# Patient Record
Sex: Male | Born: 1935 | Race: White | Hispanic: No | Marital: Married | Smoking: Former smoker
Health system: Southern US, Community
[De-identification: ages and names within clinical notes are randomized; demographics above are authoritative.]

## PROBLEM LIST (undated history)

## (undated) DIAGNOSIS — E785 Hyperlipidemia, unspecified: Secondary | ICD-10-CM

## (undated) DIAGNOSIS — I10 Essential (primary) hypertension: Secondary | ICD-10-CM

## (undated) DIAGNOSIS — K5790 Diverticulosis of intestine, part unspecified, without perforation or abscess without bleeding: Secondary | ICD-10-CM

## (undated) DIAGNOSIS — F32A Depression, unspecified: Secondary | ICD-10-CM

## (undated) DIAGNOSIS — K219 Gastro-esophageal reflux disease without esophagitis: Secondary | ICD-10-CM

## (undated) DIAGNOSIS — F329 Major depressive disorder, single episode, unspecified: Secondary | ICD-10-CM

## (undated) DIAGNOSIS — I2510 Atherosclerotic heart disease of native coronary artery without angina pectoris: Secondary | ICD-10-CM

## (undated) DIAGNOSIS — J45909 Unspecified asthma, uncomplicated: Secondary | ICD-10-CM

## (undated) DIAGNOSIS — M069 Rheumatoid arthritis, unspecified: Secondary | ICD-10-CM

## (undated) DIAGNOSIS — G259 Extrapyramidal and movement disorder, unspecified: Secondary | ICD-10-CM

## (undated) DIAGNOSIS — D649 Anemia, unspecified: Secondary | ICD-10-CM

## (undated) HISTORY — PX: CARPAL TUNNEL RELEASE: SHX101

## (undated) HISTORY — PX: KNEE SURGERY: SHX244

## (undated) HISTORY — PX: TONSILLECTOMY: SUR1361

## (undated) HISTORY — DX: Rheumatoid arthritis, unspecified: M06.9

## (undated) HISTORY — DX: Gastro-esophageal reflux disease without esophagitis: K21.9

## (undated) HISTORY — DX: Hyperlipidemia, unspecified: E78.5

## (undated) HISTORY — PX: CARDIAC CATHETERIZATION: SHX172

## (undated) HISTORY — DX: Essential (primary) hypertension: I10

## (undated) HISTORY — DX: Diverticulosis of intestine, part unspecified, without perforation or abscess without bleeding: K57.90

## (undated) HISTORY — DX: Atherosclerotic heart disease of native coronary artery without angina pectoris: I25.10

## (undated) HISTORY — DX: Anemia, unspecified: D64.9

## (undated) HISTORY — PX: TRIGGER FINGER RELEASE: SHX641

## (undated) HISTORY — DX: Extrapyramidal and movement disorder, unspecified: G25.9

## (undated) HISTORY — PX: PROSTATE SURGERY: SHX751

---

## 1991-07-15 ENCOUNTER — Encounter: Payer: Self-pay | Admitting: Internal Medicine

## 1991-07-16 ENCOUNTER — Encounter: Payer: Self-pay | Admitting: Internal Medicine

## 1993-05-29 HISTORY — PX: BACK SURGERY: SHX140

## 1994-04-05 ENCOUNTER — Encounter: Payer: Self-pay | Admitting: Internal Medicine

## 1995-05-30 HISTORY — PX: ANGIOPLASTY: SHX39

## 1998-04-01 ENCOUNTER — Other Ambulatory Visit: Admission: RE | Admit: 1998-04-01 | Discharge: 1998-04-01 | Payer: Self-pay | Admitting: Gastroenterology

## 2002-05-29 DIAGNOSIS — K5790 Diverticulosis of intestine, part unspecified, without perforation or abscess without bleeding: Secondary | ICD-10-CM

## 2002-05-29 HISTORY — DX: Diverticulosis of intestine, part unspecified, without perforation or abscess without bleeding: K57.90

## 2002-05-29 HISTORY — PX: COLONOSCOPY: SHX174

## 2002-11-27 ENCOUNTER — Encounter (INDEPENDENT_AMBULATORY_CARE_PROVIDER_SITE_OTHER): Payer: Self-pay | Admitting: Gastroenterology

## 2003-05-30 HISTORY — PX: SHOULDER SURGERY: SHX246

## 2004-04-13 ENCOUNTER — Ambulatory Visit: Payer: Self-pay | Admitting: Internal Medicine

## 2004-06-03 ENCOUNTER — Ambulatory Visit: Payer: Self-pay | Admitting: Internal Medicine

## 2004-08-31 ENCOUNTER — Ambulatory Visit: Payer: Self-pay | Admitting: Internal Medicine

## 2004-11-16 ENCOUNTER — Ambulatory Visit: Payer: Self-pay | Admitting: Internal Medicine

## 2004-12-20 ENCOUNTER — Ambulatory Visit: Payer: Self-pay | Admitting: Internal Medicine

## 2005-01-11 ENCOUNTER — Ambulatory Visit: Payer: Self-pay | Admitting: Internal Medicine

## 2005-02-13 ENCOUNTER — Ambulatory Visit: Payer: Self-pay | Admitting: Internal Medicine

## 2005-02-14 ENCOUNTER — Ambulatory Visit: Payer: Self-pay

## 2005-02-15 ENCOUNTER — Ambulatory Visit: Payer: Self-pay

## 2005-03-15 ENCOUNTER — Ambulatory Visit: Payer: Self-pay | Admitting: Internal Medicine

## 2005-03-22 ENCOUNTER — Ambulatory Visit: Payer: Self-pay | Admitting: Internal Medicine

## 2005-03-27 ENCOUNTER — Ambulatory Visit: Payer: Self-pay | Admitting: Internal Medicine

## 2005-04-11 ENCOUNTER — Ambulatory Visit: Payer: Self-pay | Admitting: Internal Medicine

## 2005-07-21 ENCOUNTER — Ambulatory Visit: Payer: Self-pay | Admitting: Internal Medicine

## 2006-01-04 ENCOUNTER — Ambulatory Visit: Payer: Self-pay | Admitting: Internal Medicine

## 2006-04-11 ENCOUNTER — Ambulatory Visit: Payer: Self-pay | Admitting: Internal Medicine

## 2006-07-24 ENCOUNTER — Ambulatory Visit: Payer: Self-pay | Admitting: Internal Medicine

## 2006-07-24 LAB — CONVERTED CEMR LAB
BUN: 14 mg/dL (ref 6–23)
Cholesterol: 137 mg/dL (ref 0–200)
Hgb A1c MFr Bld: 6.1 % — ABNORMAL HIGH (ref 4.6–6.0)
Microalb, Ur: 0.5 mg/dL (ref 0.0–1.9)
Potassium: 4.7 meq/L (ref 3.5–5.1)
VLDL: 22 mg/dL (ref 0–40)

## 2006-07-25 ENCOUNTER — Encounter (INDEPENDENT_AMBULATORY_CARE_PROVIDER_SITE_OTHER): Payer: Self-pay | Admitting: *Deleted

## 2006-12-12 ENCOUNTER — Ambulatory Visit: Payer: Self-pay | Admitting: Internal Medicine

## 2006-12-14 ENCOUNTER — Encounter (INDEPENDENT_AMBULATORY_CARE_PROVIDER_SITE_OTHER): Payer: Self-pay | Admitting: *Deleted

## 2006-12-14 LAB — CONVERTED CEMR LAB: Hgb A1c MFr Bld: 6.1 % — ABNORMAL HIGH (ref 4.6–6.0)

## 2006-12-19 ENCOUNTER — Ambulatory Visit: Payer: Self-pay | Admitting: Internal Medicine

## 2006-12-31 ENCOUNTER — Ambulatory Visit: Payer: Self-pay | Admitting: Internal Medicine

## 2007-01-31 ENCOUNTER — Ambulatory Visit: Payer: Self-pay

## 2007-07-05 ENCOUNTER — Telehealth (INDEPENDENT_AMBULATORY_CARE_PROVIDER_SITE_OTHER): Payer: Self-pay | Admitting: *Deleted

## 2007-07-22 ENCOUNTER — Encounter (INDEPENDENT_AMBULATORY_CARE_PROVIDER_SITE_OTHER): Payer: Self-pay | Admitting: *Deleted

## 2007-07-22 DIAGNOSIS — Z860100 Personal history of colon polyps, unspecified: Secondary | ICD-10-CM | POA: Insufficient documentation

## 2007-07-22 DIAGNOSIS — J309 Allergic rhinitis, unspecified: Secondary | ICD-10-CM | POA: Insufficient documentation

## 2007-07-22 DIAGNOSIS — E785 Hyperlipidemia, unspecified: Secondary | ICD-10-CM | POA: Insufficient documentation

## 2007-07-22 DIAGNOSIS — I2510 Atherosclerotic heart disease of native coronary artery without angina pectoris: Secondary | ICD-10-CM | POA: Insufficient documentation

## 2007-07-22 DIAGNOSIS — D539 Nutritional anemia, unspecified: Secondary | ICD-10-CM | POA: Insufficient documentation

## 2007-07-22 DIAGNOSIS — K219 Gastro-esophageal reflux disease without esophagitis: Secondary | ICD-10-CM | POA: Insufficient documentation

## 2007-07-22 DIAGNOSIS — M1990 Unspecified osteoarthritis, unspecified site: Secondary | ICD-10-CM | POA: Insufficient documentation

## 2007-07-22 DIAGNOSIS — J45909 Unspecified asthma, uncomplicated: Secondary | ICD-10-CM | POA: Insufficient documentation

## 2007-07-22 DIAGNOSIS — Z87898 Personal history of other specified conditions: Secondary | ICD-10-CM | POA: Insufficient documentation

## 2007-12-19 ENCOUNTER — Ambulatory Visit: Payer: Self-pay | Admitting: Internal Medicine

## 2007-12-19 ENCOUNTER — Telehealth (INDEPENDENT_AMBULATORY_CARE_PROVIDER_SITE_OTHER): Payer: Self-pay | Admitting: *Deleted

## 2007-12-19 DIAGNOSIS — G250 Essential tremor: Secondary | ICD-10-CM | POA: Insufficient documentation

## 2007-12-19 DIAGNOSIS — R7301 Impaired fasting glucose: Secondary | ICD-10-CM | POA: Insufficient documentation

## 2007-12-24 ENCOUNTER — Encounter (INDEPENDENT_AMBULATORY_CARE_PROVIDER_SITE_OTHER): Payer: Self-pay | Admitting: *Deleted

## 2007-12-24 ENCOUNTER — Ambulatory Visit: Payer: Self-pay | Admitting: Internal Medicine

## 2007-12-24 LAB — CONVERTED CEMR LAB: OCCULT 1: NEGATIVE

## 2008-03-16 ENCOUNTER — Ambulatory Visit: Payer: Self-pay | Admitting: Internal Medicine

## 2008-03-19 ENCOUNTER — Ambulatory Visit (HOSPITAL_COMMUNITY): Admission: RE | Admit: 2008-03-19 | Discharge: 2008-03-19 | Payer: Self-pay | Admitting: Internal Medicine

## 2008-04-01 ENCOUNTER — Ambulatory Visit: Payer: Self-pay | Admitting: Internal Medicine

## 2008-04-15 ENCOUNTER — Ambulatory Visit: Payer: Self-pay | Admitting: Internal Medicine

## 2008-04-15 DIAGNOSIS — R059 Cough, unspecified: Secondary | ICD-10-CM | POA: Insufficient documentation

## 2008-04-15 DIAGNOSIS — R05 Cough: Secondary | ICD-10-CM

## 2008-04-15 DIAGNOSIS — R1313 Dysphagia, pharyngeal phase: Secondary | ICD-10-CM | POA: Insufficient documentation

## 2008-04-17 ENCOUNTER — Encounter (INDEPENDENT_AMBULATORY_CARE_PROVIDER_SITE_OTHER): Payer: Self-pay | Admitting: *Deleted

## 2008-04-17 LAB — CONVERTED CEMR LAB
Basophils Absolute: 0 10*3/uL (ref 0.0–0.1)
Eosinophils Relative: 8.4 % — ABNORMAL HIGH (ref 0.0–5.0)
HCT: 38.6 % — ABNORMAL LOW (ref 39.0–52.0)
Lymphocytes Relative: 33.7 % (ref 12.0–46.0)
MCV: 94.6 fL (ref 78.0–100.0)
Monocytes Relative: 12.3 % — ABNORMAL HIGH (ref 3.0–12.0)
Neutro Abs: 2.9 10*3/uL (ref 1.4–7.7)
Platelets: 209 10*3/uL (ref 150–400)
RBC: 4.08 M/uL — ABNORMAL LOW (ref 4.22–5.81)
RDW: 12.2 % (ref 11.5–14.6)
Transferrin: 219.6 mg/dL (ref >=212.0)
WBC: 6.4 10*3/uL (ref 4.5–10.5)

## 2008-04-29 ENCOUNTER — Ambulatory Visit (HOSPITAL_COMMUNITY): Admission: RE | Admit: 2008-04-29 | Discharge: 2008-04-29 | Payer: Self-pay | Admitting: Internal Medicine

## 2008-04-29 ENCOUNTER — Encounter: Payer: Self-pay | Admitting: Internal Medicine

## 2008-05-29 DIAGNOSIS — D649 Anemia, unspecified: Secondary | ICD-10-CM

## 2008-05-29 HISTORY — DX: Anemia, unspecified: D64.9

## 2008-07-13 ENCOUNTER — Ambulatory Visit: Payer: Self-pay | Admitting: Internal Medicine

## 2008-07-13 DIAGNOSIS — F32A Depression, unspecified: Secondary | ICD-10-CM | POA: Insufficient documentation

## 2008-07-13 DIAGNOSIS — F329 Major depressive disorder, single episode, unspecified: Secondary | ICD-10-CM

## 2008-07-14 ENCOUNTER — Encounter (INDEPENDENT_AMBULATORY_CARE_PROVIDER_SITE_OTHER): Payer: Self-pay | Admitting: *Deleted

## 2008-07-15 LAB — CONVERTED CEMR LAB
Albumin: 4.3 g/dL (ref 3.5–5.2)
Cholesterol: 146 mg/dL (ref 0–200)
Free T4: 0.8 ng/dL (ref 0.6–1.6)
LDL Cholesterol: 77 mg/dL (ref 0–99)
Rhuematoid fact SerPl-aCnc: <20 intl units/mL — ABNORMAL LOW (ref 0.0–20.0)
TSH: 2.35 microintl units/mL (ref 0.35–5.50)
Total Protein: 7.6 g/dL (ref 6.0–8.3)
Uric Acid, Serum: 5.6 mg/dL (ref 4.0–7.8)
VLDL: 23 mg/dL (ref 0–40)

## 2008-07-16 ENCOUNTER — Encounter (INDEPENDENT_AMBULATORY_CARE_PROVIDER_SITE_OTHER): Payer: Self-pay | Admitting: *Deleted

## 2008-07-28 ENCOUNTER — Encounter: Payer: Self-pay | Admitting: Internal Medicine

## 2009-02-18 ENCOUNTER — Ambulatory Visit: Payer: Self-pay | Admitting: Internal Medicine

## 2009-02-19 ENCOUNTER — Encounter (INDEPENDENT_AMBULATORY_CARE_PROVIDER_SITE_OTHER): Payer: Self-pay | Admitting: *Deleted

## 2009-02-19 LAB — CONVERTED CEMR LAB: Hgb A1c MFr Bld: 6 % (ref 4.6–6.5)

## 2009-03-30 ENCOUNTER — Encounter (INDEPENDENT_AMBULATORY_CARE_PROVIDER_SITE_OTHER): Payer: Self-pay | Admitting: *Deleted

## 2009-04-02 ENCOUNTER — Telehealth (INDEPENDENT_AMBULATORY_CARE_PROVIDER_SITE_OTHER): Payer: Self-pay | Admitting: *Deleted

## 2009-04-26 ENCOUNTER — Ambulatory Visit: Payer: Self-pay | Admitting: Internal Medicine

## 2009-04-26 LAB — CONVERTED CEMR LAB
ALT: 18 units/L (ref 0–53)
AST: 21 units/L (ref 0–37)
Alkaline Phosphatase: 33 units/L — ABNORMAL LOW (ref 39–117)
Basophils Absolute: 0.1 10*3/uL (ref 0.0–0.1)
Basophils Relative: 0.9 % (ref 0.0–3.0)
Calcium: 9.4 mg/dL (ref 8.4–10.5)
Chloride: 104 meq/L (ref 96–112)
Eosinophils Relative: 7.1 % — ABNORMAL HIGH (ref 0.0–5.0)
Hemoglobin: 13 g/dL (ref 13.0–17.0)
Lymphocytes Relative: 23 % (ref 12.0–46.0)
MCV: 95.3 fL (ref 78.0–100.0)
Magnesium: 2.2 mg/dL (ref 1.5–2.5)
Monocytes Absolute: 0.8 10*3/uL (ref 0.1–1.0)
Monocytes Relative: 10 % (ref 3.0–12.0)
Potassium: 4.5 meq/L (ref 3.5–5.1)
RDW: 12 % (ref 11.5–14.6)
Sodium: 138 meq/L (ref 135–145)
Total Bilirubin: 0.9 mg/dL (ref 0.3–1.2)
Total Protein: 7.3 g/dL (ref 6.0–8.3)

## 2009-04-29 ENCOUNTER — Ambulatory Visit: Payer: Self-pay | Admitting: Internal Medicine

## 2009-04-29 DIAGNOSIS — M2550 Pain in unspecified joint: Secondary | ICD-10-CM | POA: Insufficient documentation

## 2009-04-29 DIAGNOSIS — K279 Peptic ulcer, site unspecified, unspecified as acute or chronic, without hemorrhage or perforation: Secondary | ICD-10-CM | POA: Insufficient documentation

## 2009-04-30 ENCOUNTER — Ambulatory Visit: Payer: Self-pay | Admitting: Internal Medicine

## 2009-05-03 ENCOUNTER — Encounter (INDEPENDENT_AMBULATORY_CARE_PROVIDER_SITE_OTHER): Payer: Self-pay | Admitting: *Deleted

## 2009-05-04 DIAGNOSIS — I119 Hypertensive heart disease without heart failure: Secondary | ICD-10-CM | POA: Insufficient documentation

## 2009-05-04 DIAGNOSIS — I10 Essential (primary) hypertension: Secondary | ICD-10-CM | POA: Insufficient documentation

## 2009-05-05 ENCOUNTER — Encounter (INDEPENDENT_AMBULATORY_CARE_PROVIDER_SITE_OTHER): Payer: Self-pay | Admitting: *Deleted

## 2009-05-05 ENCOUNTER — Ambulatory Visit: Payer: Self-pay | Admitting: Internal Medicine

## 2009-05-05 LAB — CONVERTED CEMR LAB
OCCULT 1: NEGATIVE
OCCULT 2: NEGATIVE
OCCULT 3: NEGATIVE

## 2009-05-06 ENCOUNTER — Ambulatory Visit: Payer: Self-pay | Admitting: Internal Medicine

## 2009-05-07 ENCOUNTER — Encounter (INDEPENDENT_AMBULATORY_CARE_PROVIDER_SITE_OTHER): Payer: Self-pay | Admitting: *Deleted

## 2009-05-07 LAB — CONVERTED CEMR LAB
Basophils Relative: 0.7 % (ref 0.0–3.0)
Eosinophils Absolute: 0.6 10*3/uL (ref 0.0–0.7)
Eosinophils Relative: 6.5 % — ABNORMAL HIGH (ref 0.0–5.0)
Folate: 19 ng/mL
Hemoglobin: 12.9 g/dL — ABNORMAL LOW (ref 13.0–17.0)
Lymphocytes Relative: 26.8 % (ref 12.0–46.0)
MCV: 95 fL (ref 78.0–100.0)
Monocytes Relative: 10.6 % (ref 3.0–12.0)
Neutrophils Relative %: 55.4 % (ref 43.0–77.0)
Platelets: 257 10*3/uL (ref 150.0–400.0)
Saturation Ratios: 26.1 % (ref 20.0–50.0)
Transferrin: 211.1 mg/dL — ABNORMAL LOW (ref 212.0–360.0)

## 2009-05-11 ENCOUNTER — Telehealth (INDEPENDENT_AMBULATORY_CARE_PROVIDER_SITE_OTHER): Payer: Self-pay | Admitting: *Deleted

## 2009-05-12 ENCOUNTER — Encounter: Payer: Self-pay | Admitting: Cardiology

## 2009-05-12 ENCOUNTER — Ambulatory Visit: Payer: Self-pay | Admitting: Internal Medicine

## 2009-05-12 ENCOUNTER — Encounter (HOSPITAL_COMMUNITY): Admission: RE | Admit: 2009-05-12 | Discharge: 2009-05-26 | Payer: Self-pay | Admitting: Internal Medicine

## 2009-05-12 ENCOUNTER — Ambulatory Visit: Payer: Self-pay

## 2009-05-29 HISTORY — PX: CATARACT EXTRACTION, BILATERAL: SHX1313

## 2009-06-22 ENCOUNTER — Ambulatory Visit: Payer: Self-pay | Admitting: Family

## 2009-06-22 DIAGNOSIS — M2550 Pain in unspecified joint: Secondary | ICD-10-CM | POA: Insufficient documentation

## 2009-06-22 LAB — CONVERTED CEMR LAB
Rhuematoid fact SerPl-aCnc: <20 intl units/mL (ref 0–20)
Sed Rate: 45 mm/hr — ABNORMAL HIGH (ref 0–16)
Uric Acid, Serum: 5 mg/dL (ref 4.0–7.8)

## 2009-06-23 ENCOUNTER — Telehealth (INDEPENDENT_AMBULATORY_CARE_PROVIDER_SITE_OTHER): Payer: Self-pay | Admitting: *Deleted

## 2009-06-23 DIAGNOSIS — M25549 Pain in joints of unspecified hand: Secondary | ICD-10-CM | POA: Insufficient documentation

## 2009-07-06 ENCOUNTER — Ambulatory Visit: Payer: Self-pay | Admitting: Internal Medicine

## 2009-07-07 ENCOUNTER — Telehealth (INDEPENDENT_AMBULATORY_CARE_PROVIDER_SITE_OTHER): Payer: Self-pay | Admitting: *Deleted

## 2009-07-08 ENCOUNTER — Telehealth (INDEPENDENT_AMBULATORY_CARE_PROVIDER_SITE_OTHER): Payer: Self-pay | Admitting: *Deleted

## 2009-07-08 LAB — CONVERTED CEMR LAB
ALT: 26 units/L (ref 0–53)
Albumin: 3.7 g/dL (ref 3.5–5.2)
Alkaline Phosphatase: 40 units/L (ref 39–117)
Total CK: 27 units/L (ref 7–232)
Total Protein: 7.3 g/dL (ref 6.0–8.3)
Vit D, 25-Hydroxy: 56 ng/mL (ref 30–89)

## 2009-07-19 ENCOUNTER — Ambulatory Visit: Payer: Self-pay | Admitting: Internal Medicine

## 2009-08-04 ENCOUNTER — Encounter: Payer: Self-pay | Admitting: Internal Medicine

## 2010-01-11 ENCOUNTER — Ambulatory Visit: Payer: Self-pay | Admitting: Internal Medicine

## 2010-01-11 DIAGNOSIS — M546 Pain in thoracic spine: Secondary | ICD-10-CM | POA: Insufficient documentation

## 2010-01-11 DIAGNOSIS — M069 Rheumatoid arthritis, unspecified: Secondary | ICD-10-CM | POA: Insufficient documentation

## 2010-01-11 DIAGNOSIS — K439 Ventral hernia without obstruction or gangrene: Secondary | ICD-10-CM | POA: Insufficient documentation

## 2010-02-10 ENCOUNTER — Encounter: Payer: Self-pay | Admitting: Internal Medicine

## 2010-06-26 LAB — CONVERTED CEMR LAB
ALT: 21 units/L (ref 0–53)
AST: 26 units/L (ref 0–37)
BUN: 18 mg/dL (ref 6–23)
Basophils Relative: 0.6 % (ref 0.0–3.0)
Bilirubin, Direct: 0.1 mg/dL (ref 0.0–0.3)
Blood in Urine, dipstick: NEGATIVE
Chloride: 104 meq/L (ref 96–112)
Cholesterol: 135 mg/dL (ref 0–200)
GFR calc Af Amer: 77 mL/min
GFR calc non Af Amer: 63 mL/min
HCT: 37.9 % — ABNORMAL LOW (ref 39.0–52.0)
Hemoglobin: 13 g/dL (ref 13.0–17.0)
Hgb A1c MFr Bld: 6.2 % — ABNORMAL HIGH (ref 4.6–6.0)
LDL Cholesterol: 67 mg/dL (ref 0–99)
MCHC: 34.3 g/dL (ref 30.0–36.0)
Microalb Creat Ratio: 2.6 mg/g (ref 0.0–30.0)
Monocytes Relative: 11.8 % (ref 3.0–12.0)
Neutrophils Relative %: 53.7 % (ref 43.0–77.0)
Platelets: 209 10*3/uL (ref 150–400)
Protein, U semiquant: NEGATIVE
RDW: 12.5 % (ref 11.5–14.6)
Specific Gravity, Urine: 1.02
Total Protein: 7 g/dL (ref 6.0–8.3)
VLDL: 21 mg/dL (ref 0–40)
WBC Urine, dipstick: NEGATIVE
WBC: 7.7 10*3/uL (ref 4.5–10.5)
pH: 6

## 2010-06-30 NOTE — Assessment & Plan Note (Signed)
Summary: FOLLOW UP/MHF   Vital Signs:  Patient profile:   76 year old male Height:      70.25 inches Weight:      196 pounds BMI:     28.02 Temp:     98.3 degrees F Pulse rate:   62 / minute Resp:     15 per minute BP sitting:   130 / 68  Vitals Entered By: rachel peeler CC: f/u-pain is worse in hands, throbbing pain from elbow to wrists, pain in left knee   Primary Care Provider:  Marga Melnick, MD  CC:  f/u-pain is worse in hands, throbbing pain from elbow to wrists, and pain in left knee.  History of Present Illness: Arthralgias since pre Christmas, initially in shoulders & L  elbow which affected ability to exercise. Progressive to  diffuse joint pains , especially hands  & wrists. Rx: Indocin from Dr Darrelyn Hillock; he was seen twice  in past 30 days. Oral steroids X 2 helped.Some associated myalgias. Labs reviewed : sed rate 45. PMH OA; his mother had Osteoarthritis also & ? "little rheumatoid".  Allergies: 1)  Sulfa 2)  Pravachol 3)  Zocor 4)  Lipitor  Review of Systems General:  Complains of sweats; denies chills and fever; No significant weight loss. Eyes:  Denies discharge, eye pain, and red eye. GU:  Denies discharge and dysuria. MS:  See HPI; Complains of joint swelling; denies joint redness, low back pain, mid back pain, and thoracic pain.  Physical Exam  General:  in no acute distress; alert,appropriate and cooperative throughout examination; mildly uncomfortable-appearing.   Extremities:  No clubbing, cyanosis, edema  noted with normal full range of motion of all joints.  DJD hand changes. Crepitus of shoulders & knees Skin:  Intact without suspicious lesions or rashes Cervical Nodes:  No lymphadenopathy noted Axillary Nodes:  No palpable lymphadenopathy   Impression & Recommendations:  Problem # 1:  PAIN IN JOINT, MULTIPLE SITES (ICD-719.49)  Orders: Venipuncture (04540) TLB-Sedimentation Rate (ESR) (85652-ESR) TLB-Hepatic/Liver Function Pnl  (80076-HEPATIC)  Problem # 2:  MYALGIA (ICD-729.1)  His updated medication list for this problem includes:    Indomethacin 25 Mg Caps (Indomethacin) .Marland Kitchen... Twice a day    Tylenol With Codeine #3 300-30 Mg Tabs (Acetaminophen-codeine) .Marland Kitchen... 1 q 6 hrs as needed pain  Orders: TLB-CK Total Only(Creatine Kinase/CPK) (82550-CK) TLB-Sedimentation Rate (ESR) (85652-ESR) T-Vitamin D (25-Hydroxy) (98119-14782) Prescription Created Electronically [phone])  Complete Medication List: 1)  Metformin Hcl 500 Mg Tb24 (Metformin hcl) .Marland Kitchen.. 1 by mouth once daily**labs due now** 2)  Paxil 20 Mg Tabs (Paroxetine hcl) .Marland Kitchen.. 1 by mouth qd 3)  Toprol Xl 25 Mg Tb24 (Metoprolol succinate) .Marland Kitchen.. 1 by mouth qd 4)  Fish Oil  5)  Gingko Biloba  6)  Asa 325mg   .... 1 by mouth qd 7)  Coq10  8)  Calcium  .Marland KitchenMarland Kitchen. 1 by mouth qd 9)  Crestor 20 Mg Tabs (Rosuvastatin calcium) .... Take 1 tablet by mouth once a day 10)  Freestyle Lancets Misc (Lancets) .... Test once daily 11)  Freestyle Test Strp (Glucose blood) .... Test once daily 12)  Multivitamins Tabs (Multiple vitamin) .... Take 1 tablet by mouth once a day 13)  Flomax 0.4 Mg Xr24h-cap (Tamsulosin hcl) .Marland Kitchen.. 1 by mouth once daily 14)  Prevacid 24hr 15 Mg Cpdr (Lansoprazole) .Marland Kitchen.. 1 by mouth once daily 15)  Prednisone 10 Mg Tabs (Prednisone) .... Take as directed 16)  Indomethacin 25 Mg Caps (Indomethacin) .... Twice a day 17)  Tylenol With Codeine #3 300-30 Mg Tabs (Acetaminophen-codeine) .Marland Kitchen.. 1 q 6 hrs as needed pain  Patient Instructions: 1)  Keep labs in home file. Prescriptions: TYLENOL WITH CODEINE #3 300-30 MG TABS (ACETAMINOPHEN-CODEINE) 1 q 6 hrs as needed pain  #30 x 1   Entered and Authorized by:   Marga Melnick MD   Signed by:   Marga Melnick MD on 07/06/2009   Method used:   Printed then faxed to ...       [facility]* (retail)       [address] [place], [place]  [address]       Ph: [phone]       Fax:  [phone]   RxID:   [id number]

## 2010-06-30 NOTE — Progress Notes (Signed)
Summary: Lab results  Phone Note Outgoing Call Call back at [facility] Phone [phone]   Call placed by: Shonna Chock,  July 08, 2009 9:54 AM Call placed to: Patient Summary of Call: Spoke with patient:  No liver or muscle issues present. Vitamin D level also normal.Sed rate (measures inflammation) is climbing . Your clinical picture suggests Polymyalgia Rheumatica( go to [facility]). Please take Prednisone as Rxed. You should see a dramatic improvement within 48-72 hrs. PLease see me 7-10 days after steroids started. Hopp  Patient ok'd information, rx sent to pharmacy, copy of labs mailed and appointment scheduled./Chrae Casper [facility]  July 08, 2009 9:55 AM

## 2010-06-30 NOTE — Letter (Signed)
Summary: [facility]   Imported By: Lanelle Bal 08/17/2009 12:56:14  _____________________________________________________________________  External Attachment:    Type:   Image     Comment:   External Document

## 2010-06-30 NOTE — Letter (Signed)
Summary: [facility]  [facility]   Imported By: Lanelle Bal 02/21/2010 09:46:29  _____________________________________________________________________  External Attachment:    Type:   Image     Comment:   External Document

## 2010-06-30 NOTE — Assessment & Plan Note (Signed)
Summary: swelling in both hands//fd   Vital Signs:  Patient profile:   76 year old male Height:      70.25 inches Weight:      197 pounds BMI:     28.17 Pulse rate:   60 / minute BP sitting:   120 / 70  Vitals Entered By: Kandice Hams (June 22, 2009 3:13 PM) CC: c/o pain both hands,swelling, left knee pain sx started right before christmas getting worse. was given prednisone  by Dr Glenetta Hew which  helped,  soon as finished sx came back   Primary Care Provider:  Marga Melnick, MD  CC:  c/o pain both hands, swelling, left knee pain sx started right before christmas getting worse. was given prednisone  by Dr Glenetta Hew which  helped, and soon as finished sx came back.  History of Present Illness: Mr Boyajian is a 76 year old male who presents with c/o pain at the base of the left thumb and wrist.  Notes that he is unable to perform fine tasks such as opening a medicine bottle when this happens.  These symptoms started before Christmas, but has worsened the last 3 weeks.  Also affected is his left shoulder and Left knee (this is his "good knee")  He underwent a 1 week prednisone treatment prescribed by Dr. Darrelyn Hillock with improvement.  However as he neared end of the taper symptoms flared back up.  Tells me that Dr Darrelyn Hillock took x-rays of his hands and told him that he had "inflamation" of the joints.  He did not have blood work performed at Dr. Jeannetta Ellis office.  Mom had RA.  Allergies: 1)  Sulfa 2)  Pravachol 3)  Zocor 4)  Lipitor  Review of Systems       Notes occasional chills, has not taken temperature.  Denies headache.    Physical Exam  General:  Well-developed,well-nourished,in no acute distress; alert,appropriate and cooperative throughout examination Extremities:  + erythema, warmth, swelling and tenderness at base of left thumb.   Psych:  Cognition and judgment appear intact. Alert and cooperative with normal attention span and concentration. No apparent delusions, illusions,  hallucinations   Impression & Recommendations:  Problem # 1:  PAIN IN JOINT, MULTIPLE SITES (ICD-719.49) Assessment Deteriorated Will plan to treat with prednisone.  Order below labs, I am suspicous for gout, however mom had RA and this is also a possibility.   Orders: T-Uric Acid (Blood) [id number]) T-Rheumatoid Factor ([phone]) T-Sed Rate (Automated) (52841-32440) Jackie Plum ([id number])  Complete Medication List: 1)  Metformin Hcl 500 Mg Tb24 (Metformin hcl) .Marland Kitchen.. 1 by mouth once daily**labs due now** 2)  Paxil 20 Mg Tabs (Paroxetine hcl) .Marland Kitchen.. 1 by mouth qd 3)  Toprol Xl 25 Mg Tb24 (Metoprolol succinate) .Marland Kitchen.. 1 by mouth qd 4)  Fish Oil  5)  Gingko Biloba  6)  Asa 325mg   .... 1 by mouth qd 7)  Coq10  8)  Calcium  .Marland KitchenMarland Kitchen. 1 by mouth qd 9)  Crestor 20 Mg Tabs (Rosuvastatin calcium) .... Take 1 tablet by mouth once a day 10)  Freestyle Lancets Misc (Lancets) .... Test once daily 11)  Freestyle Test Strp (Glucose blood) .... Test once daily 12)  Multivitamins Tabs (Multiple vitamin) .... Take 1 tablet by mouth once a day 13)  Flomax 0.4 Mg Xr24h-cap (Tamsulosin hcl) .Marland Kitchen.. 1 by mouth once daily 14)  Prevacid 24hr 15 Mg Cpdr (Lansoprazole) .Marland Kitchen.. 1 by mouth once daily 15)  Prednisone 10 Mg Tabs (Prednisone) .... Take as directed  Patient Instructions: 1)  Please complete your lab work prior to leaving today. 2)  Prednisone taper- take 4 tablets daily x 2 days, then 3 tabs daily x 2 days, then 2 tabs daily x 2 days, then 1 tab daily for 2 days then stop. 3)  Follow up in 2 weeks. Prescriptions: PREDNISONE 10 MG TABS (PREDNISONE) take as directed  #22 x 0   Entered and Authorized by:   Lemont Fillers FNP   Signed by:   Lemont Fillers FNP on 06/22/2009   Method used:   Electronically to        [facility]* (retail)       [address] [place], [place]  [address]       Ph: [phone]       Fax: [phone]   RxID:   [id number]

## 2010-06-30 NOTE — Assessment & Plan Note (Signed)
Summary: FOR BACK PAIN//PH   Vital Signs:  Patient profile:   76 year old male Height:      70.25 inches (178.44 cm) Weight:      199.50 pounds (90.68 kg) BMI:     28.52 Temp:     98.8 degrees F (37.11 degrees C) oral Pulse rate:   68 / minute Resp:     15 per minute BP sitting:   132 / 78  (left arm) Cuff size:   large  Vitals Entered By: Brenton Grills MA (January 11, 2010 11:54 AM) CC: back pain/aj, Back pain   Primary Care Provider:  Marga Melnick, MD  CC:  back pain/aj and Back pain.  History of Present Illness: Back Pain      This is a 76 year old man who presents with Back pain X several months.  The patient reports rest pain, but denies fever, chills, weakness, loss of sensation, fecal incontinence, urinary incontinence, urinary retention, dysuria, and inability to care for self.  The pain is located in the left thoracic back.  The pain began at home, gradually, and w/o injury or trigger.  The pain radiates to the  R back. The pain was initially  made worse by lying  in LLDP  and now occurs  even @ rest.   The pain is made slightly  better by acetaminophen.  Risk factors for serious underlying conditions include duration of pain > 1 month, age >= 50 years, and  immunosuppressants , MTX & corticosteroid  Rxed for RA . He sees Dr Dierdre Forth.    He is concerned as a friend who had back pain proved to have cancer. He is also concerned he may have an abdominal  hernia.  Current Medications (verified): 1)  Metformin Hcl 500 Mg  Tb24 (Metformin Hcl) .Marland Kitchen.. 1 By Mouth Once Daily 2)  Paxil 20 Mg Tabs (Paroxetine Hcl) .Marland Kitchen.. 1 By Mouth Qd 3)  Toprol Xl 25 Mg  Tb24 (Metoprolol Succinate) .Marland Kitchen.. 1 By Mouth Qd 4)  Fish Oil 5)  Gingko Biloba 6)  Asa 325mg  .... 1 By Mouth Qd 7)  Coq10 8)  Calcium .Marland Kitchen.. 1 By Mouth Qd 9)  Crestor 20 Mg  Tabs (Rosuvastatin Calcium) .... Take 1 Tablet By Mouth Once A Day 10)  Freestyle Lancets   Misc (Lancets) .... Test Once Daily 11)  Freestyle Test   Strp (Glucose  Blood) .... Test Once Daily 12)  Multivitamins   Tabs (Multiple Vitamin) .... Take 1 Tablet By Mouth Once A Day 13)  Flomax 0.4 Mg Xr24h-Cap (Tamsulosin Hcl) .Marland Kitchen.. 1 By Mouth Once Daily 14)  Prevacid 24hr 15 Mg Cpdr (Lansoprazole) .Marland Kitchen.. 1 By Mouth Once Daily 15)  Prednisone 10 Mg Tabs (Prednisone) .... 3 Once Daily 16)  Voltaren-Xr 100 Mg Xr24h-Tab (Diclofenac Sodium) .Marland Kitchen.. 1 By Mouth Once Daily 17)  Tylenol With Codeine #3 300-30 Mg Tabs (Acetaminophen-Codeine) .Marland Kitchen.. 1 Q 6 Hrs As Needed Pain 18)  Methotrexate Sodium 25 Mg/ml (Pf) Soln (Methotrexate Sodium) .Marland Kitchen.. 1 Ml Injection Once Weekly  Allergies (verified): 1)  Sulfa 2)  Pravachol 3)  Zocor 4)  Lipitor  Review of Systems General:  Complains of sweats; denies weight loss. Resp:  Denies chest pain with inspiration, coughing up blood, and sputum productive; Chronic NP cough for years, prev evaluated , ? due to ERD. GI:  Denies abdominal pain, bloody stools, dark tarry stools, and indigestion. GU:  Denies discharge and hematuria. Derm:  Denies lesion(s) and rash. Neuro:  Denies  brief paralysis, poor balance, tingling, and weakness. Heme:  Denies abnormal bruising and bleeding.  Physical Exam  General:  well-nourished,in no acute distress; alert,appropriate and cooperative throughout examination Neck:  No deformities, masses, or tenderness noted. Chest Wall:  No deformities, masses, tenderness or gynecomastia noted. Lungs:  Normal respiratory effort, chest expands symmetrically. Lungs are clear to auscultation, no crackles or wheezes. Heart:  Normal rate and regular rhythm. S1 and S2 normal without gallop, murmur, click, rub or other extra sounds. Abdomen:  Bowel sounds positive,abdomen soft and non-tender without masses, organomegaly. Ventral hernia  noted. Msk:  No deformity or scoliosis noted of thoracic or lumbar spine.   Pulses:  R and L carotid,radial,dorsalis pedis and posterior tibial pulses are full and equal  bilaterally Extremities:  No clubbing, cyanosis, edema. OA  deformities rather than RA clinically  noted with normal full range of motion of all joints.  Neg SLR  Neurologic:  alert & oriented X3, strength normal in all extremities,  heel/toegait normal, and DTRs symmetrical and 1/2+ Skin:  Intact without suspicious lesions or rashes Cervical Nodes:  No lymphadenopathy noted Axillary Nodes:  No palpable lymphadenopathy Psych:  memory intact for recent and remote, normally interactive, and good eye contact.     Impression & Recommendations:  Problem # 1:  BACK PAIN, THORACIC REGION (ICD-724.1) Tramadol not an option due to Paxil Rx His updated medication list for this problem includes:    Voltaren-xr 100 Mg Xr24h-tab (Diclofenac sodium) .Marland Kitchen... 1 by mouth once daily    Tylenol With Codeine #3 300-30 Mg Tabs (Acetaminophen-codeine) .Marland Kitchen... 1 q 6 hrs as needed pain  Orders: T-2 View CXR (71020TC) T-Thoracic Spine 2 Views ([address]UE)  Problem # 2:  VENTRAL HERNIA (ICD-553.20) Assessment: Unchanged no intervention indicated  Problem # 3:  COUGH, CHRONIC (ICD-786.2)  ? due to ERD  Orders: T-2 View CXR (71020TC)  Problem # 4:  RHEUMATOID ARTHRITIS (ICD-714.0) as per Dr Dierdre Forth  Complete Medication List: 1)  Metformin Hcl 500 Mg Tb24 (Metformin hcl) .Marland Kitchen.. 1 by mouth once daily 2)  Paxil 20 Mg Tabs (Paroxetine hcl) .Marland Kitchen.. 1 by mouth qd 3)  Toprol Xl 25 Mg Tb24 (Metoprolol succinate) .Marland Kitchen.. 1 by mouth qd 4)  Fish Oil  5)  Gingko Biloba  6)  Asa 325mg   .... 1 by mouth qd 7)  Coq10  8)  Calcium  .Marland KitchenMarland Kitchen. 1 by mouth qd 9)  Crestor 20 Mg Tabs (Rosuvastatin calcium) .... Take 1 tablet by mouth once a day 10)  Freestyle Lancets Misc (Lancets) .... Test once daily 11)  Freestyle Test Strp (Glucose blood) .... Test once daily 12)  Multivitamins Tabs (Multiple vitamin) .... Take 1 tablet by mouth once a day 13)  Flomax 0.4 Mg Xr24h-cap (Tamsulosin hcl) .Marland Kitchen.. 1 by mouth once daily 14)  Prevacid 24hr 15  Mg Cpdr (Lansoprazole) .Marland Kitchen.. 1 by mouth once daily 15)  Prednisone 10 Mg Tabs (Prednisone) .... 3 once daily 16)  Voltaren-xr 100 Mg Xr24h-tab (Diclofenac sodium) .Marland Kitchen.. 1 by mouth once daily 17)  Tylenol With Codeine #3 300-30 Mg Tabs (Acetaminophen-codeine) .Marland Kitchen.. 1 q 6 hrs as needed pain 18)  Methotrexate Sodium 25 Mg/ml (pf) Soln (Methotrexate sodium) .Marland Kitchen.. 1 ml injection once weekly  Patient Instructions: 1)  Xrays @ [address] Share these records with Dr Dierdre Forth.

## 2010-06-30 NOTE — Progress Notes (Signed)
Summary: labs  Phone Note Outgoing Call   Summary of Call: Please call Ethan Robinson and let him know that his lab work all looks ok except a general lab marker for inflamation is elevated.  I would like for him to see Dr. Amanda Pea at the same office as Dr. Darrelyn Hillock.  Dr Amanda Pea is a hand specialist and may need to aspirate the joint of his hand and send it to the lab for further evaluation to rule out gout.  We will arrange the appointment and someone will call patient with info.  Thanks Initial call taken by: Lemont Fillers FNP,  June 23, 2009 1:34 PM  Follow-up for Phone Call        spoke w/ patient aware of recommedation and that referral put to see Dr. Amanda Pea will call w/ appt information.........Marland KitchenDoristine Devoid  June 23, 2009 4:40 PM   New Problems: PAIN IN JOINT, HAND [phone])   New Problems: PAIN IN JOINT, HAND [phone])

## 2010-06-30 NOTE — Assessment & Plan Note (Signed)
Summary: Follow-up on Prednisone/scm   Vital Signs:  Patient profile:   76 year old male Weight:      193.0 pounds Temp:     98.1 degrees F oral Pulse rate:   64 / minute Resp:     16 per minute BP sitting:   140 / 78  (left arm) Cuff size:   large  Vitals Entered By: Shonna Chock (July 19, 2009 3:26 PM) CC: follow-up visit Comments REVIEWED MED LIST, PATIENT AGREED DOSE AND INSTRUCTION CORRECT    Primary Care Provider:  Marga Melnick, MD  CC:  follow-up visit.  History of Present Illness: Dramatic improvement in diffuse arthralgias  ; also improved energy & alertness. residual  pain in  wrists.His mother had OA; no definite  FH RA or PMR.  Allergies: 1)  Sulfa 2)  Pravachol 3)  Zocor 4)  Lipitor  Review of Systems General:  Complains of sweats; denies chills and fever; Weight down 5 # over 2 weeks . GI:  Denies abdominal pain, bloody stools, dark tarry stools, and indigestion. MS:  Denies joint redness, joint swelling, low back pain, mid back pain, and thoracic pain. Derm:  Denies lesion(s) and rash.  Physical Exam  General:  in no acute distress; alert,appropriate and cooperative throughout examination Abdomen:  Bowel sounds positive,abdomen soft and non-tender without masses, organomegaly or hernias noted. Extremities:  No clubbing, cyanosis, edema. DIP OA changes. Mild crepitus of shoulders Cervical Nodes:  No lymphadenopathy noted Axillary Nodes:  No palpable lymphadenopathy   Impression & Recommendations:  Problem # 1:  PAIN IN JOINT, MULTIPLE SITES (ICD-719.49)  PMR suggested clinically  Orders: Rheumatology Referral (Rheumatology) Prescription Created Electronically [phone])  Problem # 2:  HYPERGLYCEMIA, FASTING (ICD-790.29)  His updated medication list for this problem includes:    Metformin Hcl 500 Mg Tb24 (Metformin hcl) .Marland Kitchen... 1 by mouth once daily**labs due now**  Complete Medication List: 1)  Metformin Hcl 500 Mg Tb24 (Metformin hcl) .Marland Kitchen..  1 by mouth once daily**labs due now** 2)  Paxil 20 Mg Tabs (Paroxetine hcl) .Marland Kitchen.. 1 by mouth qd 3)  Toprol Xl 25 Mg Tb24 (Metoprolol succinate) .Marland Kitchen.. 1 by mouth qd 4)  Fish Oil  5)  Gingko Biloba  6)  Asa 325mg   .... 1 by mouth qd 7)  Coq10  8)  Calcium  .Marland KitchenMarland Kitchen. 1 by mouth qd 9)  Crestor 20 Mg Tabs (Rosuvastatin calcium) .... Take 1 tablet by mouth once a day 10)  Freestyle Lancets Misc (Lancets) .... Test once daily 11)  Freestyle Test Strp (Glucose blood) .... Test once daily 12)  Multivitamins Tabs (Multiple vitamin) .... Take 1 tablet by mouth once a day 13)  Flomax 0.4 Mg Xr24h-cap (Tamsulosin hcl) .Marland Kitchen.. 1 by mouth once daily 14)  Prevacid 24hr 15 Mg Cpdr (Lansoprazole) .Marland Kitchen.. 1 by mouth once daily 15)  Prednisone 10 Mg Tabs (Prednisone) .... 3 once daily 16)  Voltaren-xr 100 Mg Xr24h-tab (Diclofenac sodium) .Marland Kitchen.. 1 by mouth once daily 17)  Tylenol With Codeine #3 300-30 Mg Tabs (Acetaminophen-codeine) .Marland Kitchen.. 1 q 6 hrs as needed pain  Patient Instructions: 1)  Please schedule a follow-up lab  appointment in 2 months. 2)  HbgA1C prior to visit, ICD-9:790.29,995.20. Go to [facility] for Polymyalgia Rheumatica 3)  CBC w/ Diff prior to visit, ICD-9: 285.9 Prescriptions: PREDNISONE 10 MG TABS (PREDNISONE) 3 once daily  #90 x 0   Entered and Authorized by:   Marga Melnick MD   Signed by:   Marga Melnick  MD on 07/19/2009   Method used:   Faxed to ...       [facility]* (retail)       [address] [place], [place]  [address]       Ph: [phone]       Fax: [phone]   RxID:   [id number]

## 2010-06-30 NOTE — Progress Notes (Signed)
Summary: Medication Change Request  Phone Note Call from Patient Call back at Home Phone [phone]   Caller: Patient Summary of Call: The pharmacist recommended (Diclofenac DR 75mg )   to replace  Indomethacin 25mg  2 by mouth two times a day (Dr.Gioffrey prescribed) is not working.  [facility] [employer]   I spoke with Dr.Hopper and he ok'd change in med. 100mg  XR # 15/0 refills Initial call taken by: Shonna Chock,  July 07, 2009 3:13 PM    New/Updated Medications: VOLTAREN-XR 100 MG XR24H-TAB (DICLOFENAC SODIUM) 1 by mouth once daily Prescriptions: VOLTAREN-XR 100 MG XR24H-TAB (DICLOFENAC SODIUM) 1 by mouth once daily  #15 x 0   Entered by:   Shonna Chock   Authorized by:   Marga Melnick MD   Signed by:   Shonna Chock on 07/07/2009   Method used:   Electronically to        [facility]* (retail)       [address] [place], [place]  [address]       Ph: [phone]       Fax: [phone]   RxID:   [id number]

## 2010-07-19 ENCOUNTER — Encounter (INDEPENDENT_AMBULATORY_CARE_PROVIDER_SITE_OTHER): Payer: Self-pay | Admitting: *Deleted

## 2010-07-26 NOTE — Letter (Signed)
Summary: Primary Care Appointment Letter  [facility] [place]  [address] [place], [place] [address]   Phone: [phone]  Fax: [phone]    07/19/2010 MRN: [id number]  Ethan Robinson [address] [place], [place]  [address]  Dear [name],   Your Primary Care Physician Marga Melnick MD has indicated that:    ___X___it is time to schedule an appointment(Last chlosterol check was 2010, please call to schedule a physical and at that time all fasting labs to be done) This is necessary to continue refilling meds .    _______you missed your appointment on______ and need to call and          reschedule.    _______you need to have lab work done.    _______you need to schedule an appointment discuss lab or test results.    _______you need to call to reschedule your appointment that is                       scheduled on _________.     Please call our office as soon as possible. Our phone number is [phone]. Please press option 1. Our office is open 8a-5p, Monday through Friday.     Thank you,     Primary Care Scheduler

## 2010-08-31 ENCOUNTER — Ambulatory Visit (INDEPENDENT_AMBULATORY_CARE_PROVIDER_SITE_OTHER): Payer: Medicare Other | Admitting: Internal Medicine

## 2010-08-31 ENCOUNTER — Encounter: Payer: Self-pay | Admitting: Internal Medicine

## 2010-08-31 DIAGNOSIS — Z0000 Encounter for general adult medical examination without abnormal findings: Secondary | ICD-10-CM

## 2010-08-31 DIAGNOSIS — I2510 Atherosclerotic heart disease of native coronary artery without angina pectoris: Secondary | ICD-10-CM

## 2010-08-31 DIAGNOSIS — I10 Essential (primary) hypertension: Secondary | ICD-10-CM

## 2010-08-31 DIAGNOSIS — R7309 Other abnormal glucose: Secondary | ICD-10-CM

## 2010-08-31 DIAGNOSIS — K219 Gastro-esophageal reflux disease without esophagitis: Secondary | ICD-10-CM

## 2010-08-31 DIAGNOSIS — N400 Enlarged prostate without lower urinary tract symptoms: Secondary | ICD-10-CM

## 2010-08-31 DIAGNOSIS — M069 Rheumatoid arthritis, unspecified: Secondary | ICD-10-CM

## 2010-08-31 DIAGNOSIS — R42 Dizziness and giddiness: Secondary | ICD-10-CM

## 2010-08-31 DIAGNOSIS — E785 Hyperlipidemia, unspecified: Secondary | ICD-10-CM

## 2010-08-31 NOTE — Progress Notes (Signed)
Subjective:    Patient ID: Ethan Robinson, male    DOB: 15-May-1935, 76 y.o.   MRN: [id number]  HPI  Medicare Wellness Visit:  The following psychosocial & medical history were reviewed as required by Medicare.    social history including caffeine, alcohol,  tobacco use & exercise:2-3 cups coffee; rare alcohol;no exercise due to RA   home safety, activities of daily living, seatbelt use , and smoke alarm employment:balance issues & postural symptoms intermittently (interventions discussed);no ADL limitation;seat belt & alarms in place;    power of attorney/living will status:Status uncertain    hearing and vision evaluation ;whisper heard @ 6 ft   orientation, memory and mental health assessment :Oriented x3; memory & recall intact   ; WORLD spelled backwards ; mood /affect .   travel history, immunization status , transfusion history, and preventive health surveillance assessment:[place] 1958;shingles needed;DDS seen 2X/ year; colonoscopy not needed; no PMH  transfusions    pertinent positives and negative items include:as noted     Review of Systems Patient reports no new vision changes,anorexia, weight change, fever ,adenopathy, persistant / recurrent hoarseness, swallowing issues, chest pain, edema,persistant /  hemoptysis, dyspnea(rest, exertional, paroxysmal nocturnal), gastrointestinal  bleeding (melena, rectal bleeding), abdominal pain, excessive heart burn, GU symptoms( dysuria, hematuria, pyuria, incontinence issues) syncope, focal weakness, memory loss, skin/hair/nail changes,depression, anxiety or abnormal bruising/bleeding.  He is seeing Dr. Lynne Logan for rheumatoid arthritis. Humira  was tried;it would be prohibitively expensive to continue.  He describes a dry, nonproductive cough daily.  He has occasional palpitations.  He does have some difficulty starting stream in the mornings.He has some chronic hearing loss, especially in loud room.  He describes  "dizziness". This is related to standing and also to turning suddenly.   Objective:   Physical Exam Healthy appearing; normocephalic. Beard & moustache. Eye - Pupils Equal Round Reactive to light, Extraocular movements intact, Fundi without hemorrhage or visible lesions, Conjunctiva without redness or discharge. No nystagmus is present. Ears:  External ear exam shows no significant lesions or deformities.  Otoscopic examination reveals clear canals, tympanic membranes are intact bilaterally without bulging, retraction, inflammation or discharge.Neck:  No deformities, thyromegaly, masses, or tenderness noted.   Supple with full range of motion without pain. Heart:  Normal rate and regular rhythm. S1 and S2 normal without gallop, murmur, click, rub or other extra sounds.               Lungs:Chest clear to auscultation; no wheezes, rhonchi ,or rubs present.No increased work of breathing.? Minimal rales.Abdomen: bowel sounds normal, soft and non-tender without masses, organomegaly or hernias noted.  No guarding or rebound Extremities:  No cyanosis or edema. Mixed hand arthritic changes ;good ROM.Neurologic exam :Strength equal & normal in upper & lower extremities;DTRs WNL.Mild head tremor. Romberg testing and finger to nose testing revealed no significant deficits. Skin:  Intact without suspicious lesions or rashes . Genitourinary exam reveals vasectomy scar tissue; the prostate is normal size without nodules or induration. Psych:  Cognition and judgment appear intact. Alert, communicative  and cooperative with normal attention span and concentration. No  Cervical or axillary LA.  Assessment & Plan:  #1 Medicare wellness exam; all requirements  addressed.  #2 coronary artery disease  #3 dyslipidemia  #4 Rheumatoid Arthritis  #5 chronic cough; he states he's had a chest x-ray within the last year.  #6 fasting hyperglycemia; an A1c would be indicated to assess diabetic risk.  #7 dizziness with  suggestion of postural hypotension component and  CC orthostatic blood pressures cortices. Additionally there may be intermittent vertebrobasilar or carotid compromise by arthritic changes in his neck.  Plan: Labs will  be ordered to  assess risks listed above.          Orthostatic exercises prior to standing was stressed. If the dizziness progresses then vascular assessment of vertebrobasilar and carotid status will be pursued.

## 2010-08-31 NOTE — Patient Instructions (Addendum)
Preventive Health Care: Exercise at least 30-45 minutes a day,  3-4 days a week.  Eat a low-fat diet with lots of fruits and vegetables, up to 7-9 servings per day. Avoid obesity; your goal is waist measurement < 40 inches.Consume less than 40 grams of sugar per day from foods & drinks with High Fructose Corn Sugar as #2,3 or # 4 on label. Seatbelts can save your life. Wear them always. Smoke detectors on every level of your home, check batteries every year. Eye Doctor - have an eye exam @ least annually.                                                         Health Care Power of Attorney & Living Will. Complete if not in place ; these place you in charge of your health care decisions. Depression is common in our stressful world.If you're feeling down or losing interest in things you normally enjoy, please call .   Please do the isometric exercises prior to standing as we discussed. Please report any progression of the dizziness. Avoid sudden turns. As we discussed I am concerned that arthritic changes in neck may positionally affect circulation with certain head  position change.  Labs will be drawn to assess your medical diagnoses; please share a copy this report and the lab results with all doctors seen.

## 2010-09-01 LAB — BASIC METABOLIC PANEL
BUN: 12 mg/dL (ref 6–23)
CO2: 27 mEq/L (ref 19–32)
Calcium: 9.6 mg/dL (ref 8.4–10.5)
GFR: 88.86 mL/min (ref >60.00)
Glucose, Bld: 89 mg/dL (ref 70–99)
Potassium: 4.8 mEq/L (ref 3.5–5.1)

## 2010-09-01 LAB — CBC WITH DIFFERENTIAL/PLATELET
Basophils Relative: 0.6 % (ref 0.0–3.0)
Eosinophils Absolute: 0.8 10*3/uL — ABNORMAL HIGH (ref 0.0–0.7)
Eosinophils Relative: 10.7 % — ABNORMAL HIGH (ref 0.0–5.0)
Hemoglobin: 12.8 g/dL — ABNORMAL LOW (ref 13.0–17.0)
Lymphocytes Relative: 38.3 % (ref 12.0–46.0)
MCHC: 33.5 g/dL (ref 30.0–36.0)
Monocytes Relative: 18.1 % — ABNORMAL HIGH (ref 3.0–12.0)
Neutro Abs: 2.4 10*3/uL (ref 1.4–7.7)
Neutrophils Relative %: 32.3 % — ABNORMAL LOW (ref 43.0–77.0)
RBC: 3.83 Mil/uL — ABNORMAL LOW (ref 4.22–5.81)
WBC: 7.4 10*3/uL (ref 4.5–10.5)

## 2010-09-01 LAB — LIPID PANEL
Total CHOL/HDL Ratio: 3
VLDL: 22.6 mg/dL (ref 0.0–40.0)

## 2010-09-01 LAB — HEPATIC FUNCTION PANEL
ALT: 19 U/L (ref 0–53)
Bilirubin, Direct: 0.1 mg/dL (ref 0.0–0.3)
Total Protein: 6.9 g/dL (ref 6.0–8.3)

## 2010-09-01 LAB — PSA: PSA: 0.28 ng/mL (ref 0.10–4.00)

## 2010-09-01 LAB — HEMOGLOBIN A1C: Hgb A1c MFr Bld: 5.9 % (ref 4.6–6.5)

## 2010-09-05 ENCOUNTER — Encounter: Payer: Self-pay | Admitting: Internal Medicine

## 2010-09-12 ENCOUNTER — Other Ambulatory Visit: Payer: Self-pay | Admitting: Internal Medicine

## 2010-10-11 NOTE — Assessment & Plan Note (Signed)
[facility]                            CARDIOLOGY OFFICE NOTE   Ethan Robinson, Ethan Robinson                    MRN:          [id number]  DATE:12/31/2006                            DOB:          [birth year]    PRIMARY CARE PHYSICIAN:  Titus Dubin. Alwyn Ren, MD   INTERVAL HISTORY:  Ethan Robinson is a very pleasant 76 year old male who  returned today for routine follow-up.  He has a history of coronary  artery disease status post multiple stents over 10 years ago.  He had a  Myoview in 2006 with an EF of 60% and no ischemia or scar.  He also has  a history of hyperlipidemia, hypertension and borderline diabetes as  well as osteoarthritis with significant right knee pain.   He returns today for his yearly follow-up, and he is doing great.  He  got an injection of Symvac in his right knee several months ago and said  it felt great.  He was able to get back to his exercise program riding  on the exercise bike for at least 30 minutes every other day without any  chest pain or shortness of breath.  Unfortunately, his knee pain has now  recurred and he is limited just to his weight lifting.  He denies any  heart failure symptoms and he denied any claudications.  Blood pressure  has been well-controlled.   CURRENT MEDICATIONS:  1. Paxil 20 mg a day.  2. Aspirin 325 mg.  3. Flomax 0.4 mg.  4. Gingko biloba.  5. Crestor 10 mg a day.  6. Toprol XL 25 mg.  7. Metformin 500 mg.  8. Calcium.  9. Coenzyme Q-10.   ALLERGIES:  He is intolerant to multiple statins due to myalgia.   PHYSICAL EXAMINATION:  He is well-appearing, appears younger than his  stated age, in no acute distress.  He ambulates around the clinic  without any respiratory difficulty.  Blood pressure is 118/68, heart rate 65, weight is 196.  HEENT:  Normal.  NECK:  Supple.  There is no JVD.  Carotids are 2+ bilaterally with no  bruits.  There is no lymphadenopathy or thyromegaly.  CARDIAC:  His PMI  is nondisplaced.  He has a regular rate and rhythm.  No murmurs, rubs or gallops.  LUNGS:  Clear with mildly diminished breath sounds throughout, no  wheezing.  ABDOMEN:  Soft, nontender, nondistended, no hepatosplenomegaly, no  bruits, no masses.  Good bowel sounds.  EXTREMITIES:  Warm with no clubbing, cyanosis, or edema.  No rash.  NEUROLOGIC:  He is alert and oriented x3.  Cranial nerves II-XII are  intact.  Moves all four extremities without difficulty.  Affect is  bright.   EKG shows normal sinus rhythm with a rate of 65 with nonspecific ST-T  wave abnormalities.  No significant change from previous.   ASSESSMENT:  1. Coronary artery disease.  This is stable without any evidence of      ischemia.  He has good exercise capacity.  Should he need a knee      replacement or other knee surgery, he can  proceed without any      further cardiac workup.  2. Hypertension, well-controlled.  3. Hyperlipidemia.  This is followed by Dr. Alwyn Ren.  Given his      coronary artery disease, would titrate to keep his LDL below 70.  4. Cardiovascular screening.  We will check an abdominal ultrasound to      rule out any abdominal aortic aneurysm.   DISPOSITION:  He will follow up in 1 year.     Bevelyn Buckles. Bensimhon, MD  Electronically Signed    DRB/MedQ  DD: 12/31/2006  DT: 12/31/2006  Job #: [id number]   cc:   Titus Dubin. Alwyn Ren, MD,FACP,FCCP  Ollen Gross, M.D.

## 2010-10-11 NOTE — Assessment & Plan Note (Signed)
Ethan Robinson                            CARDIOLOGY OFFICE NOTE   Ethan Robinson, Ethan Robinson                    MRN:          [id number]  DATE:03/16/2008                            DOB:          08-10-1934    PRIMARY CARE PHYSICIAN:  Titus Dubin. Alwyn Ren, MD, FACP, FCCP   HISTORY:  Ethan Robinson is a very pleasant 76 year old male with history of  coronary artery disease, status post multiple stents over 10 years ago.  He had a Myoview in 2006 with EF of 60% with no ischemia or scar.  He  also has history of hyperlipidemia, hypertension, and borderline  diabetes.   He returns today for routine followup.  From a cardiac point of view, he  is doing great.  His mobility is somewhat limited by significant  osteoarthritis and he has been receiving knee shots, however, he does  continue to walk on the treadmill for 20 minutes 3 times a week without  any difficulty.   His only real complaint today is the fact that he often find himself  choking on his saliva.  He says this has been going on for years, but it  really seems to be getting worse.  He says it feels like it is going  down the long pipe.  He also says that sometimes when he eats, he feels  the food goes down the long pipe and he develops cough.  He has not had  a formal workup of this at all.   REVIEW OF SYSTEMS:  As per above, otherwise all systems normal.   CURRENT MEDICATIONS:  1. Paxil 20 mg a day.  2. Aspirin 325 a day.  3. Flomax 0.4 a day.  4. Ginkgo biloba.  5. Fish oil 4 g a day.  6. Toprol-XL 25 a day.  7. Metformin 500 a day.  8. Coenzyme Q10.  9. Crestor 20 a day.  10.Calcium.   ALLERGIES:  He is intolerant to multiple STATINS due to myalgias, but  seems to be tolerating Crestor.   PHYSICAL EXAMINATION:  GENERAL:  He is well-appearing and in no acute  distress.  He is younger than his stated age and ambulatory in the  clinic without respiratory difficulty.  VITAL SIGNS:  Blood pressure  is 128/60, heart rate 52, weight is 207.  HEENT:  Normal.  Oropharynx is clear.  NECK:  Supple.  No JVD.  Carotids are 2+ bilaterally without bruits.  There is no lymphadenopathy or thyromegaly.  CARDIAC:  PMI is nondisplaced.  He is bradycardic and regular with no  murmurs, rubs, or gallops.  LUNGS:  Clear with mildly diminished breath sounds throughout.  No  wheezing.  ABDOMEN:  Soft, nontender, nondistended.  No hepatosplenomegaly.  No  bruits.  No masses.  Good bowel sounds.  EXTREMITIES:  Warm with no cyanosis, clubbing, or edema.  No rash.  NEUROLOGIC:  Alert and oriented x3.  Cranial nerves II through XII are  intact.  Moves all fours without difficulty.  Affect is pleasant.   EKG shows sinus bradycardia at a rate of 52 with borderline low voltage.  ASSESSMENT/PLAN:  1. Coronary artery disease is stable.  No evidence of ischemia.      Continue current therapy.  2. Hypertension, well controlled.  3. Hyperlipidemia.  It is followed by Dr. Alwyn Ren.  Goal LDL is less      than 70.  Continue statin as tolerated.  4. Choking on saliva.  I am concerned for a possible aspiration.  We      will order a barium swallow and have him follow up with GI.  I will      also start him on Prilosec 20 mg a day.   We will see him back for cardiac followup in 1 year.     Bevelyn Buckles. Bensimhon, MD  Electronically Signed    DRB/MedQ  DD: 03/16/2008  DT: 03/17/2008  Job #: [id number]   cc:   Titus Dubin. Alwyn Ren, MD,FACP,FCCP

## 2010-10-14 NOTE — Assessment & Plan Note (Signed)
[facility]                              CARDIOLOGY OFFICE NOTE   REY, DANSBY                    MRN:          [id number]  DATE:01/04/2006                            DOB:          03/09/1935    REFERRING PHYSICIAN:  Titus Dubin. Alwyn Ren, MD, FCCP   PATIENT IDENTIFICATION:  This patient is a very pleasant, 76 year old male  who returns for routine followup here.   PROBLEM LIST:  1. Coronary artery disease.      a.     Status post three stents to his coronary arteries approximately       10 years ago.  Details unavailable.      b.     Preoperative adenosine Myoview 2006 with EF of 60%, no ischemia       or scar.  2. Hyperlipidemia followed by Dr. Alwyn Ren.  3. Borderline diabetes.  4. Osteoarthritis with significant right knee pain.  5. Gastroesophageal reflux disease.  6. Benign prostatic hypertrophy.  7. History of asthma.   CURRENT MEDICATIONS:  1. Crestor 10 a day.  2. Paxil 20.  3. Aspirin 325.  4. Flomax.  5. Co-Q-10.  6. Multivitamin.  7. Gingko biloba.  8. Fish oil.  9. Toprol-XL 25.   ALLERGIES:  HE IS INTOLERANT TO MULTIPLE STATINS DUE TO MYALGIAS.   INTERVAL HISTORY:  The patient returns today for routine followup.  He is  doing very well.  He goes to the gym about 3 times a week and rides the bike  and lifts some weights.  However, his aerobic exercise is limited by his  severe right knee pain.  He denies any chest pain or shortness of breath.  He has occasional skipped beats when he drinks caffeine but no sustained  palpitations, no syncope or presyncope, no heart failure symptoms.   PHYSICAL EXAMINATION:  GENERAL:  He is well-appearing, muscular, in no acute  distress.  Ambulates around the clinic without any difficulty.  VITAL SIGNS:  Blood pressure is 122/64 manually in the left arm.  Heart rate  is 54.  HEENT:  Sclerae anicteric.  EOMI.  He has some scattered xanthelasmas.  NECK:  Supple.  There is no JVD.   Carotids are 2+ bilaterally without any  bruits.  CARDIAC:  He is bradycardic and regular.  No murmurs, rubs or gallops.  LUNGS:  The lungs are clear with a slightly prolonged expiratory phase.  No  wheezing.  ABDOMEN:  Soft.  Nontender.  Nondistended.  No hepatosplenomegaly.  No  bruits.  No masses.  EXTREMITIES:  Warm with no cyanosis, clubbing or edema.  Distal pulses are  1+ bilaterally.  NEUROLOGIC:  He is alert and oriented times 3.  Cranial nerves II-XII are  intact.  Moves all four extremities without difficulty.  Affect is bright.   ASSESSMENT AND PLAN:  1. Coronary artery disease has remained stable without any evidence of      ischemia.  We will continue his current medical regimen.  2. Hyperlipidemia.  This is followed by Dr. Alwyn Ren.  Given his coronary      artery  disease and possible diabetes, I have asked that his statin be      titrated or add Zetia to keep his LDL under 70.  3. Right knee pain.  I have told him if this is truly limiting to his      lifestyle and exercise capacity, would consider possible knee      replacement.  He will follow up with [facility].   DISPOSITION:  Return to clinic in 1 year for routine followup.                                Bevelyn Buckles. Bensimhon, MD    DRB/MedQ  DD:  01/04/2006  DT:  01/04/2006  Job #:  [id number]   cc:   Titus Dubin. Alwyn Ren, MD, FCCP

## 2010-11-02 ENCOUNTER — Telehealth: Payer: Self-pay | Admitting: Internal Medicine

## 2010-11-02 MED ORDER — ZOSTER VACCINE LIVE 19400 UNT/0.65ML ~~LOC~~ SOLR: 0.6500 mL | Freq: Once | SUBCUTANEOUS | Status: DC

## 2010-11-02 NOTE — Telephone Encounter (Signed)
rx sent to pharmacy

## 2010-11-02 NOTE — Telephone Encounter (Signed)
Patient wants rx for shingles vac faxed to [facility]

## 2010-11-06 IMAGING — CR DG CHEST 2V
2 series · 2 of 2 positions shown · non-contrast
Comparison: Chest 10/08/1998.

CLINICAL DATA: Cough.

CHEST - 2 VIEW

[view not recorded (1 of 2)]
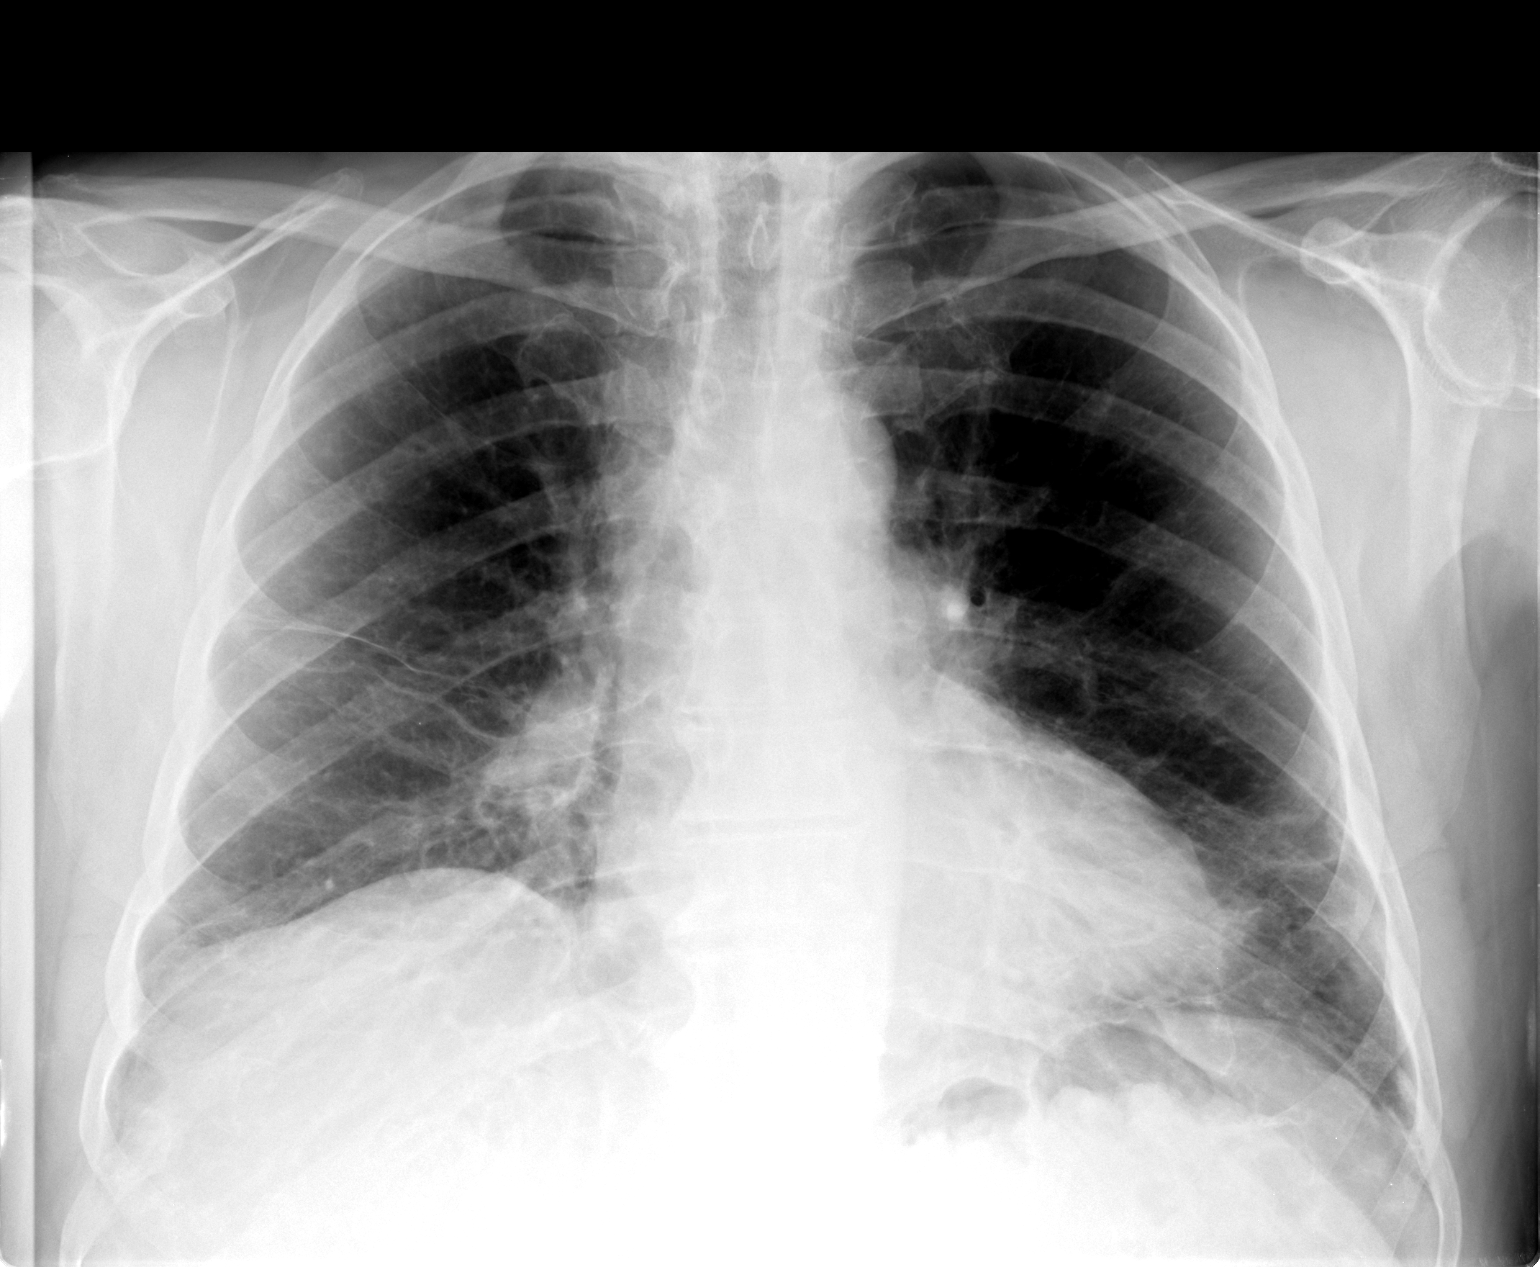

[view not recorded (2 of 2)]
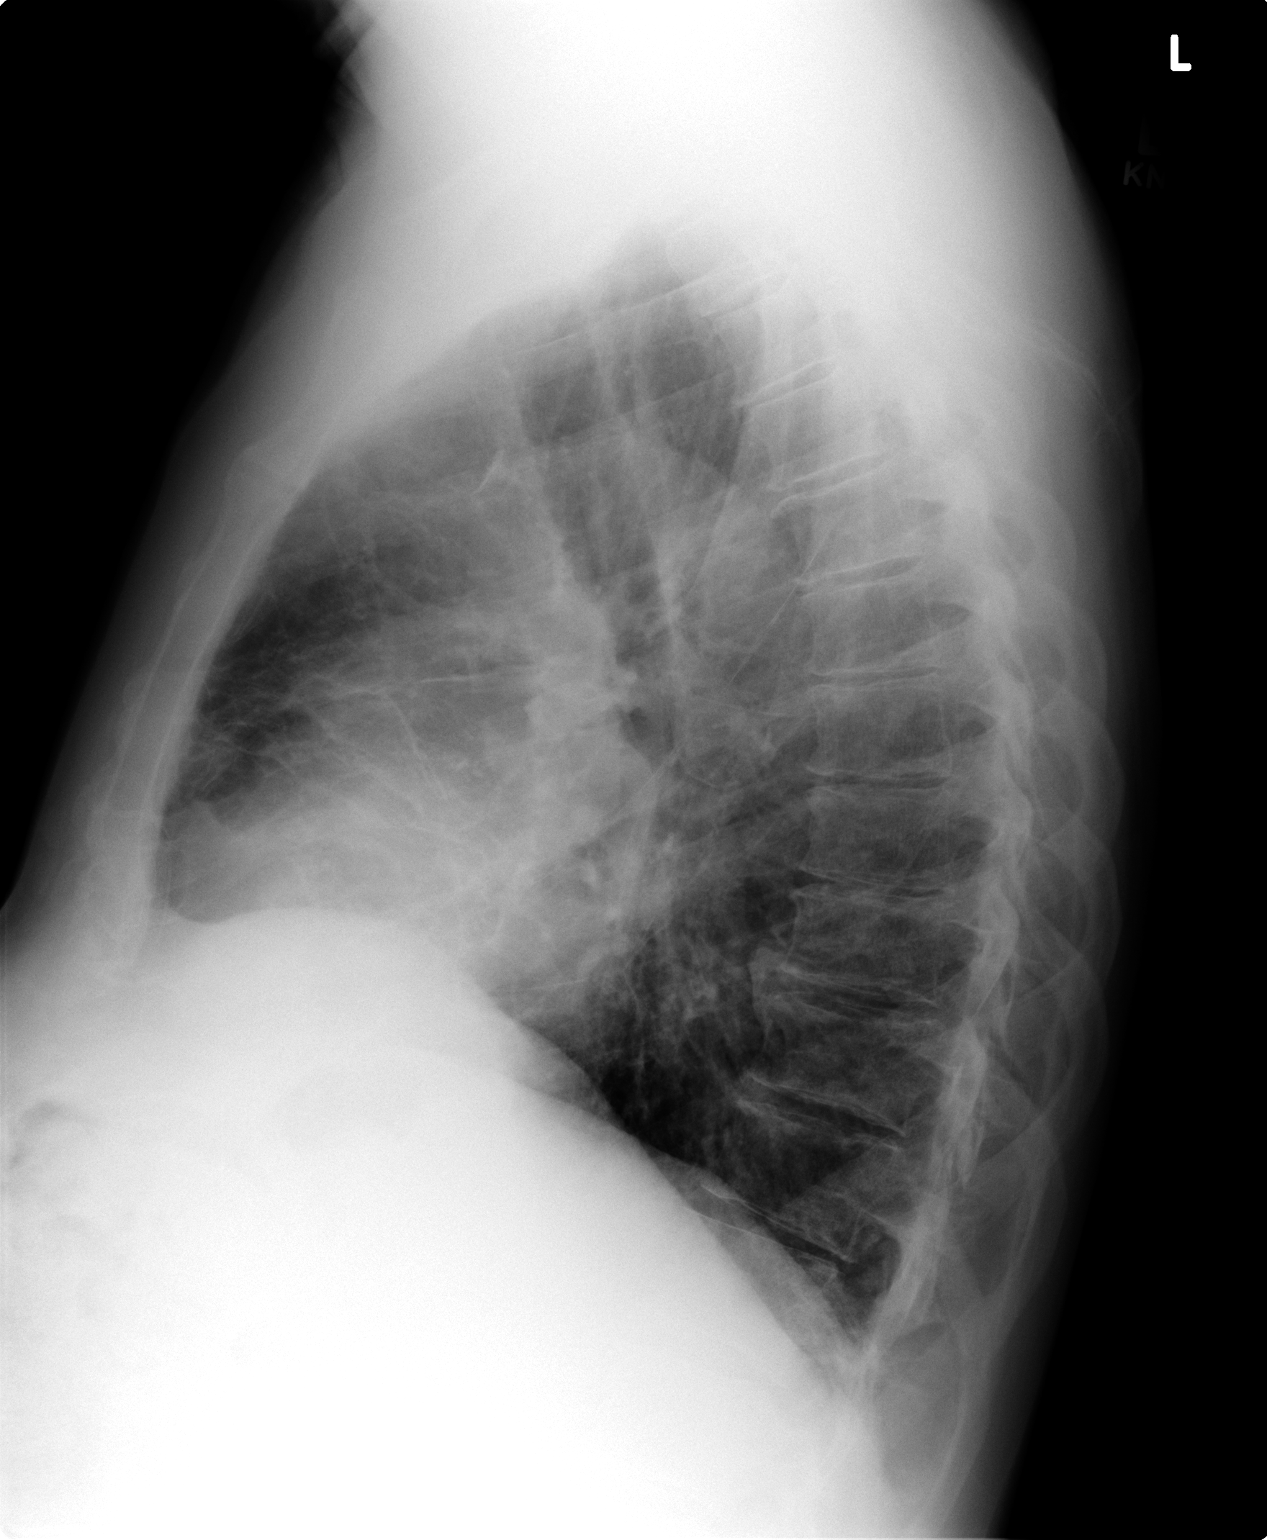

[2 of 2 positions shown; findings below may reference images not displayed]

FINDINGS: There is bibasilar scarring which appears unchanged.
No new airspace disease.  No pleural effusion.  Heart size upper
normal.
IMPRESSION: No acute finding in patient with bilateral pulmonary scarring.

## 2010-11-08 ENCOUNTER — Other Ambulatory Visit: Payer: Self-pay | Admitting: Internal Medicine

## 2011-01-10 ENCOUNTER — Other Ambulatory Visit: Payer: Self-pay | Admitting: Internal Medicine

## 2011-03-15 ENCOUNTER — Other Ambulatory Visit: Payer: Self-pay | Admitting: Internal Medicine

## 2011-06-02 ENCOUNTER — Other Ambulatory Visit: Payer: Self-pay | Admitting: Orthopedic Surgery

## 2011-06-09 ENCOUNTER — Telehealth: Payer: Self-pay | Admitting: Internal Medicine

## 2011-06-09 NOTE — Telephone Encounter (Signed)
New msg Pt said he need to get surgical clearance from Dr bensimhon he is having knee replacement.. He says it has been over a year since he has been seen. Please call

## 2011-06-09 NOTE — Telephone Encounter (Signed)
Transferred to you the pt is scheduled for surgery in March 2013

## 2011-06-12 NOTE — Telephone Encounter (Signed)
Follow-up:    Patient called in to follow up on his initial call to schedule an Surgical Clearance OV with Dr. Gala Romney.  Still unsure if the patient is eligible to be seen by Dr. Gala Romney. Please call back.

## 2011-06-12 NOTE — Telephone Encounter (Signed)
I talked with pt . Pt scheduled  for surgery March 2013. Pt transferred to scheduler to schedule an appt with Dr Gala Romney prior to surgery in March.

## 2011-06-13 ENCOUNTER — Encounter: Payer: Self-pay | Admitting: Internal Medicine

## 2011-06-13 ENCOUNTER — Ambulatory Visit (INDEPENDENT_AMBULATORY_CARE_PROVIDER_SITE_OTHER): Payer: Medicare Other | Admitting: Internal Medicine

## 2011-06-13 DIAGNOSIS — T887XXA Unspecified adverse effect of drug or medicament, initial encounter: Secondary | ICD-10-CM

## 2011-06-13 DIAGNOSIS — M069 Rheumatoid arthritis, unspecified: Secondary | ICD-10-CM

## 2011-06-13 DIAGNOSIS — M1711 Unilateral primary osteoarthritis, right knee: Secondary | ICD-10-CM

## 2011-06-13 DIAGNOSIS — I10 Essential (primary) hypertension: Secondary | ICD-10-CM

## 2011-06-13 DIAGNOSIS — R7309 Other abnormal glucose: Secondary | ICD-10-CM

## 2011-06-13 DIAGNOSIS — E785 Hyperlipidemia, unspecified: Secondary | ICD-10-CM

## 2011-06-13 DIAGNOSIS — IMO0002 Reserved for concepts with insufficient information to code with codable children: Secondary | ICD-10-CM

## 2011-06-13 DIAGNOSIS — M1710 Unilateral primary osteoarthritis, unspecified knee: Secondary | ICD-10-CM

## 2011-06-13 LAB — HEMOGLOBIN A1C: Hgb A1c MFr Bld: 5.5 % (ref 4.6–6.5)

## 2011-06-13 LAB — LIPID PANEL
Cholesterol: 150 mg/dL (ref 0–200)
Triglycerides: 101 mg/dL (ref 0.0–149.0)

## 2011-06-13 NOTE — Assessment & Plan Note (Signed)
A1c will be checked

## 2011-06-13 NOTE — Patient Instructions (Signed)
Blood Pressure Goal  Ideally is an AVERAGE < 135/85. This AVERAGE should be calculated from @ least 5-7 BP readings taken @ different times of day on different days of week. You should not respond to isolated BP readings , but rather the AVERAGE for that week  

## 2011-06-13 NOTE — Progress Notes (Signed)
  Subjective:    Patient ID: Ethan Robinson, male    DOB: 01-01-1935, 77 y.o.   MRN: [id number]  HPI Pre op evaluation: Surgical diagnosis:DJD of R knee Tentative surgical date/Surgeon:07/28/2011/ Dr Despina Hick Severity:up to 8 climbing stairs Pain: sharp Activity of daily living limitation/impairment of function:can't climb stairs or ambulate @fast  rate Treatment to date, efficacy: Synvisc injections ( 2 courses) & steroid injections with temporary benfit Significant past medical history: hyperglycemia, dyslipidemia; hypertension; coronary disease; PUD. Cardiology appt pending   Review of Systems HYPERTENSION: Disease Monitoring: Blood pressure range-not monitored Chest pain, palpitations- no       Dyspnea-no Medications: Compliance- yes Lightheadedness,Syncope- no    Edema- occasionally  Fastig Hyperglycemia: Disease Monitoring: Blood Sugar ranges-A1c 5.9 % in 4/12  Polyuria/phagia/dipsia- no      Visual problems-no Medications: Compliance- no   HYPERLIPIDEMIA: Disease Monitoring: See symptoms for Hypertension Medications: Compliance- yes  Abd pain, bowel changes- no except occasional constipation  Muscle aches- no  His rheumatologist does bloodwork every 3 months to monitor MTX, Plaquenil, & Sulfasalazine. Labs were Robinson last month. These will need to be reviewed preop          Objective:   Physical Exam He appears healthy and well-nourished; he is in no acute distress  No carotid bruits are present.  Heart rhythm and rate are normal with no  gallops.Grade 1/6 systolic murmur   Chest is clear with no increased work of breathing  There is no evidence of aortic aneurysm or renal artery bruits  He has no clubbing or edema. DIP changes are noticed in his hands. Crepitus is present in the right knee. There is a fusiform change in the left knee with slight instability. Range of motion is surprisingly good.   Pedal pulses are intact   No ischemic skin changes are  present         Assessment & Plan:   #1 degenerative joint disease of the knee with failed therapy to steroid injections and Synvisc. Limitations of activities of daily living as noted.  Clearance for surgery will require review of labs and also Cardiology reassessment as he has not been seen for over a year.

## 2011-06-13 NOTE — Assessment & Plan Note (Signed)
Blood pressure monitor is essential. Goals discussed

## 2011-06-13 NOTE — Assessment & Plan Note (Signed)
Fasting lipids are indicated

## 2011-06-13 NOTE — Assessment & Plan Note (Signed)
I presume that his rheumatologist checked renal function, hepatic enzymes, and CBC and differential. Results will have to be verified and reviewed

## 2011-06-19 ENCOUNTER — Encounter: Payer: Self-pay | Admitting: *Deleted

## 2011-06-21 ENCOUNTER — Other Ambulatory Visit: Payer: Self-pay | Admitting: Internal Medicine

## 2011-06-27 ENCOUNTER — Encounter: Payer: Self-pay | Admitting: Physician Assistant

## 2011-06-27 ENCOUNTER — Ambulatory Visit (INDEPENDENT_AMBULATORY_CARE_PROVIDER_SITE_OTHER): Payer: Medicare Other | Admitting: Physician Assistant

## 2011-06-27 DIAGNOSIS — Z01810 Encounter for preprocedural cardiovascular examination: Secondary | ICD-10-CM

## 2011-06-27 DIAGNOSIS — E785 Hyperlipidemia, unspecified: Secondary | ICD-10-CM

## 2011-06-27 DIAGNOSIS — I10 Essential (primary) hypertension: Secondary | ICD-10-CM

## 2011-06-27 DIAGNOSIS — M069 Rheumatoid arthritis, unspecified: Secondary | ICD-10-CM

## 2011-06-27 DIAGNOSIS — I2510 Atherosclerotic heart disease of native coronary artery without angina pectoris: Secondary | ICD-10-CM

## 2011-06-27 NOTE — Progress Notes (Signed)
[address] [place], [place]  [address] Phone: [phone] Fax:  [phone]  Date:  06/27/2011   Name:  Ethan Robinson       DOB:  06-Mar-1935 MRN:  [id number]  PCP:  Dr. Alwyn Ren Primary Cardiologist:  Dr. Arvilla Meres  Primary Electrophysiologist:  None    History of Present Illness: Ethan Robinson is a 77 y.o. male who presents for surgical clearance.  He needs a right TKR with Dr. Lequita Halt.    He has a history of CAD, status post multiple stents to circa 1997 (details unavailable), hypertension, hyperlipidemia, borderline diabetes.  Last seen by Dr. Arvilla Meres in 12/10.  Myoview at that time: EF 66%, lateral ischemia (low risk study).  At that time, Dr. Gala Romney cleared him for surgery.  He has not been seen since.  Never had surgery back in 2010.  The patient denies chest pain, shortness of breath, syncope, orthopnea, PND or significant pedal edema.  However, he is limited by his RA.  He has not worked out at [employer] in over a year.  He cannot achieve 4 METs.    Past Medical History  Diagnosis Date  . Diverticulosis 2004    [facility]; due 2014  . Other and unspecified hyperlipidemia   . CAD (coronary artery disease)     Dr Gala Romney  . GERD (gastroesophageal reflux disease)   . RA (rheumatoid arthritis)     Dr Dierdre Forth  . Uncontrolled hypertension as indication for native nephrectomy   . Anemia 2010    H/H  12.9/38    Current Outpatient Prescriptions  Medication Sig Dispense Refill  . aspirin 325 MG tablet Take 325 mg by mouth daily.        . Calcium Carbonate (CALCIUM 600 PO) Take 600 mg by mouth.        . Coenzyme Q10 (COQ10) 100 MG CAPS Take 100 mg by mouth daily.        . Ginkgo Biloba 40 MG TABS Take 40 mg by mouth 2 (two) times daily.       . hydroxychloroquine (PLAQUENIL) 200 MG tablet Take 200 mg by mouth 2 (two) times daily.      . methotrexate 1 G injection Inject into the vein. Once weekly       . metoprolol  succinate (TOPROL-XL) 25 MG 24 hr tablet TAKE ONE TABLET BY MOUTH EVERY DAY  90 tablet  1  . Multiple Vitamin (MULTIVITAMIN) tablet Take 1 tablet by mouth daily.        . Omega-3 Fatty Acids (FISH OIL) 1000 MG CAPS Take 1,400 mg by mouth daily.       Marland Kitchen PARoxetine (PAXIL) 20 MG tablet TAKE ONE TABLET BY MOUTH EVERY DAY  30 tablet  5  . rosuvastatin (CRESTOR) 20 MG tablet Take 1 tablet (20 mg total) by mouth daily.  30 each  11  . sulfaSALAzine (AZULFIDINE) 500 MG tablet Take 500 mg by mouth. 2 by mouth two times daily       . Tamsulosin HCl (FLOMAX) 0.4 MG CAPS TAKE ONE CAPSULE BY MOUTH EVERY DAY  30 capsule  11  . glucose blood (FREESTYLE TEST STRIPS) test strip 1 each by Other route as needed. Use as instructed       . Lancets (FREESTYLE) lancets 1 each by Other route as needed. Use as instructed         Allergies: Allergies  Allergen Reactions  . Atorvastatin   .  Pravastatin Sodium   . Simvastatin   . Sulfonamide Derivatives     History  Substance Use Topics  . Smoking status: Former Smoker -- 1.5 packs/day    Quit date: 05/29/1972  . Smokeless tobacco: Not on file  . Alcohol Use: Yes     beer rarely     ROS:  Please see the history of present illness.   All other systems reviewed and negative.   PHYSICAL EXAM: VS:  BP 130/70  Pulse 57  Resp 18  Ht 5\' 10"  (1.778 m)  Wt 196 lb 12.8 oz (89.268 kg)  BMI 28.24 kg/m2 Well nourished, well developed, in no acute distress HEENT: normal Neck: no JVD Endo: no thyromegaly Vasc: no carotid bruits Cardiac:  normal S1, S2; RRR; no murmur Lungs:  clear to auscultation bilaterally, no wheezing, rhonchi or rales Abd: soft, nontender, no hepatomegaly Ext: no edema Skin: warm and dry Neuro:  CNs 2-12 intact, no focal abnormalities noted  EKG:  Sinus bradycardia, heart rate 57, left axis deviation, nonspecific ST-T wave changes, no change from prior  ASSESSMENT AND PLAN:

## 2011-06-27 NOTE — Assessment & Plan Note (Signed)
Controlled.  Continue current therapy.  

## 2011-06-27 NOTE — Assessment & Plan Note (Signed)
He is not having angina.  However, he cannot achieve 4 METs.  It has been over 2 years since his last stress test.  He has significant risk factors including known CAD, HTN, HLP and RA.  Proceed with Lexiscan Myoview.  If remains low risk, he can proceed with surgery at acceptable risk.  Continue ASA and statin.  Follow up with Dr. Arvilla Meres in 6 mos (or sooner if Celine Ahr is abnormal).

## 2011-06-27 NOTE — Assessment & Plan Note (Signed)
Managed by PCP

## 2011-06-27 NOTE — Assessment & Plan Note (Signed)
Pending TKR with Dr. Lequita Halt.

## 2011-06-27 NOTE — Patient Instructions (Signed)
Your physician has requested that you have a lexiscan myoview DX V72.81 SURG CLEARANCE, 414.01 CAD. For further information please visit [email or web address] Please follow instruction sheet, as given.  Your physician wants you to follow-up in: 6 MONTH WITH DR. Gala Romney. You will receive a reminder letter in the mail two months in advance. If you don't receive a letter, please call our office to schedule the follow-up appointment.

## 2011-06-27 NOTE — Assessment & Plan Note (Signed)
As noted, proceed with myoview prior to clearing.

## 2011-07-05 ENCOUNTER — Ambulatory Visit (HOSPITAL_COMMUNITY): Payer: Medicare Other | Attending: Cardiovascular Disease | Admitting: Radiology

## 2011-07-05 DIAGNOSIS — Z87891 Personal history of nicotine dependence: Secondary | ICD-10-CM | POA: Insufficient documentation

## 2011-07-05 DIAGNOSIS — I10 Essential (primary) hypertension: Secondary | ICD-10-CM

## 2011-07-05 DIAGNOSIS — E785 Hyperlipidemia, unspecified: Secondary | ICD-10-CM | POA: Insufficient documentation

## 2011-07-05 DIAGNOSIS — J45909 Unspecified asthma, uncomplicated: Secondary | ICD-10-CM | POA: Insufficient documentation

## 2011-07-05 DIAGNOSIS — R002 Palpitations: Secondary | ICD-10-CM | POA: Insufficient documentation

## 2011-07-05 DIAGNOSIS — I2510 Atherosclerotic heart disease of native coronary artery without angina pectoris: Secondary | ICD-10-CM

## 2011-07-05 DIAGNOSIS — Z130 Encounter for screening for diseases of the blood and blood-forming organs and certain disorders involving the immune mechanism: Secondary | ICD-10-CM | POA: Insufficient documentation

## 2011-07-05 DIAGNOSIS — Z01810 Encounter for preprocedural cardiovascular examination: Secondary | ICD-10-CM

## 2011-07-05 MED ORDER — REGADENOSON 0.4 MG/5ML IV SOLN
0.4000 mg | Freq: Once | INTRAVENOUS | Status: AC
  Administered 2011-07-05: 0.4 mg via INTRAVENOUS

## 2011-07-05 MED ORDER — TECHNETIUM TC 99M TETROFOSMIN IV KIT
11.0000 | PACK | Freq: Once | INTRAVENOUS | Status: AC | PRN
  Administered 2011-07-05: 11 via INTRAVENOUS

## 2011-07-05 MED ORDER — TECHNETIUM TC 99M TETROFOSMIN IV KIT
33.0000 | PACK | Freq: Once | INTRAVENOUS | Status: AC | PRN
  Administered 2011-07-05: 33 via INTRAVENOUS

## 2011-07-05 NOTE — Progress Notes (Signed)
[facility] SITE 3 NUCLEAR MED [address] [place] [address] [phone]  Cardiology Nuclear Med Study  Ethan Robinson is a 76 y.o. male [id number] April 29, 1935   Nuclear Med Background Indication for Stress Test:  Evaluation for Ischemia; Stent Patency and Surgical Clearance for (R) TKR on 08/07/11 by Dr.  Ollen Gross History:  Asthma and '97 Stents; '10 WNU:UVOZ lateral ischemia, EF=66%, medical tx. Cardiac Risk Factors: History of Smoking, Hypertension and Lipids  Symptoms:  Palpitations   Nuclear Pre-Procedure Caffeine/Decaff Intake:  None NPO After: 7:00pm   Lungs:  Clear.  O2 SAT 98% on RA. IV 0.9% NS with Angio Cath:  20g  IV Site: R Antecubital  IV Started by:  Stanton Kidney, EMT-P  Chest Size (in):  42 Cup Size: n/a  Height: 5' 10.5" (1.791 m)  Weight:  195 lb (88.451 kg)  BMI:  Body mass index is 27.58 kg/(m^2). Tech Comments:  Toprol held > 24 hours, per patient.    Nuclear Med Study 1 or 2 day study: 1 day  Stress Test Type:  Lexiscan  Reading MD: Charlton Haws, MD  Order Authorizing Provider:  Arvilla Meres, MD; Tereso Newcomer, PA-C  Resting Radionuclide: Technetium 73m Tetrofosmin  Resting Radionuclide Dose: 11 mCi   Stress Radionuclide:  Technetium 65m Tetrofosmin  Stress Radionuclide Dose: 33 mCi           Stress Protocol Rest HR: 61 Stress HR: 82  Rest BP: 118/82 Stress BP: 120/70  Exercise Time (min): n/a METS: n/a   Predicted Max HR: 146 bpm % Max HR: 56.16 bpm Rate Pressure Product: 9840   Dose of Adenosine (mg):  n/a Dose of Lexiscan: 0.4 mg  Dose of Atropine (mg): n/a Dose of Dobutamine: n/a mcg/kg/min (at max HR)  Stress Test Technologist: Smiley Houseman, CMA-N  Nuclear Technologist:  Domenic Polite, CNMT     Rest Procedure:  Myocardial perfusion imaging was performed at rest 45 minutes following the intravenous administration of Technetium 75m Tetrofosmin.  Rest ECG: IRBBB  Stress Procedure:  The patient  received IV Lexiscan 0.4 mg over 15-seconds.  Technetium 25m Tetrofosmin injected at 30-seconds.  There were no significant changes with Lexiscan.  Quantitative spect images were obtained after a 45 minute delay.  Stress ECG: No significant change from baseline ECG  QPS Raw Data Images:  Normal; no motion artifact; normal heart/lung ratio. Stress Images:  There is decreased uptake in the anterior wall. Rest Images:  There is decreased uptake in the anterior wall. Subtraction (SDS):  These findings are consistent with ischemia. Transient Ischemic Dilatation (Normal <1.22):  1.10 Lung/Heart Ratio (Normal <0.45):  .37  Quantitative Gated Spect Images QGS EDV:  115 ml QGS ESV:  45 ml QGS cine images:  NL LV Function; NL Wall Motion QGS EF: 61%  Impression Exercise Capacity:  Lexiscan with no exercise. BP Response:  Normal blood pressure response. Clinical Symptoms:  There is dyspnea. ECG Impression:  No significant ST segment change suggestive of ischemia. Comparison with Prior Nuclear Study: No images to compare  Overall Impression:  Small area of mild apical ischemia.  SDS 3 at apex     [facility]

## 2011-07-12 ENCOUNTER — Telehealth: Payer: Self-pay | Admitting: *Deleted

## 2011-07-12 NOTE — Telephone Encounter (Signed)
Pt notified and given myoview results today with verbal understanding. I told pt that per Tereso Newcomer, PA-C I will fax results to surgeon Dr. Berton Lan today, pt gave verbal understanding to this as well today. Danielle Rankin

## 2011-07-12 NOTE — Telephone Encounter (Signed)
Message copied by Tarri Fuller on Wed Jul 12, 2011  9:20 AM ------      Message from: [place], [place] T      Created: Wed Jul 12, 2011  8:21 AM       Reviewed nuclear images with Dr. Charlton Haws from 2013 and 2010.         Essentially unchanged.      Low risk scan.         Normal EF.      Ok to proceed with surgery at acceptable CV risk.      Continue beta blocker throughout perioperative period.      Our service available as necessary.      Please fax copy to surgeon.      Tereso Newcomer, PA-C  8:19 AM 07/12/2011

## 2011-07-12 NOTE — Progress Notes (Signed)
Reviewed nuclear images with Dr. Charlton Haws from 2013 and 2010.   Essentially unchanged. Low risk scan.   Normal EF. Ok to proceed with surgery at acceptable CV risk. Continue beta blocker throughout perioperative period. Our service available as necessary. Please fax copy to surgeon. Tereso Newcomer, PA-C  8:19 AM 07/12/2011

## 2011-07-27 ENCOUNTER — Encounter (HOSPITAL_COMMUNITY): Payer: Self-pay | Admitting: Pharmacy Technician

## 2011-07-28 ENCOUNTER — Encounter (HOSPITAL_COMMUNITY)
Admission: RE | Admit: 2011-07-28 | Discharge: 2011-07-28 | Disposition: A | Discharge status: Home / Self Care | Payer: Medicare Other | Source: Ambulatory Visit | Attending: Orthopedic Surgery | Admitting: Orthopedic Surgery

## 2011-07-28 ENCOUNTER — Ambulatory Visit (HOSPITAL_COMMUNITY)
Admission: RE | Admit: 2011-07-28 | Discharge: 2011-07-28 | Disposition: A | Discharge status: Home / Self Care | Payer: Medicare Other | Source: Ambulatory Visit | Attending: Orthopedic Surgery | Admitting: Orthopedic Surgery

## 2011-07-28 ENCOUNTER — Encounter (HOSPITAL_COMMUNITY): Payer: Self-pay

## 2011-07-28 DIAGNOSIS — J984 Other disorders of lung: Secondary | ICD-10-CM | POA: Insufficient documentation

## 2011-07-28 DIAGNOSIS — Z01818 Encounter for other preprocedural examination: Secondary | ICD-10-CM | POA: Insufficient documentation

## 2011-07-28 DIAGNOSIS — Z01812 Encounter for preprocedural laboratory examination: Secondary | ICD-10-CM | POA: Insufficient documentation

## 2011-07-28 DIAGNOSIS — D649 Anemia, unspecified: Secondary | ICD-10-CM | POA: Insufficient documentation

## 2011-07-28 DIAGNOSIS — I2510 Atherosclerotic heart disease of native coronary artery without angina pectoris: Secondary | ICD-10-CM | POA: Insufficient documentation

## 2011-07-28 LAB — COMPREHENSIVE METABOLIC PANEL
ALT: 20 U/L (ref 0–53)
Alkaline Phosphatase: 33 U/L — ABNORMAL LOW (ref 39–117)
CO2: 27 mEq/L (ref 19–32)
GFR calc Af Amer: 83 mL/min — ABNORMAL LOW (ref >90)
GFR calc non Af Amer: 72 mL/min — ABNORMAL LOW (ref >90)
Glucose, Bld: 91 mg/dL (ref 70–99)
Potassium: 4.4 mEq/L (ref 3.5–5.1)
Sodium: 135 mEq/L (ref 135–145)

## 2011-07-28 LAB — URINALYSIS, ROUTINE W REFLEX MICROSCOPIC
Glucose, UA: NEGATIVE mg/dL
Hgb urine dipstick: NEGATIVE
Ketones, ur: NEGATIVE mg/dL
Protein, ur: NEGATIVE mg/dL

## 2011-07-28 LAB — APTT: aPTT: 30 seconds (ref 24–37)

## 2011-07-28 LAB — CBC
Hemoglobin: 12.1 g/dL — ABNORMAL LOW (ref 13.0–17.0)
MCH: 32.4 pg (ref 26.0–34.0)
RBC: 3.73 MIL/uL — ABNORMAL LOW (ref 4.22–5.81)

## 2011-07-28 NOTE — Pre-Procedure Instructions (Signed)
PT'S NUCLEAR STRESS TEST REPORT  07/05/11 WITH CARDIAC CLEARANCE ON THIS CHART --ALONG WITH EKG AND CARDIOLOGY OFFICE NOTE 06/27/11   - ALL FROM [facility]. CXR , CBC, CMET, PT,PTT, UA WERE DONE TODAY AT [facility]--T/S TO BE DRAWN ON DAY OF SURGERY.

## 2011-07-28 NOTE — Patient Instructions (Signed)
YOUR SURGERY IS SCHEDULED ON:  Monday  3/11  AT 11:15 AM  REPORT TO [facility] AT:   8:45 AM      PHONE # FOR SHORT STAY IS [phone]  DO NOT EAT OR DRINK ANYTHING AFTER MIDNIGHT THE NIGHT BEFORE YOUR SURGERY.  YOU MAY BRUSH YOUR TEETH, RINSE OUT YOUR MOUTH--BUT NO WATER, NO FOOD, NO CHEWING GUM, NO MINTS, NO CANDIES, NO CHEWING TOBACCO.  PLEASE TAKE THE FOLLOWING MEDICATIONS THE AM OF YOUR SURGERY WITH A FEW SIPS OF WATER:   METOPROLOL, PAXIL     BRING EYE DROPS    IF YOU USE INHALERS--USE YOUR INHALERS THE AM OF YOUR SURGERY AND BRING INHALERS TO THE HOSPITAL -TAKE TO SURGERY.    IF YOU ARE DIABETIC:  DO NOT TAKE ANY DIABETIC MEDICATIONS THE AM OF YOUR SURGERY.  IF YOU TAKE INSULIN IN THE EVENINGS--PLEASE ONLY TAKE 1/2 NORMAL EVENING DOSE THE NIGHT BEFORE YOUR SURGERY.  NO INSULIN THE AM OF YOUR SURGERY.  IF YOU HAVE SLEEP APNEA AND USE CPAP OR BIPAP--PLEASE BRING THE MASK --NOT THE MACHINE-NOT THE TUBING   -JUST THE MASK. DO NOT BRING VALUABLES, MONEY, CREDIT CARDS.  CONTACT LENS, DENTURES / PARTIALS, GLASSES SHOULD NOT BE WORN TO SURGERY AND IN MOST CASES-HEARING AIDS WILL NEED TO BE REMOVED.  BRING YOUR GLASSES CASE, ANY EQUIPMENT NEEDED FOR YOUR CONTACT LENS. FOR PATIENTS ADMITTED TO THE HOSPITAL--CHECK OUT TIME THE DAY OF DISCHARGE IS 11:00 AM.  ALL INPATIENT ROOMS ARE PRIVATE - WITH BATHROOM, TELEPHONE, TELEVISION AND WIFI INTERNET. IF YOU ARE BEING DISCHARGED THE SAME DAY OF YOUR SURGERY--YOU CAN NOT DRIVE YOURSELF HOME--AND SHOULD NOT GO HOME ALONE BY TAXI OR BUS.  NO DRIVING OR OPERATING MACHINERY FOR 24 HOURS FOLLOWING ANESTHESIA / PAIN MEDICATIONS.                            SPECIAL INSTRUCTIONS:  CHLORHEXIDINE SOAP SHOWER (other brand names are Betasept and Hibiclens ) PLEASE SHOWER WITH CHLORHEXIDINE THE NIGHT BEFORE YOUR SURGERY AND THE AM OF YOUR SURGERY. DO NOT USE CHLORHEXIDINE ON YOUR FACE OR PRIVATE AREAS--YOU MAY USE YOUR NORMAL SOAP THOSE AREAS AND  YOUR NORMAL SHAMPOO.  WOMEN SHOULD AVOID SHAVING UNDER ARMS AND SHAVING LEGS 48 HOURS BEFORE USING CHLORHEXIDINE TO AVOID SKIN IRRITATION.  DO NOT USE IF ALLERGIC TO CHLORHEXIDINE.  PLEASE READ OVER ANY  FACT SHEETS THAT YOU WERE GIVEN: MRSA INFORMATION, BLOOD TRANSFUSION INFORMATION, INCENTIVE SPIROMETER INFORMATION.

## 2011-08-06 ENCOUNTER — Other Ambulatory Visit: Payer: Self-pay | Admitting: Orthopedic Surgery

## 2011-08-06 NOTE — H&P (Signed)
Ethan Robinson  DOB: 02-08-1935 Married / Language: English / Race: White / Male  Date of Admission:  08/07/2011  Chief Complaint:  Right Knee Pain  History of Present Illness The patient is a 77 year old male who comes in for a preoperative History and Physical. The patient is scheduled for a right total knee arthroplasty to be performed by Dr. Gus Rankin. Aluisio, MD at [facility] on 08/07/2011. The patient is a 77 year old male who presents today for follow up of their knee. The patient is being followed for their right knee pain and osteoarthritis. The patient has not gotten any relief of their symptoms with viscosupplementation. Note for "Follow-up Knee": He said his knee is very painful when going up steps, and the Synvisc did not make a difference that he can see. Mr. Palmateer states the knee is getting progressively worse over time. Unfortunately, Synvisc did not help. He is wondering what other options he may have at this time. The right knee is the only one bothering him. They have been treated conservatively in the past for the above stated problem and despite conservative measures, they continue to have progressive pain and severe functional limitations and dysfunction. They have failed non-operative management including home exercise, medications, and injections. It is felt that they would benefit from undergoing total joint replacement. Risks and benefits of the procedure have been discussed with the patient and they elect to proceed with surgery. There are no active contraindications to surgery such as ongoing infection or rapidly progressive neurological disease.  Allergies No Known Drug Allergies  Medication History Metoprolol Succinate (25MG  Tablet ER 24HR, Oral daily) Active. PARoxetine HCl (20MG  Tablet, Oral daily) Active. Crestor (20MG  Tablet, Oral at bedtime) Active. Tamsulosin HCl (0.4MG  Capsule, Oral at bedtime) Active. Aspirin EC (325MG  Tablet DR, Oral at  bedtime) Active. SulfaSALAzine (500MG  Tablet, 2 tabs Oral two times daily) Active. Hydroxychloroquine Sulfate (200MG  Tablet, 1 tab Oral two times daily) Active. Folic Acid (1MG  Tablet, Oral daily) Active. Methotrexate Sodium (1GM/40ML Solution, Injection once a week) Active.  Problem List/Past Medical Coronary Artery Disease/Heart Disease Hypertension Hyperlipidemia Sleep Apnea Rheumatoid Arthritis Anemia Gastroesophageal Reflux Disease  Past Surgical History Arthroscopy of Shoulder. left Heart Stents Spinal Surgery Carpal Tunnel Surgery - Both Arthroscopic Knee Surgery - Right. times 2 Cardiac Catheterization. three stents Multiple Trigger Finger Surgeries Cataract Removal, Insert Prosthetic Lens. Bilateral  Family History Osteoporosis. mother Father. Deceased. age 33, esophageal cancer Mother. Deceased, Alzheimer's disease. age 46  Social History Drug/Alcohol Rehab (Currently). no Children. 1 Marital status. married Tobacco use. former smoker Tobacco / smoke exposure. yes Number of flights of stairs before winded. 2-3 Post-Surgical Plans. Plan is for home.  Review of Systems General:Not Present- Chills, Fever, Night Sweats, Appetite Loss, Fatigue, Feeling sick, Weight Gain and Weight Loss. Skin:Present- Itching. Not Present- Rash, Skin Color Changes, Ulcer, Psoriasis and Change in Hair or Nails. HEENT:Present- Hearing problems and Ringing in the Ears. Not Present- Sensitivity to light and Nose Bleed. Respiratory:Present- Chronic Cough. Not Present- Snoring, Bloody sputum and Dyspnea. Cardiovascular:Present- Palpitations. Not Present- Shortness of Breath, Chest Pain, Swelling of Extremities and Leg Cramps. Neurological:Present- Tingling, Numbness, Tremor and Dizziness. Not Present- Burning and Headaches. Hematology:Present- Anemia. Not Present- Abnormal Bleeding, Blood Clots and Easy Bruising.  Vitals Weight: 194 lb Height: 72 in Body  Surface Area: 2.11 m Body Mass Index: 26.31 kg/m Pulse: 60 (Regular) Resp.: 12 (Unlabored) BP: 146/78 (Sitting, Right Arm, Standard)  Physical Exam The physical exam findings are  as follows: Patient is a 77 year old male with continued knee pain.  General Mental Status - Alert, cooperative and good historian. General Appearance- pleasant. Not in acute distress. Orientation- Oriented X3. Build & Nutrition- Well nourished and Well developed.  Head and Neck Head- normocephalic, atraumatic . Neck Global Assessment- supple. no bruit auscultated on the right and no bruit auscultated on the left.  Eye Pupil- Bilateral- Regular and Round. Motion- Bilateral- EOMI.  Chest and Lung Exam Auscultation: Breath sounds:- clear at anterior chest wall and - clear at posterior chest wall. Adventitious sounds:- No Adventitious sounds.  Cardiovascular Auscultation:Rhythm- Regular rate and rhythm. Heart Sounds- S1 WNL and S2 WNL. Murmurs & Other Heart Sounds: Murmur 1:Location- Aortic Area. Timing- Early systolic. Grade- II/VI. Character- Low pitched.  Abdomen Palpation/Percussion:Tenderness- Abdomen is non-tender to palpation. Rigidity (guarding)- Abdomen is soft. Auscultation:Auscultation of the abdomen reveals - Bowel sounds normal.  Male Genitourinary Not Robinson, not pertinent to present illness  Musculoskeletal The right hip shows normal motion and no discomfort. The right knee shows no effusion. Range is about 5-115. He has a sight varus deformity. There is no instability.  RADIOGRAPHS: Right knee shows he has bone on bone significant medial and patellofemoral. He has a varus deformity.  Assessment & Plan Osteoarthritis Right Knee  Patient is for a Right Total Knee Replacement by Dr. Lequita Halt.  Plan is to go home with wife.  Cards - Dr. Gala Romney - Patient has been seen preoperatively by Dr. Gala Romney and has undergone a recent stress test.  It was a low risk scan and it is felt that it is okay to proceed with surgery at acceptable CV risk. It is recommeded to continue the beta blocker throughout the perioperavtive period.  Avel Peace, PA-C

## 2011-08-07 ENCOUNTER — Encounter (HOSPITAL_COMMUNITY): Payer: Self-pay | Admitting: *Deleted

## 2011-08-07 ENCOUNTER — Inpatient Hospital Stay (HOSPITAL_COMMUNITY): Payer: Medicare Other | Admitting: Anesthesiology

## 2011-08-07 ENCOUNTER — Inpatient Hospital Stay (HOSPITAL_COMMUNITY)
Admission: RE | Admit: 2011-08-07 | Discharge: 2011-08-10 | DRG: 470 | Disposition: A | Discharge status: Home Health | Payer: Medicare Other | Source: Ambulatory Visit | Attending: Orthopedic Surgery | Admitting: Orthopedic Surgery

## 2011-08-07 ENCOUNTER — Encounter (HOSPITAL_COMMUNITY): Payer: Self-pay | Admitting: Anesthesiology

## 2011-08-07 ENCOUNTER — Encounter (HOSPITAL_COMMUNITY)
Admission: RE | Disposition: A | Discharge status: Home Health | Payer: Self-pay | Source: Ambulatory Visit | Attending: Orthopedic Surgery

## 2011-08-07 DIAGNOSIS — M069 Rheumatoid arthritis, unspecified: Secondary | ICD-10-CM | POA: Diagnosis present

## 2011-08-07 DIAGNOSIS — Z96659 Presence of unspecified artificial knee joint: Secondary | ICD-10-CM

## 2011-08-07 DIAGNOSIS — I2510 Atherosclerotic heart disease of native coronary artery without angina pectoris: Secondary | ICD-10-CM | POA: Diagnosis present

## 2011-08-07 DIAGNOSIS — M1710 Unilateral primary osteoarthritis, unspecified knee: Principal | ICD-10-CM | POA: Diagnosis present

## 2011-08-07 DIAGNOSIS — G4730 Sleep apnea, unspecified: Secondary | ICD-10-CM | POA: Diagnosis present

## 2011-08-07 DIAGNOSIS — M179 Osteoarthritis of knee, unspecified: Secondary | ICD-10-CM | POA: Diagnosis present

## 2011-08-07 DIAGNOSIS — Z9289 Personal history of other medical treatment: Secondary | ICD-10-CM

## 2011-08-07 DIAGNOSIS — D62 Acute posthemorrhagic anemia: Secondary | ICD-10-CM | POA: Diagnosis not present

## 2011-08-07 DIAGNOSIS — E871 Hypo-osmolality and hyponatremia: Secondary | ICD-10-CM | POA: Diagnosis not present

## 2011-08-07 DIAGNOSIS — I10 Essential (primary) hypertension: Secondary | ICD-10-CM | POA: Diagnosis present

## 2011-08-07 DIAGNOSIS — K219 Gastro-esophageal reflux disease without esophagitis: Secondary | ICD-10-CM | POA: Diagnosis present

## 2011-08-07 DIAGNOSIS — E785 Hyperlipidemia, unspecified: Secondary | ICD-10-CM | POA: Diagnosis present

## 2011-08-07 HISTORY — PX: TOTAL KNEE ARTHROPLASTY: SHX125

## 2011-08-07 SURGERY — ARTHROPLASTY, KNEE, TOTAL
Anesthesia: Spinal | Site: Knee | Laterality: Right | Wound class: Clean

## 2011-08-07 MED ORDER — ACETAMINOPHEN 10 MG/ML IV SOLN
1000.0000 mg | Freq: Once | INTRAVENOUS | Status: AC
  Administered 2011-08-07: 1000 mg via INTRAVENOUS

## 2011-08-07 MED ORDER — DIPHENHYDRAMINE HCL 50 MG/ML IJ SOLN: 12.5000 mg | Freq: Four times a day (QID) | INTRAMUSCULAR | Status: DC | PRN

## 2011-08-07 MED ORDER — OXYCODONE HCL 5 MG PO TABS
5.0000 mg | ORAL_TABLET | ORAL | Status: DC | PRN
  Administered 2011-08-08: 10 mg via ORAL
  Administered 2011-08-09 – 2011-08-10 (×2): 5 mg via ORAL
  Filled 2011-08-07: qty 1
  Filled 2011-08-07: qty 2
  Filled 2011-08-07: qty 1

## 2011-08-07 MED ORDER — SODIUM CHLORIDE 0.9 % IV SOLN: INTRAVENOUS | Status: DC

## 2011-08-07 MED ORDER — METHOCARBAMOL 100 MG/ML IJ SOLN
500.0000 mg | Freq: Four times a day (QID) | INTRAVENOUS | Status: DC | PRN
  Filled 2011-08-07: qty 5

## 2011-08-07 MED ORDER — POLYETHYL GLYCOL-PROPYL GLYCOL 0.4-0.3 % OP SOLN: 1.0000 [drp] | Freq: Two times a day (BID) | OPHTHALMIC | Status: DC

## 2011-08-07 MED ORDER — ACETAMINOPHEN 325 MG PO TABS: 650.0000 mg | ORAL_TABLET | Freq: Four times a day (QID) | ORAL | Status: DC | PRN

## 2011-08-07 MED ORDER — FENTANYL CITRATE 0.05 MG/ML IJ SOLN
INTRAMUSCULAR | Status: DC | PRN
  Administered 2011-08-07: 25 ug via INTRAVENOUS
  Administered 2011-08-07: 50 ug via INTRAVENOUS
  Administered 2011-08-07: 25 ug via INTRAVENOUS

## 2011-08-07 MED ORDER — PROMETHAZINE HCL 25 MG/ML IJ SOLN: 6.2500 mg | INTRAMUSCULAR | Status: DC | PRN

## 2011-08-07 MED ORDER — BUPIVACAINE HCL 0.75 % IJ SOLN
INTRAMUSCULAR | Status: DC | PRN
  Administered 2011-08-07: 15 mg

## 2011-08-07 MED ORDER — METOPROLOL SUCCINATE ER 25 MG PO TB24
25.0000 mg | ORAL_TABLET | Freq: Every day | ORAL | Status: DC
  Administered 2011-08-08 – 2011-08-10 (×3): 25 mg via ORAL
  Filled 2011-08-07 (×3): qty 1

## 2011-08-07 MED ORDER — ONDANSETRON HCL 4 MG/2ML IJ SOLN
4.0000 mg | Freq: Four times a day (QID) | INTRAMUSCULAR | Status: DC | PRN
  Filled 2011-08-07: qty 2

## 2011-08-07 MED ORDER — TAMSULOSIN HCL 0.4 MG PO CAPS
0.4000 mg | ORAL_CAPSULE | Freq: Every day | ORAL | Status: DC
  Administered 2011-08-07 – 2011-08-10 (×4): 0.4 mg via ORAL
  Filled 2011-08-07 (×4): qty 1

## 2011-08-07 MED ORDER — BUPIVACAINE 0.25 % ON-Q PUMP SINGLE CATH 300ML
INJECTION | Status: AC
  Filled 2011-08-07: qty 300

## 2011-08-07 MED ORDER — DEXAMETHASONE SODIUM PHOSPHATE 10 MG/ML IJ SOLN: 10.0000 mg | Freq: Once | INTRAMUSCULAR | Status: DC

## 2011-08-07 MED ORDER — MENTHOL 3 MG MT LOZG: 1.0000 | LOZENGE | OROMUCOSAL | Status: DC | PRN

## 2011-08-07 MED ORDER — MORPHINE SULFATE (PF) 1 MG/ML IV SOLN
INTRAVENOUS | Status: DC
  Administered 2011-08-07: 14:00:00 via INTRAVENOUS

## 2011-08-07 MED ORDER — TEMAZEPAM 15 MG PO CAPS
15.0000 mg | ORAL_CAPSULE | Freq: Every evening | ORAL | Status: DC | PRN
  Administered 2011-08-08: 30 mg via ORAL
  Filled 2011-08-07: qty 2

## 2011-08-07 MED ORDER — DIPHENHYDRAMINE HCL 12.5 MG/5ML PO ELIX: 12.5000 mg | ORAL_SOLUTION | ORAL | Status: DC | PRN

## 2011-08-07 MED ORDER — EPHEDRINE SULFATE 50 MG/ML IJ SOLN
INTRAMUSCULAR | Status: DC | PRN
  Administered 2011-08-07 (×2): 5 mg via INTRAVENOUS

## 2011-08-07 MED ORDER — CEFAZOLIN SODIUM 1-5 GM-% IV SOLN
1.0000 g | Freq: Four times a day (QID) | INTRAVENOUS | Status: AC
  Administered 2011-08-07 – 2011-08-08 (×3): 1 g via INTRAVENOUS
  Filled 2011-08-07 (×3): qty 50

## 2011-08-07 MED ORDER — ONDANSETRON HCL 4 MG PO TABS: 4.0000 mg | ORAL_TABLET | Freq: Four times a day (QID) | ORAL | Status: DC | PRN

## 2011-08-07 MED ORDER — PROPOFOL 10 MG/ML IV EMUL
INTRAVENOUS | Status: DC | PRN
  Administered 2011-08-07: 50 ug/kg/min via INTRAVENOUS

## 2011-08-07 MED ORDER — ACETAMINOPHEN 10 MG/ML IV SOLN
1000.0000 mg | Freq: Four times a day (QID) | INTRAVENOUS | Status: AC
  Administered 2011-08-07 – 2011-08-08 (×4): 1000 mg via INTRAVENOUS
  Filled 2011-08-07 (×4): qty 100

## 2011-08-07 MED ORDER — DOCUSATE SODIUM 100 MG PO CAPS
100.0000 mg | ORAL_CAPSULE | Freq: Two times a day (BID) | ORAL | Status: DC
  Administered 2011-08-07 – 2011-08-10 (×6): 100 mg via ORAL
  Filled 2011-08-07 (×6): qty 1

## 2011-08-07 MED ORDER — PAROXETINE HCL 20 MG PO TABS
20.0000 mg | ORAL_TABLET | Freq: Every day | ORAL | Status: DC
  Administered 2011-08-08 – 2011-08-10 (×3): 20 mg via ORAL
  Filled 2011-08-07 (×3): qty 1

## 2011-08-07 MED ORDER — POLYETHYLENE GLYCOL 3350 17 G PO PACK: 17.0000 g | PACK | Freq: Every day | ORAL | Status: DC | PRN

## 2011-08-07 MED ORDER — FLEET ENEMA 7-19 GM/118ML RE ENEM: 1.0000 | ENEMA | Freq: Once | RECTAL | Status: AC | PRN

## 2011-08-07 MED ORDER — MORPHINE SULFATE (PF) 1 MG/ML IV SOLN
INTRAVENOUS | Status: AC
  Filled 2011-08-07: qty 25

## 2011-08-07 MED ORDER — DIPHENHYDRAMINE HCL 12.5 MG/5ML PO ELIX
12.5000 mg | ORAL_SOLUTION | Freq: Four times a day (QID) | ORAL | Status: DC | PRN
  Filled 2011-08-07: qty 5

## 2011-08-07 MED ORDER — POLYVINYL ALCOHOL 1.4 % OP SOLN
1.0000 [drp] | Freq: Two times a day (BID) | OPHTHALMIC | Status: DC
  Administered 2011-08-07 – 2011-08-10 (×5): 1 [drp] via OPHTHALMIC
  Filled 2011-08-07: qty 15

## 2011-08-07 MED ORDER — BUPIVACAINE ON-Q PAIN PUMP (FOR ORDER SET NO CHG)
INJECTION | Status: DC
  Filled 2011-08-07: qty 1

## 2011-08-07 MED ORDER — LACTATED RINGERS IV SOLN
INTRAVENOUS | Status: DC
  Administered 2011-08-07: 1000 mL via INTRAVENOUS
  Administered 2011-08-07 (×2): via INTRAVENOUS

## 2011-08-07 MED ORDER — ONDANSETRON HCL 4 MG/2ML IJ SOLN: 4.0000 mg | Freq: Four times a day (QID) | INTRAMUSCULAR | Status: DC | PRN

## 2011-08-07 MED ORDER — METOCLOPRAMIDE HCL 10 MG PO TABS: 5.0000 mg | ORAL_TABLET | Freq: Three times a day (TID) | ORAL | Status: DC | PRN

## 2011-08-07 MED ORDER — SODIUM CHLORIDE 0.9 % IJ SOLN: 9.0000 mL | INTRAMUSCULAR | Status: DC | PRN

## 2011-08-07 MED ORDER — DEXTROSE-NACL 5-0.9 % IV SOLN
INTRAVENOUS | Status: DC
  Administered 2011-08-07 – 2011-08-08 (×2): via INTRAVENOUS
  Administered 2011-08-09: 10 mL/h via INTRAVENOUS

## 2011-08-07 MED ORDER — METHOCARBAMOL 500 MG PO TABS
500.0000 mg | ORAL_TABLET | Freq: Four times a day (QID) | ORAL | Status: DC | PRN
  Administered 2011-08-08 – 2011-08-10 (×3): 500 mg via ORAL
  Filled 2011-08-07 (×3): qty 1

## 2011-08-07 MED ORDER — DEXAMETHASONE SODIUM PHOSPHATE 10 MG/ML IJ SOLN
INTRAMUSCULAR | Status: DC | PRN
  Administered 2011-08-07: 10 mg via INTRAVENOUS

## 2011-08-07 MED ORDER — METOCLOPRAMIDE HCL 5 MG/ML IJ SOLN: 5.0000 mg | Freq: Three times a day (TID) | INTRAMUSCULAR | Status: DC | PRN

## 2011-08-07 MED ORDER — SULFASALAZINE 500 MG PO TABS
1000.0000 mg | ORAL_TABLET | Freq: Two times a day (BID) | ORAL | Status: DC
  Administered 2011-08-07 – 2011-08-10 (×6): 1000 mg via ORAL
  Filled 2011-08-07 (×7): qty 2

## 2011-08-07 MED ORDER — ACETAMINOPHEN 650 MG RE SUPP: 650.0000 mg | Freq: Four times a day (QID) | RECTAL | Status: DC | PRN

## 2011-08-07 MED ORDER — HYDROMORPHONE HCL PF 1 MG/ML IJ SOLN: 0.2500 mg | INTRAMUSCULAR | Status: DC | PRN

## 2011-08-07 MED ORDER — CEFAZOLIN SODIUM-DEXTROSE 2-3 GM-% IV SOLR
2.0000 g | Freq: Once | INTRAVENOUS | Status: AC
  Administered 2011-08-07: 2 g via INTRAVENOUS

## 2011-08-07 MED ORDER — BUPIVACAINE 0.25 % ON-Q PUMP SINGLE CATH 300ML
300.0000 mL | INJECTION | Status: DC
  Filled 2011-08-07: qty 300

## 2011-08-07 MED ORDER — BISACODYL 10 MG RE SUPP: 10.0000 mg | Freq: Every day | RECTAL | Status: DC | PRN

## 2011-08-07 MED ORDER — PHENOL 1.4 % MT LIQD: 1.0000 | OROMUCOSAL | Status: DC | PRN

## 2011-08-07 MED ORDER — NALOXONE HCL 0.4 MG/ML IJ SOLN: 0.4000 mg | INTRAMUSCULAR | Status: DC | PRN

## 2011-08-07 MED ORDER — RIVAROXABAN 10 MG PO TABS
10.0000 mg | ORAL_TABLET | Freq: Every day | ORAL | Status: DC
  Administered 2011-08-08 – 2011-08-10 (×3): 10 mg via ORAL
  Filled 2011-08-07 (×3): qty 1

## 2011-08-07 SURGICAL SUPPLY — 55 items
BAG SPEC THK2 15X12 ZIP CLS (MISCELLANEOUS) ×1
BAG ZIPLOCK 12X15 (MISCELLANEOUS) ×2 IMPLANT
BANDAGE ELASTIC 6 VELCRO ST LF (GAUZE/BANDAGES/DRESSINGS) ×2 IMPLANT
BANDAGE ESMARK 6X9 LF (GAUZE/BANDAGES/DRESSINGS) ×1 IMPLANT
BLADE SAG 18X100X1.27 (BLADE) ×2 IMPLANT
BLADE SAW SGTL 11.0X1.19X90.0M (BLADE) ×2 IMPLANT
BNDG ESMARK 6X9 LF (GAUZE/BANDAGES/DRESSINGS) ×2
BOWL SMART MIX CTS (DISPOSABLE) ×2 IMPLANT
CATH KIT ON-Q SILVERSOAK 5IN (CATHETERS) ×2 IMPLANT
CEMENT HV SMART SET (Cement) ×2 IMPLANT
CLOSURE STERI STRIP 1/2 X4 (GAUZE/BANDAGES/DRESSINGS) ×2 IMPLANT
CLOTH BEACON ORANGE TIMEOUT ST (SAFETY) ×2 IMPLANT
CUFF TOURN SGL QUICK 34 (TOURNIQUET CUFF) ×2
CUFF TRNQT CYL 34X4X40X1 (TOURNIQUET CUFF) ×1 IMPLANT
DRAPE EXTREMITY T 121X128X90 (DRAPE) ×2 IMPLANT
DRAPE POUCH INSTRU U-SHP 10X18 (DRAPES) ×2 IMPLANT
DRAPE U-SHAPE 47X51 STRL (DRAPES) ×2 IMPLANT
DRSG ADAPTIC 3X8 NADH LF (GAUZE/BANDAGES/DRESSINGS) ×2 IMPLANT
DRSG EMULSION OIL 3X16 NADH (GAUZE/BANDAGES/DRESSINGS) ×2 IMPLANT
DRSG PAD ABDOMINAL 8X10 ST (GAUZE/BANDAGES/DRESSINGS) ×2 IMPLANT
DURAPREP 26ML APPLICATOR (WOUND CARE) ×2 IMPLANT
ELECT REM PT RETURN 9FT ADLT (ELECTROSURGICAL) ×2
ELECTRODE REM PT RTRN 9FT ADLT (ELECTROSURGICAL) ×1 IMPLANT
EVACUATOR 1/8 PVC DRAIN (DRAIN) ×2 IMPLANT
FACESHIELD LNG OPTICON STERILE (SAFETY) ×10 IMPLANT
GAUZE SPONGE 4X4 12PLY STRL LF (GAUZE/BANDAGES/DRESSINGS) ×2 IMPLANT
GLOVE BIO SURGEON STRL SZ7.5 (GLOVE) ×2 IMPLANT
GLOVE BIO SURGEON STRL SZ8 (GLOVE) ×2 IMPLANT
GLOVE BIOGEL PI IND STRL 8 (GLOVE) ×2 IMPLANT
GLOVE BIOGEL PI INDICATOR 8 (GLOVE) ×2
GOWN STRL NON-REIN LRG LVL3 (GOWN DISPOSABLE) ×2 IMPLANT
GOWN STRL REIN XL XLG (GOWN DISPOSABLE) ×2 IMPLANT
HANDPIECE INTERPULSE COAX TIP (DISPOSABLE) ×2
IMMOBILIZER KNEE 20 (SOFTGOODS) ×2
IMMOBILIZER KNEE 20 THIGH 36 (SOFTGOODS) ×1 IMPLANT
KIT BASIN OR (CUSTOM PROCEDURE TRAY) ×2 IMPLANT
MANIFOLD NEPTUNE II (INSTRUMENTS) ×2 IMPLANT
NS IRRIG 1000ML POUR BTL (IV SOLUTION) ×2 IMPLANT
PACK TOTAL JOINT (CUSTOM PROCEDURE TRAY) ×2 IMPLANT
PAD ABD 7.5X8 STRL (GAUZE/BANDAGES/DRESSINGS) ×2 IMPLANT
PADDING CAST COTTON 6X4 STRL (CAST SUPPLIES) ×6 IMPLANT
PADDING WEBRIL 6 STERILE (GAUZE/BANDAGES/DRESSINGS) ×2 IMPLANT
POSITIONER SURGICAL ARM (MISCELLANEOUS) ×2 IMPLANT
SET HNDPC FAN SPRY TIP SCT (DISPOSABLE) ×1 IMPLANT
SPONGE GAUZE 4X4 12PLY (GAUZE/BANDAGES/DRESSINGS) ×2 IMPLANT
STRIP CLOSURE SKIN 1/2X4 (GAUZE/BANDAGES/DRESSINGS) ×4 IMPLANT
SUCTION FRAZIER 12FR DISP (SUCTIONS) ×2 IMPLANT
SUT MNCRL AB 4-0 PS2 18 (SUTURE) ×2 IMPLANT
SUT PDS AB 1 CT1 27 (SUTURE) ×6 IMPLANT
SUT VIC AB 2-0 CT1 27 (SUTURE) ×6
SUT VIC AB 2-0 CT1 TAPERPNT 27 (SUTURE) ×3 IMPLANT
TOWEL OR 17X26 10 PK STRL BLUE (TOWEL DISPOSABLE) ×4 IMPLANT
TRAY FOLEY CATH 14FRSI W/METER (CATHETERS) ×2 IMPLANT
WATER STERILE IRR 1500ML POUR (IV SOLUTION) ×2 IMPLANT
WRAP KNEE MAXI GEL POST OP (GAUZE/BANDAGES/DRESSINGS) ×4 IMPLANT

## 2011-08-07 NOTE — Interval H&P Note (Signed)
History and Physical Interval Note:  08/07/2011 11:25 AM  Ethan Robinson  has presented today for surgery, with the diagnosis of osteoarthritis right knee  The various methods of treatment have been discussed with the patient and family. After consideration of risks, benefits and other options for treatment, the patient has consented to  Procedure(s) (LRB): TOTAL KNEE ARTHROPLASTY (Right) as a surgical intervention .  The patients' history has been reviewed, patient examined, no change in status, stable for surgery.  I have reviewed the patients' chart and labs.  Questions were answered to the patient's satisfaction.     Loanne Drilling

## 2011-08-07 NOTE — H&P (View-Only) (Signed)
Ethan Robinson  DOB: 05/15/1935 Married / Language: English / Race: White / Male  Date of Admission:  08/07/2011  Chief Complaint:  Right Knee Pain  History of Present Illness The patient is a 76 year old male who comes in for a preoperative History and Physical. The patient is scheduled for a right total knee arthroplasty to be performed by Dr. Frank V. Aluisio, MD at [facility] on 08/07/2011. The patient is a 76 year old male who presents today for follow up of their knee. The patient is being followed for their right knee pain and osteoarthritis. The patient has not gotten any relief of their symptoms with viscosupplementation. Note for "Follow-up Knee": He said his knee is very painful when going up steps, and the Synvisc did not make a difference that he can see. Ethan Robinson states the knee is getting progressively worse over time. Unfortunately, Synvisc did not help. He is wondering what other options he may have at this time. The right knee is the only one bothering him. They have been treated conservatively in the past for the above stated problem and despite conservative measures, they continue to have progressive pain and severe functional limitations and dysfunction. They have failed non-operative management including home exercise, medications, and injections. It is felt that they would benefit from undergoing total joint replacement. Risks and benefits of the procedure have been discussed with the patient and they elect to proceed with surgery. There are no active contraindications to surgery such as ongoing infection or rapidly progressive neurological disease.  Allergies No Known Drug Allergies  Medication History Metoprolol Succinate (25MG Tablet ER 24HR, Oral daily) Active. PARoxetine HCl (20MG Tablet, Oral daily) Active. Crestor (20MG Tablet, Oral at bedtime) Active. Tamsulosin HCl (0.4MG Capsule, Oral at bedtime) Active. Aspirin EC (325MG Tablet DR, Oral at  bedtime) Active. SulfaSALAzine (500MG Tablet, 2 tabs Oral two times daily) Active. Hydroxychloroquine Sulfate (200MG Tablet, 1 tab Oral two times daily) Active. Folic Acid (1MG Tablet, Oral daily) Active. Methotrexate Sodium (1GM/40ML Solution, Injection once a week) Active.  Problem List/Past Medical Coronary Artery Disease/Heart Disease Hypertension Hyperlipidemia Sleep Apnea Rheumatoid Arthritis Anemia Gastroesophageal Reflux Disease  Past Surgical History Arthroscopy of Shoulder. left Heart Stents Spinal Surgery Carpal Tunnel Surgery - Both Arthroscopic Knee Surgery - Right. times 2 Cardiac Catheterization. three stents Multiple Trigger Finger Surgeries Cataract Removal, Insert Prosthetic Lens. Bilateral  Family History Osteoporosis. mother Father. Deceased. age 64, esophageal cancer Mother. Deceased, Alzheimer's disease. age 87  Social History Drug/Alcohol Rehab (Currently). no Children. 1 Marital status. married Tobacco use. former smoker Tobacco / smoke exposure. yes Number of flights of stairs before winded. 2-3 Post-Surgical Plans. Plan is for home.  Review of Systems General:Not Present- Chills, Fever, Night Sweats, Appetite Loss, Fatigue, Feeling sick, Weight Gain and Weight Loss. Skin:Present- Itching. Not Present- Rash, Skin Color Changes, Ulcer, Psoriasis and Change in Hair or Nails. HEENT:Present- Hearing problems and Ringing in the Ears. Not Present- Sensitivity to light and Nose Bleed. Respiratory:Present- Chronic Cough. Not Present- Snoring, Bloody sputum and Dyspnea. Cardiovascular:Present- Palpitations. Not Present- Shortness of Breath, Chest Pain, Swelling of Extremities and Leg Cramps. Neurological:Present- Tingling, Numbness, Tremor and Dizziness. Not Present- Burning and Headaches. Hematology:Present- Anemia. Not Present- Abnormal Bleeding, Blood Clots and Easy Bruising.  Vitals Weight: 194 lb Height: 72 in Body  Surface Area: 2.11 m Body Mass Index: 26.31 kg/m Pulse: 60 (Regular) Resp.: 12 (Unlabored) BP: 146/78 (Sitting, Right Arm, Standard)  Physical Exam The physical exam findings are   as follows: Patient is a 76 year old male with continued knee pain.  General Mental Status - Alert, cooperative and good historian. General Appearance- pleasant. Not in acute distress. Orientation- Oriented X3. Build & Nutrition- Well nourished and Well developed.  Head and Neck Head- normocephalic, atraumatic . Neck Global Assessment- supple. no bruit auscultated on the right and no bruit auscultated on the left.  Eye Pupil- Bilateral- Regular and Round. Motion- Bilateral- EOMI.  Chest and Lung Exam Auscultation: Breath sounds:- clear at anterior chest wall and - clear at posterior chest wall. Adventitious sounds:- No Adventitious sounds.  Cardiovascular Auscultation:Rhythm- Regular rate and rhythm. Heart Sounds- S1 WNL and S2 WNL. Murmurs & Other Heart Sounds: Murmur 1:Location- Aortic Area. Timing- Early systolic. Grade- II/VI. Character- Low pitched.  Abdomen Palpation/Percussion:Tenderness- Abdomen is non-tender to palpation. Rigidity (guarding)- Abdomen is soft. Auscultation:Auscultation of the abdomen reveals - Bowel sounds normal.  Male Genitourinary Not done, not pertinent to present illness  Musculoskeletal The right hip shows normal motion and no discomfort. The right knee shows no effusion. Range is about 5-115. He has a sight varus deformity. There is no instability.  RADIOGRAPHS: Right knee shows he has bone on bone significant medial and patellofemoral. He has a varus deformity.  Assessment & Plan Osteoarthritis Right Knee  Patient is for a Right Total Knee Replacement by Dr. Aluisio.  Plan is to go home with wife.  Cards - Dr. Bensimhon - Patient has been seen preoperatively by Dr. Bensimhon and has undergone a recent stress test.  It was a low risk scan and it is felt that it is okay to proceed with surgery at acceptable CV risk. It is recommeded to continue the beta blocker throughout the perioperavtive period.  Ethan Ashauna Bertholf, PA-C  

## 2011-08-07 NOTE — Op Note (Signed)
Pre-operative diagnosis- Osteoarthritis  Right knee(s)  Post-operative diagnosis- Osteoarthritis Right knee(s)  Procedure-  Right  Total Knee Arthroplasty  Surgeon- Gus Rankin. Jhace Fennell, MD  Assistant- Avel Peace, PA-C   Anesthesia-  Spinal EBL-* No blood loss amount entered *  Drains Hemovac  Tourniquet time- 32 minutes @ 300 mm Hg Complications- None  Condition-PACU - hemodynamically stable.   Brief Clinical Note  Ethan Robinson is a 77 y.o. year old male with end stage OA of his right knee with progressively worsening pain and dysfunction. He has constant pain, with activity and at rest and significant functional deficits with difficulties even with ADLs. He has had extensive non-op management including analgesics, injections of cortisone and viscosupplements, and home exercise program, but remains in significant pain with significant dysfunction. Radiographs show bone on bone arthritis of the medial compartment with varus deformity. He presents now for right Total Knee Arthroplasty.    Procedure in detail---   The patient is brought into the operating room and positioned supine on the operating table. After successful administration of  Spinal,   a tourniquet is placed high on the  Right thigh(s) and the lower extremity is prepped and draped in the usual sterile fashion. Time out is performed by the operating team and then the  Right lower extremity is wrapped in Esmarch, knee flexed and the tourniquet inflated to 300 mmHg.       A midline incision is made with a ten blade through the subcutaneous tissue to the level of the extensor mechanism. A fresh blade is used to make a medial parapatellar arthrotomy. Soft tissue over the proximal medial tibia is subperiosteally elevated to the joint line with a knife and into the semimembranosus bursa with a Cobb elevator. Soft tissue over the proximal lateral tibia is elevated with attention being paid to avoiding the patellar tendon on the tibial  tubercle. The patella is everted, knee flexed 90 degrees and the ACL and PCL are removed. Findings are bone on bone medial and patellofemoral with medial osteophytes.        The drill is used to create a starting hole in the distal femur and the canal is thoroughly irrigated with sterile saline to remove the fatty contents. The 5 degree Right  valgus alignment guide is placed into the femoral canal and the distal femoral cutting block is pinned to remove 11 mm off the distal femur. Resection is made with an oscillating saw.      The tibia is subluxed forward and the menisci are removed. The extramedullary alignment guide is placed referencing proximally at the medial aspect of the tibial tubercle and distally along the second metatarsal axis and tibial crest. The block is pinned to remove 2mm off the more deficient medial  side. Resection is made with an oscillating saw. Size 4is the most appropriate size for the tibia and the proximal tibia is prepared with the modular drill and keel punch for that size.      The femoral sizing guide is placed and size 4 is most appropriate. Rotation is marked off the epicondylar axis and confirmed by creating a rectangular flexion gap at 90 degrees. The size 4 cutting block is pinned in this rotation and the anterior, posterior and chamfer cuts are made with the oscillating saw. The intercondylar block is then placed and that cut is made.      Trial size 4 tibial component, trial size 4 posterior stabilized femur and a 12.5  mm posterior stabilized  rotating platform insert trial is placed. Full extension is achieved with excellent varus/valgus and anterior/posterior balance throughout full range of motion. The patella is everted and thickness measured to be 26  mm. Free hand resection is taken to 14 mm, a 41 template is placed, lug holes are drilled, trial patella is placed, and it tracks normally. Osteophytes are removed off the posterior femur with the trial in place. All  trials are removed and the cut bone surfaces prepared with pulsatile lavage. Cement is mixed and once ready for implantation, the size 4 tibial implant, size  4 posterior stabilized femoral component, and the size 41 patella are cemented in place and the patella is held with the clamp. The trial insert is placed and the knee held in full extension. All extruded cement is removed and once the cement is hard the permanent 12.5 mm posterior stabilized rotating platform insert is placed into the tibial tray.      The wound is copiously irrigated with saline solution and the extensor mechanism closed over a hemovac drain with #1 PDS suture. The tourniquet is released for a total tourniquet time of 32 minutes. Flexion against gravity is 135 degrees and the patella tracks normally. Subcutaneous tissue is closed with 2.0 vicryl and subcuticular with running 4.0 Monocryl. The catheter for the Marcaine pain pump is placed and the pump is initiated. The incision is cleaned and dried and steri-strips and a bulky sterile dressing are applied. The limb is placed into a knee immobilizer and the patient is awakened and transported to recovery in stable condition.      Please note that a surgical assistant was a medical necessity for this procedure in order to perform it in a safe and expeditious manner. Surgical assistant was necessary to retract the ligaments and vital neurovascular structures to prevent injury to them and also necessary for proper positioning of the limb to allow for anatomic placement of the prosthesis.   Gus Rankin Pristine Gladhill, MD    08/07/2011, 12:38 PM

## 2011-08-07 NOTE — Anesthesia Preprocedure Evaluation (Signed)
Anesthesia Evaluation  Patient identified by MRN, date of birth, ID band Patient awake    Reviewed: Allergy & Precautions, H&P , NPO status , Patient's Chart, lab work & pertinent test results  Airway Mallampati: II TM Distance: >3 FB Neck ROM: Full    Dental No notable dental hx.    Pulmonary neg pulmonary ROS,  breath sounds clear to auscultation  Pulmonary exam normal       Cardiovascular hypertension, + CAD Rhythm:Regular Rate:Normal     Neuro/Psych negative neurological ROS  negative psych ROS   GI/Hepatic Neg liver ROS, GERD-  Medicated,  Endo/Other  negative endocrine ROS  Renal/GU negative Renal ROS  negative genitourinary   Musculoskeletal negative musculoskeletal ROS (+)   Abdominal   Peds negative pediatric ROS (+)  Hematology negative hematology ROS (+)   Anesthesia Other Findings   Reproductive/Obstetrics negative OB ROS                           Anesthesia Physical Anesthesia Plan  ASA: III  Anesthesia Plan: Spinal   Post-op Pain Management:    Induction: Intravenous  Airway Management Planned: Simple Face Mask  Additional Equipment:   Intra-op Plan:   Post-operative Plan:   Informed Consent: I have reviewed the patients History and Physical, chart, labs and discussed the procedure including the risks, benefits and alternatives for the proposed anesthesia with the patient or authorized representative who has indicated his/her understanding and acceptance.   Dental advisory given  Plan Discussed with: CRNA  Anesthesia Plan Comments:         Anesthesia Quick Evaluation

## 2011-08-07 NOTE — Anesthesia Postprocedure Evaluation (Signed)
  Anesthesia Post-op Note  Patient: Ethan Robinson  Procedure(s) Performed: Procedure(s) (LRB): TOTAL KNEE ARTHROPLASTY (Right)  Patient Location: PACU  Anesthesia Type: Spinal  Level of Consciousness: awake and alert   Airway and Oxygen Therapy: Patient Spontanous Breathing  Post-op Pain: mild  Post-op Assessment: Post-op Vital signs reviewed, Patient's Cardiovascular Status Stable, Respiratory Function Stable, Patent Airway and No signs of Nausea or vomiting  Post-op Vital Signs: stable  Complications: No apparent anesthesia complications

## 2011-08-07 NOTE — Anesthesia Procedure Notes (Signed)
Spinal  Patient location during procedure: OR Start time: 08/07/2011 11:35 AM End time: 08/07/2011 11:45 AM Staffing Anesthesiologist: ROSE, Greggory Stallion CRNA/Resident: Paris Lore Performed by: anesthesiologist and resident/CRNA  Preanesthetic Checklist Completed: patient identified, site marked, surgical consent, pre-op evaluation, timeout performed, IV checked, risks and benefits discussed and monitors and equipment checked Spinal Block Patient position: sitting Prep: Betadine Patient monitoring: heart rate, continuous pulse ox and blood pressure Location: L4-5 Injection technique: single-shot Needle Needle type: Spinocan  Needle gauge: 22 G Needle length: 9 cm Assessment Sensory level: T4 Additional Notes Expiration date of kit checked and confirmed. Patient tolerated procedure well, without complications. First attempt per CRNA L2-L3 without success, MD. Kris Mouton 1 L4-L5 neg. Blood return free flow/flush.

## 2011-08-07 NOTE — Transfer of Care (Signed)
Immediate Anesthesia Transfer of Care Note  Patient: Ethan Robinson  Procedure(s) Performed: Procedure(s) (LRB): TOTAL KNEE ARTHROPLASTY (Right)  Patient Location: PACU  Anesthesia Type: Spinal  Level of Consciousness: sedated, patient cooperative and responds to stimulaton  Airway & Oxygen Therapy: Patient Spontanous Breathing and Patient connected to face mask oxgen  Post-op Assessment: Report given to PACU RN and Post -op Vital signs reviewed and stable  Post vital signs: Reviewed and stable  Complications: No apparent anesthesia complications T-10 level on release to PACU staff

## 2011-08-08 DIAGNOSIS — E871 Hypo-osmolality and hyponatremia: Secondary | ICD-10-CM | POA: Diagnosis not present

## 2011-08-08 DIAGNOSIS — D62 Acute posthemorrhagic anemia: Secondary | ICD-10-CM | POA: Diagnosis not present

## 2011-08-08 LAB — CBC
HCT: 26.6 % — ABNORMAL LOW (ref 39.0–52.0)
Hemoglobin: 9.3 g/dL — ABNORMAL LOW (ref 13.0–17.0)
MCH: 32.7 pg (ref 26.0–34.0)
MCHC: 35 g/dL (ref 30.0–36.0)
MCV: 93.7 fL (ref 78.0–100.0)
Platelets: 226 10*3/uL (ref 150–400)
RBC: 2.84 MIL/uL — ABNORMAL LOW (ref 4.22–5.81)
RDW: 12.8 % (ref 11.5–15.5)
WBC: 12.8 10*3/uL — ABNORMAL HIGH (ref 4.0–10.5)

## 2011-08-08 LAB — BASIC METABOLIC PANEL
BUN: 14 mg/dL (ref 6–23)
Calcium: 8.4 mg/dL (ref 8.4–10.5)
Creatinine, Ser: 0.68 mg/dL (ref 0.50–1.35)
GFR calc non Af Amer: >90 mL/min (ref >90)
Glucose, Bld: 170 mg/dL — ABNORMAL HIGH (ref 70–99)

## 2011-08-08 MED ORDER — MORPHINE SULFATE 2 MG/ML IJ SOLN: 1.0000 mg | INTRAMUSCULAR | Status: DC | PRN

## 2011-08-08 MED ORDER — POLYSACCHARIDE IRON COMPLEX 150 MG PO CAPS
150.0000 mg | ORAL_CAPSULE | Freq: Every day | ORAL | Status: DC
  Administered 2011-08-08: 150 mg via ORAL
  Filled 2011-08-08: qty 1

## 2011-08-08 NOTE — Progress Notes (Signed)
Physical Therapy Treatment Patient Details Name: Ethan Robinson MRN: [id number] DOB: Oct 12, 1934 Today's Date: 08/08/2011  R TKR POD #1 pm session 14:00 - 14:25 1 gt  1 ta  PT Assessment/Plan  PT - Assessment/Plan Comments on Treatment Session: Amb pt in hallway 2nd time.  Pt progressing well.  A little impulsive, VC's to decrease gait speed to increase safety.  Assisted pt back to bed.  Pt plans to D/C to home. PT Plan: Discharge plan remains appropriate PT Frequency: 7X/week Recommendations for Other Services: OT consult Follow Up Recommendations: Home health PT Equipment Recommended: Rolling walker with 5" wheels PT Goals  Acute Rehab PT Goals PT Goal Formulation: With patient Time For Goal Achievement: 7 days Pt will go Supine/Side to Sit: with supervision PT Goal: Supine/Side to Sit - Progress: Progressing toward goal Pt will go Sit to Supine/Side: with supervision PT Goal: Sit to Supine/Side - Progress: Progressing toward goal Pt will go Sit to Stand: with supervision PT Goal: Sit to Stand - Progress: Progressing toward goal Pt will go Stand to Sit: with supervision PT Goal: Stand to Sit - Progress: Progressing toward goal Pt will Ambulate: >150 feet;with supervision;with rolling walker PT Goal: Ambulate - Progress: Progressing toward goal Pt will Go Up / Down Stairs: 3-5 stairs;with min assist;with least restrictive assistive device PT Goal: Up/Down Stairs - Progress: Goal set today Pt will Perform Home Exercise Program: with supervision, verbal cues required/provided PT Goal: Perform Home Exercise Program - Progress: Goal set today  PT Treatment Precautions/Restrictions  Precautions Precautions: Knee Required Braces or Orthoses: Yes Restrictions Weight Bearing Restrictions: Yes RLE Weight Bearing: Weight bearing as tolerated Mobility (including Balance) Bed Mobility Bed Mobility: Yes (Assisted pt back to bed) Supine to Sit: 4: Min assist Supine to Sit Details  (indicate cue type and reason): pt able to slide RLE to edge of bed,  Sit to Supine: 3: Mod assist Sit to Supine - Details (indicate cue type and reason): increased time Transfers Transfers: Yes Sit to Stand: 4: Min assist;From chair/3-in-1 Sit to Stand Details (indicate cue type and reason): 50% VC's on proper tech and hand placement as pt was a bit impulsive Stand to Sit: 4: Min assist;To bed Stand to Sit Details: 50% VC's on proper tech and hand placement as pt was a bit impulsive Ambulation/Gait Ambulation/Gait: Yes Ambulation/Gait Assistance: 4: Min assist Ambulation/Gait Assistance Details (indicate cue type and reason): with KI and increased time.  50% VC's to decrease gait speed and increase WB to R LE. Ambulation Distance (Feet): 25 Feet Assistive device: Rolling walker Gait Pattern: Step-to pattern;Decreased stance time - right;Decreased stride length;Trunk flexed Gait velocity: Pt c/o "right much" knee and upper thigh pain.  Pain meds requested. Stairs: No Wheelchair Mobility Wheelchair Mobility: No  Posture/Postural Control Posture/Postural Control: No significant limitations (pt does have resting tremors) End of Session PT - End of Session Equipment Utilized During Treatment: Gait belt;Right knee immobilizer Activity Tolerance: Patient tolerated treatment well Patient left: in bed;with call bell in reach Nurse Communication: Mobility status for transfers General Behavior During Session: [facility] for tasks performed Cognition: [facility] for tasks performed  Felecia Shelling  PTA [facility]  Acute  Rehab Pager     [phone]

## 2011-08-08 NOTE — Evaluation (Signed)
Physical Therapy Evaluation Patient Details Name: Ethan Robinson MRN: [id number] DOB: 31-Jul-1934 Today's Date: 08/08/2011  Problem List:  Patient Active Problem List  Diagnoses  . HYPERLIPIDEMIA  . ANEMIA-NOS  . DEPRESSION  . HYPERTENSION, UNSPECIFIED  . CORONARY ARTERY DISEASE  . ALLERGIC RHINITIS  . ASTHMA  . GERD  . PEPTIC ULCER DISEASE  . VENTRAL HERNIA  . RHEUMATOID ARTHRITIS  . DEGENERATIVE JOINT DISEASE  . ARTHRALGIA  . PAIN IN JOINT, MULTIPLE SITES  . TREMOR  . COUGH, CHRONIC  . DYSPHAGIA PHARYNGEAL PHASE  . HYPERGLYCEMIA, FASTING  . BENIGN PROSTATIC HYPERTROPHY, HX OF  . Unspecified adverse effect of unspecified drug, medicinal and biological substance  . Surgical Clearance Evaluation  . OA (osteoarthritis) of knee  . Postop Hyponatremia  . Postop Acute blood loss anemia    Past Medical History:  Past Medical History  Diagnosis Date  . Diverticulosis 2004    [facility]; due 2014  . Other and unspecified hyperlipidemia   . CAD (coronary artery disease)     Dr Gala Romney  . Unspecified essential hypertension   . Anemia 2010    H/H  12.9/38  . GERD (gastroesophageal reflux disease)     NO MEDS  . RA (rheumatoid arthritis)     Dr Dierdre Forth  AND OA BOTH KNEES AND HANDS   Past Surgical History:  Past Surgical History  Procedure Date  . Tonsillectomy   . Angioplasty 1997    3 stents  . Knee surgery     x 2  . Carpal tunnel release     bilateral  . Trigger finger release     multiple  . Back surgery 1995    for ruptured disc LS spine  . Shoulder surgery 2005  . Colonoscopy 2004    Diverticulosis  . Cataract extraction, bilateral 2011    PT Assessment/Plan/Recommendation PT Assessment PT Recommendation/Assessment: Patient will need skilled PT in the acute care venue PT Problem List: Decreased strength;Decreased range of motion;Decreased activity tolerance;Decreased knowledge of use of DME;Decreased safety awareness;Decreased knowledge of  precautions;Pain PT Therapy Diagnosis : Difficulty walking PT Plan PT Frequency: 7X/week PT Treatment/Interventions: DME instruction;Gait training;Stair training;Functional mobility training;Therapeutic activities;Therapeutic exercise;Patient/family education PT Recommendation Recommendations for Other Services: OT consult Follow Up Recommendations: Home health PT;Supervision/Assistance - 24 hour Equipment Recommended: Rolling walker with 5" wheels PT Goals  Acute Rehab PT Goals PT Goal Formulation: With patient Time For Goal Achievement: 7 days Pt will go Supine/Side to Sit: with supervision;with HOB 0 degrees PT Goal: Supine/Side to Sit - Progress: Goal set today Pt will go Sit to Supine/Side: with supervision;with HOB 0 degrees PT Goal: Sit to Supine/Side - Progress: Goal set today Pt will go Sit to Stand: with supervision PT Goal: Sit to Stand - Progress: Goal set today Pt will go Stand to Sit: with supervision PT Goal: Stand to Sit - Progress: Goal set today Pt will Ambulate: >150 feet;with supervision;with rolling walker PT Goal: Ambulate - Progress: Goal set today Pt will Go Up / Down Stairs: 3-5 stairs;with min assist;with least restrictive assistive device PT Goal: Up/Down Stairs - Progress: Goal set today Pt will Perform Home Exercise Program: with supervision, verbal cues required/provided PT Goal: Perform Home Exercise Program - Progress: Goal set today  PT Evaluation Precautions/Restrictions  Precautions Precautions: Knee Required Braces or Orthoses: Yes Restrictions Weight Bearing Restrictions: Yes RLE Weight Bearing: Weight bearing as tolerated Prior Functioning  Home Living Lives With: Spouse Type of Home: House Home Layout: Multi-level Alternate  Level Stairs-Rails: None Alternate Level Stairs-Number of Steps: 1 (between den) Home Access: Stairs to enter Entrance Stairs-Rails: None Entrance Stairs-Number of Steps: 4 Home Adaptive Equipment: Bedside  commode/3-in-1 Prior Function Level of Independence: Independent with basic ADLs Able to Take Stairs?: Yes Driving: Yes Cognition Cognition Arousal/Alertness: Awake/alert Overall Cognitive Status: Appears within functional limits for tasks assessed Orientation Level: Oriented X4 Sensation/Coordination Sensation Light Touch: Appears Intact Coordination Gross Motor Movements are Fluid and Coordinated: Yes Extremity Assessment RUE Assessment RUE Assessment: Within Functional Limits LUE Assessment LUE Assessment: Within Functional Limits RLE Assessment RLE Assessment: Exceptions to [facility] RLE AROM (degrees) RLE Overall AROM Comments: tolerated 40 degrees flex of knee RLE Strength RLE Overall Strength Comments: reqires assist for slr LLE Assessment LLE Assessment: Within Functional Limits Mobility (including Balance) Bed Mobility Bed Mobility: Yes Supine to Sit: 4: Min assist Supine to Sit Details (indicate cue type and reason): pt able to slide RLE to edge of bed,  Transfers Transfers: Yes Sit to Stand: 4: Min assist;From elevated surface;From bed;With upper extremity assist Sit to Stand Details (indicate cue type and reason): vc to push from bed. Stand to Sit: 4: Min assist;To chair/3-in-1;With armrests;With upper extremity assist Stand to Sit Details: vc to place RLE forward and reach to chair arms Ambulation/Gait Ambulation/Gait: Yes Ambulation/Gait Assistance: 4: Min assist Ambulation/Gait Assistance Details (indicate cue type and reason): vc for sequence, posture, wbat Ambulation Distance (Feet): 25 Feet Assistive device: Rolling walker Gait Pattern: Step-to pattern  Posture/Postural Control Posture/Postural Control: No significant limitations (pt does have resting tremors) Exercise  Total Joint Exercises Quad Sets: AROM;Both;10 reps;Supine Short Arc Quad: AAROM;Right;10 reps;Supine Heel Slides: AAROM;Right;10 reps;Supine Hip ABduction/ADduction: AAROM;Right;10  reps;Supine End of Session PT - End of Session Equipment Utilized During Treatment: Right knee immobilizer Activity Tolerance: Patient tolerated treatment well (mildly dizzy after walking.) Patient left: in chair;with call bell in reach Nurse Communication: Mobility status for transfers General Behavior During Session: [facility] for tasks performed Cognition: [facility] for tasks performed  Rada Hay 08/08/2011, 1:02 PM  [phone]

## 2011-08-08 NOTE — Progress Notes (Signed)
CARE MANAGEMENT NOTE 08/08/2011  Patient:  Ethan Robinson, Ethan Robinson   Account Number:  [id number]  Date Initiated:  08/08/2011  Documentation initiated by:  Colleen Can  Subjective/Objective Assessment:   DX OSTEOARTHRITIS RT KNEE: TOTAL KNEE REPLACEMNT     Action/Plan:   CM spoke with patient. Pt states he wishes to go back to his home in [place] where spouse will be caregiver. States he will need RW. States he wants [facility] agency that is in network . List of choice agencies placed in shadow chart   Anticipated DC Date:  08/10/2011   Anticipated DC Plan:  HOME W HOME HEALTH SERVICES  In-house referral  Clinical Social Worker      DC Associate Professor  CM consult      [facility] Choice  HOME HEALTH  DURABLE MEDICAL EQUIPMENT   Choice offered to / List presented to:  C-1 Patient           [facility] agency  [facility]   Status of service:  In process, will continue to follow   Comments:  08/08/2011 [facility] states they are in network and can provide HHpt. CM will follow for HH orders.

## 2011-08-08 NOTE — Progress Notes (Signed)
CSW consulted for SNF placement. Met with pt this am and reviewed PN. Pt plans to return home following hospitalization. PT has recommended HHPT. RNCM will assist with d/c planning .

## 2011-08-08 NOTE — Progress Notes (Signed)
Subjective: 1 Day Post-Op Procedure(s) (LRB): TOTAL KNEE ARTHROPLASTY (Right) Patient reports pain as mild.   Patient seen in rounds with Dr. Lequita Halt. Patient has complaints of not much sleep but otherwise doing well. We will start therapy today. Plan is to go SNF after hospital stay.  Objective: Vital signs in last 24 hours: Temp:  [97 F (36.1 C)-98.6 F (37 C)] 98.6 F (37 C) (03/12 0646) Pulse Rate:  [64-80] 70  (03/12 0857) Resp:  [10-18] 16  (03/12 0848) BP: (102-148)/(56-81) 108/60 mmHg (03/12 0857) SpO2:  [96 %-100 %] 96 % (03/12 0848) Weight:  [88.451 kg (195 lb)] 88.451 kg (195 lb) (03/11 1642)  Intake/Output from previous day:  Intake/Output Summary (Last 24 hours) at 08/08/11 0927 Last data filed at 08/08/11 0647  Gross per 24 hour  Intake 4945.42 ml  Output   1860 ml  Net 3085.42 ml    Intake/Output this shift: UOP 825  Labs:  Basename 08/08/11 0415  HGB 9.3*    Basename 08/08/11 0415  WBC 12.8*  RBC 2.84*  HCT 26.6*  PLT 226    Basename 08/08/11 0415  NA 134*  K 3.9  CL 103  CO2 23  BUN 14  CREATININE 0.68  GLUCOSE 170*  CALCIUM 8.4   No results found for this basename: LABPT:2,INR:2 in the last 72 hours  Exam - Neurovascular intact Sensation intact distally Dressing - clean, dry, no drainage Motor function intact - moving foot and toes well on exam.  Hemovac pulled without difficulty.  Past Medical History  Diagnosis Date  . Diverticulosis 2004    [facility]; due 2014  . Other and unspecified hyperlipidemia   . CAD (coronary artery disease)     Dr Gala Romney  . Unspecified essential hypertension   . Anemia 2010    H/H  12.9/38  . GERD (gastroesophageal reflux disease)     NO MEDS  . RA (rheumatoid arthritis)     Dr Dierdre Forth  AND OA BOTH KNEES AND HANDS    Assessment/Plan: 1 Day Post-Op Procedure(s) (LRB): TOTAL KNEE ARTHROPLASTY (Right) Principal Problem:  *OA (osteoarthritis) of knee   Advance diet Up with  therapy Continue foley due to strict I&O and urinary output monitoring Discharge to SNF  DVT Prophylaxis - Xarelto Protocol Weight-Bearing as tolerated to right leg Keep foley until tomorrow. No vaccines. D/C PCA Morphine, Change to IV push D/C O2 and Pulse OX and try on Room [address]  Patrica Duel 08/08/2011, 9:27 AM

## 2011-08-08 NOTE — Progress Notes (Signed)
Utilization review completed.  

## 2011-08-09 LAB — BASIC METABOLIC PANEL
BUN: 15 mg/dL (ref 6–23)
CO2: 23 mEq/L (ref 19–32)
Chloride: 103 mEq/L (ref 96–112)
GFR calc non Af Amer: 85 mL/min — ABNORMAL LOW (ref >90)
Glucose, Bld: 138 mg/dL — ABNORMAL HIGH (ref 70–99)
Potassium: 3.8 mEq/L (ref 3.5–5.1)
Sodium: 131 mEq/L — ABNORMAL LOW (ref 135–145)

## 2011-08-09 LAB — CBC
HCT: 24 % — ABNORMAL LOW (ref 39.0–52.0)
Hemoglobin: 8.3 g/dL — ABNORMAL LOW (ref 13.0–17.0)
RBC: 2.52 MIL/uL — ABNORMAL LOW (ref 4.22–5.81)

## 2011-08-09 MED ORDER — POLYSACCHARIDE IRON COMPLEX 150 MG PO CAPS
150.0000 mg | ORAL_CAPSULE | Freq: Every day | ORAL | Status: DC
  Administered 2011-08-09 – 2011-08-10 (×2): 150 mg via ORAL
  Filled 2011-08-09 (×2): qty 1

## 2011-08-09 MED ORDER — ACETAMINOPHEN 10 MG/ML IV SOLN
1000.0000 mg | Freq: Once | INTRAVENOUS | Status: AC
  Administered 2011-08-09: 1000 mg via INTRAVENOUS
  Filled 2011-08-09: qty 100

## 2011-08-09 NOTE — Progress Notes (Signed)
Physical Therapy Treatment Patient Details Name: Ethan Robinson MRN: [id number] DOB: 05-Aug-1934 Today's Date: 08/09/2011  PT Assessment/Plan  PT - Assessment/Plan Comments on Treatment Session: pt more alert, vc for safety, pt is unsteady at beginning of stam=nding. will need to work w/ pt's wife tomorrow to ensure safety. pt getting 2 units of blood. PT Plan: Discharge plan remains appropriate PT Frequency: 7X/week Recommendations for Other Services: OT consult Follow Up Recommendations: Home health PT Equipment Recommended: Rolling walker with 5" wheels PT Goals  Acute Rehab PT Goals PT Goal Formulation: With patient Pt will go Sit to Supine/Side: with supervision PT Goal: Sit to Supine/Side - Progress: Progressing toward goal Pt will go Sit to Stand: with supervision PT Goal: Sit to Stand - Progress: Progressing toward goal Pt will go Stand to Sit: with supervision PT Goal: Stand to Sit - Progress: Progressing toward goal Pt will Ambulate: >150 feet;with supervision;with rolling walker PT Goal: Ambulate - Progress: Progressing toward goal  PT Treatment Precautions/Restrictions  Precautions Precautions: Knee Required Braces or Orthoses: Yes Restrictions Weight Bearing Restrictions: Yes RLE Weight Bearing: Weight bearing as tolerated Mobility (including Balance) Bed Mobility Sit to Supine: 4: Min assist Sit to Supine - Details (indicate cue type and reason): lifting assist of lle Transfers Sit to Stand: 4: Min assist;With upper extremity assist;From bed;From chair/3-in-1 Sit to Stand Details (indicate cue type and reason): Assist to maintain balance forward over BOS, cues for hand placement and technique Stand to Sit: To bed;4: Min assist Stand to Sit Details: Cues to control descent, back up to bed. Ambulation/Gait Ambulation/Gait: Yes Ambulation/Gait Assistance: 1: +2 Total assist Ambulation/Gait Assistance Details (indicate cue type and reason): vc for sequence, pt  is shakey and mildly unbalanced when first gets up.+1 for safety. Ambulation Distance (Feet): 125 Feet Assistive device: Rolling walker Gait Pattern: Step-to pattern;Decreased stance time - right;Antalgic;Trunk flexed Gait velocity: Decreased    Exercise    End of Session PT - End of Session Activity Tolerance: Patient tolerated treatment well Patient left: in bed;with call bell in reach Nurse Communication: Mobility status for transfers General Behavior During Session: [facility] for tasks performed Cognition: [facility] for tasks performed  Rada Hay 08/09/2011, 4:35 PM

## 2011-08-09 NOTE — Progress Notes (Signed)
08/09/2011 Maebell Lyvers,RN BSN, CCM [phone] RW HAS BEEN DELIVERED TO PATIENT'S ROOM. ORDERS FOR HHPT FAXED TO [facility] -CONFIRMATION RECEIVED. PT IS CURRENTLY RECEIVING BLOOD TRANSFUSION-CM WILL FOLLOW FOR NEEDS

## 2011-08-09 NOTE — Progress Notes (Signed)
Subjective: 2 Days Post-Op Procedure(s) (LRB): TOTAL KNEE ARTHROPLASTY (Right) Patient reports pain as mild.   Patient seen in rounds with Dr. Lequita Halt. Patient has complaints of dizziness.  Tired with lower HGB, blood day.  Objective: Vital signs in last 24 hours: Temp:  [98.6 F (37 C)-100.6 F (38.1 C)] 100 F (37.8 C) (03/13 0641) Pulse Rate:  [55-83] 83  (03/13 0555) Resp:  [14-16] 16  (03/13 0555) BP: (96-127)/(56-66) 99/56 mmHg (03/13 0555) SpO2:  [93 %-99 %] 93 % (03/13 0555)  Intake/Output from previous day:  Intake/Output Summary (Last 24 hours) at 08/09/11 0811 Last data filed at 08/09/11 0540  Gross per 24 hour  Intake 590.42 ml  Output    900 ml  Net -309.58 ml    Intake/Output this shift:    Labs:  Basename 08/09/11 0412 08/08/11 0415  HGB 8.3* 9.3*    Basename 08/09/11 0412 08/08/11 0415  WBC 12.2* 12.8*  RBC 2.52* 2.84*  HCT 24.0* 26.6*  PLT 211 226    Basename 08/09/11 0412 08/08/11 0415  NA 131* 134*  K 3.8 3.9  CL 103 103  CO2 23 23  BUN 15 14  CREATININE 0.83 0.68  GLUCOSE 138* 170*  CALCIUM 8.4 8.4   No results found for this basename: LABPT:2,INR:2 in the last 72 hours  Exam - Neurovascular intact Sensation intact distally Dressing/Incision - clean, dry, no drainage Motor function intact - moving foot and toes well on exam.   Past Medical History  Diagnosis Date  . Diverticulosis 2004    [facility]; due 2014  . Other and unspecified hyperlipidemia   . CAD (coronary artery disease)     Dr Gala Romney  . Unspecified essential hypertension   . Anemia 2010    H/H  12.9/38  . GERD (gastroesophageal reflux disease)     NO MEDS  . RA (rheumatoid arthritis)     Dr Dierdre Forth  AND OA BOTH KNEES AND HANDS    Assessment/Plan: 2 Days Post-Op Procedure(s) (LRB): TOTAL KNEE ARTHROPLASTY (Right) Principal Problem:  *OA (osteoarthritis) of knee Active Problems:  Postop Hyponatremia  Postop Acute blood loss anemia   Advance diet Up  with therapy Continue foley due to blood transfusion; will continue until blood transfusion complete, strict I&O and urinary output monitoring Plan for discharge tomorrow  DVT Prophylaxis - Xarelto  Protocol Weight-Bearing as tolerated to right leg Blood today. Recheck labs.  Manasi Dishon 08/09/2011, 8:11 AM

## 2011-08-09 NOTE — Progress Notes (Signed)
Physical Therapy Treatment Patient Details Name: Ethan Robinson MRN: [id number] DOB: 18-Aug-1934 Today's Date: 08/09/2011  PT Assessment/Plan  PT - Assessment/Plan Comments on Treatment Session: Pt slightly impulsive this am.  RN in to prepare for blood transfusion at end of treatment.  Cues required for all aspects of mobility and overall requires min assist. PT Plan: Discharge plan remains appropriate PT Frequency: 7X/week Recommendations for Other Services: OT consult Follow Up Recommendations: Home health PT Equipment Recommended: Rolling walker with 5" wheels PT Goals  Acute Rehab PT Goals PT Goal: Supine/Side to Sit - Progress: Progressing toward goal PT Goal: Sit to Stand - Progress: Progressing toward goal PT Goal: Stand to Sit - Progress: Progressing toward goal PT Goal: Ambulate - Progress: Progressing toward goal PT Goal: Perform Home Exercise Program - Progress: Progressing toward goal  PT Treatment Precautions/Restrictions  Precautions Precautions: Knee Required Braces or Orthoses: Yes Restrictions Weight Bearing Restrictions: Yes RLE Weight Bearing: Weight bearing as tolerated Mobility (including Balance) Bed Mobility Bed Mobility: Yes Supine to Sit: 4: Min assist Supine to Sit Details (indicate cue type and reason): Assist at trunk to balance, cues for technique Transfers Transfers: Yes Sit to Stand: 4: Min assist;With upper extremity assist;From bed;From chair/3-in-1 Sit to Stand Details (indicate cue type and reason): Assist to maintain balance forward over BOS, cues for hand placement and technique Stand to Sit: 4: Min assist;To chair/3-in-1 Stand to Sit Details: Cues to control descent, back up to chair and control descent Ambulation/Gait Ambulation/Gait: Yes Ambulation/Gait Assistance: 4: Min assist Ambulation/Gait Assistance Details (indicate cue type and reason): verbal cues to sequence and for WB with RW Ambulation Distance (Feet): 25  Feet Assistive device: Rolling walker Gait Pattern: Step-to pattern;Decreased stance time - right;Antalgic;Trunk flexed Gait velocity: Decreased Stairs: No Wheelchair Mobility Wheelchair Mobility: No  Posture/Postural Control Posture/Postural Control: No significant limitations Balance Balance Assessed: No Exercise  Total Joint Exercises Quad Sets: AROM;Both;10 reps;Supine Towel Squeeze: AROM;Both;10 reps;Supine Short Arc Quad: AAROM;Right;10 reps;Supine Long Arc Quad: AAROM;Right;10 reps;Seated Knee Flexion: AAROM;Right;10 reps;Seated End of Session PT - End of Session Equipment Utilized During Treatment: Gait belt;Right knee immobilizer Activity Tolerance: Patient tolerated treatment well Patient left: in chair;with call bell in reach Nurse Communication: Mobility status for transfers General Behavior During Session: [facility] for tasks performed Cognition: [facility] for tasks performed  Newell Coral 08/09/2011, 10:33 AM  Newell Coral, PTA Acute Rehab

## 2011-08-09 NOTE — Progress Notes (Signed)
OT Note: Pt is receiving 2 units of blood today.  Will check back tomorrow.  [place], OTR/L [phone] 08/09/2011

## 2011-08-10 LAB — CBC
HCT: 28.2 % — ABNORMAL LOW (ref 39.0–52.0)
Hemoglobin: 9.9 g/dL — ABNORMAL LOW (ref 13.0–17.0)
MCV: 90.4 fL (ref 78.0–100.0)
Platelets: 195 10*3/uL (ref 150–400)
RBC: 3.12 MIL/uL — ABNORMAL LOW (ref 4.22–5.81)
WBC: 10.6 10*3/uL — ABNORMAL HIGH (ref 4.0–10.5)

## 2011-08-10 LAB — BASIC METABOLIC PANEL
CO2: 23 mEq/L (ref 19–32)
Chloride: 103 mEq/L (ref 96–112)
Creatinine, Ser: 0.79 mg/dL (ref 0.50–1.35)
Glucose, Bld: 113 mg/dL — ABNORMAL HIGH (ref 70–99)

## 2011-08-10 LAB — TYPE AND SCREEN
ABO/RH(D): O POS
Unit division: 0

## 2011-08-10 MED ORDER — METHOCARBAMOL 500 MG PO TABS: 500.0000 mg | ORAL_TABLET | Freq: Four times a day (QID) | ORAL | Status: AC | PRN

## 2011-08-10 MED ORDER — POLYSACCHARIDE IRON COMPLEX 150 MG PO CAPS: 150.0000 mg | ORAL_CAPSULE | Freq: Every day | ORAL | Status: DC

## 2011-08-10 MED ORDER — OXYCODONE HCL 5 MG PO TABS: 5.0000 mg | ORAL_TABLET | ORAL | Status: AC | PRN

## 2011-08-10 MED ORDER — RIVAROXABAN 10 MG PO TABS: 10.0000 mg | ORAL_TABLET | Freq: Every day | ORAL | Status: DC

## 2011-08-10 NOTE — Progress Notes (Signed)
Pt for d/c home with [facility] today. IV d/c'd. Pt still DTV after F/C d/c'd this am. Encouraged increase po intake. Still has poor appetite & needs to be encouraged to drink & eat. PT worked with pt & wife re: mobility, ambulation. Dressing CDI to R knee. Will provide d/c instructions & RX prior to d/c & after pt has voided.

## 2011-08-10 NOTE — Progress Notes (Signed)
Pt for d/c home today with [facility]. F/C d/c'd--DTV. IV d/c. D/C instructions & RX provided for pt. No changes in am assessments today. Await wife to pick up pt.

## 2011-08-10 NOTE — Progress Notes (Signed)
Subjective: 3 Days Post-Op Procedure(s) (LRB): TOTAL KNEE ARTHROPLASTY (Right) Patient reports pain as mild.   Patient seen in rounds with Dr. Lequita Halt. Patient feeling better after blood. Doing better and ready to go home.  Objective: Vital signs in last 24 hours: Temp:  [98.2 F (36.8 C)-99.5 F (37.5 C)] 99.5 F (37.5 C) (03/14 0620) Pulse Rate:  [68-87] 87  (03/14 0620) Resp:  [14-18] 17  (03/14 0620) BP: (101-144)/(51-80) 138/70 mmHg (03/14 0620) SpO2:  [93 %] 93 % (03/14 0620)  Intake/Output from previous day:  Intake/Output Summary (Last 24 hours) at 08/10/11 0933 Last data filed at 08/10/11 0748  Gross per 24 hour  Intake 1915.66 ml  Output   2500 ml  Net -584.34 ml    Intake/Output this shift: Total I/O In: -  Out: 250 [Urine:250]  Labs:  [facility] 08/10/11 0430 08/09/11 0412 08/08/11 0415  HGB 9.9* 8.3* 9.3*    Basename 08/10/11 0430 08/09/11 0412  WBC 10.6* 12.2*  RBC 3.12* 2.52*  HCT 28.2* 24.0*  PLT 195 211    Basename 08/10/11 0430 08/09/11 0412  NA 135 131*  K 3.6 3.8  CL 103 103  CO2 23 23  BUN 10 15  CREATININE 0.79 0.83  GLUCOSE 113* 138*  CALCIUM 8.5 8.4   No results found for this basename: LABPT:2,INR:2 in the last 72 hours  Exam: Neurovascular intact Sensation intact distally Incision - clean, dry, no drainage Motor function intact - moving foot and toes well on exam.   Assessment/Plan: 3 Days Post-Op Procedure(s) (LRB): TOTAL KNEE ARTHROPLASTY (Right) Procedure(s) (LRB): TOTAL KNEE ARTHROPLASTY (Right) Past Medical History  Diagnosis Date  . Diverticulosis 2004    [facility]; due 2014  . Other and unspecified hyperlipidemia   . CAD (coronary artery disease)     Dr Gala Romney  . Unspecified essential hypertension   . Anemia 2010    H/H  12.9/38  . GERD (gastroesophageal reflux disease)     NO MEDS  . RA (rheumatoid arthritis)     Dr Dierdre Forth  AND OA BOTH KNEES AND HANDS   Principal Problem:  *OA (osteoarthritis) of  knee Active Problems:  Postop Hyponatremia  Postop Acute blood loss anemia   Discharge home with home health Diet - heart healthy Follow up - in 2 weeks Activity - WBAT Condition Upon Discharge - Good D/C Meds - See DC Summary DVT Prophylaxis - Xarelto Protocol   Akela Pocius 08/10/2011, 9:33 AM

## 2011-08-10 NOTE — Discharge Instructions (Signed)
Knee Rehabilitation, Guidelines Following Surgery Results after knee surgery are often greatly improved when you follow the exercise, range of motion and muscle strengthening exercises prescribed by your doctor. Safety measures are also important to protect the knee from further injury. Any time any of these exercises cause you to have increased pain or swelling in your knee joint, decrease the amount until you are comfortable again and slowly increase them. If you have problems or questions, call your caregiver or physical therapist for advice. HOME CARE INSTRUCTIONS   Remove items at home which could result in a fall. This includes throw rugs or furniture in walking pathways.   Continue medications as instructed.   You may shower or take tub baths when your staples or stitches are removed or as instructed.   Walk using crutches or walker as instructed.   Put weight on your legs and walk as much as is comfortable.   You may resume a sexual relationship in one month or when given the OK by your doctor.   Return to work as instructed by your doctor.   Do not drive a car for 6 weeks or as instructed.   Wear elastic stockings until instructed not to.   Make sure you keep all of your appointments after your operation with all of your doctors and caregivers.  RANGE OF MOTION AND STRENGTHENING EXERCISES Rehabilitation of the knee is important following a knee injury or an operation. After just a few days of immobilization, the muscles of the thigh which control the knee become weakened and shrink (atrophy). Knee exercises are designed to build up the tone and strength of the thigh muscles and to improve knee motion. Often times heat used for twenty to thirty minutes before working out will loosen up your tissues and help with improving the range of motion. These exercises can be done on a training (exercise) mat, on the floor, on a table or on a bed. Use what ever works the best and is most  comfortable for you Knee exercises include:  Leg Lifts - While your knee is still immobilized in a splint or cast, you can do straight leg raises. Lift the leg to 60 degrees, hold for 3 sec, and slowly lower the leg. Repeat 10-20 times 2-3 times daily. Perform this exercise against resistance later as your knee gets better.   Quad and Hamstring Sets - Tighten up the muscle on the front of the thigh (Quad) and hold for 5-10 sec. Repeat this 10-20 times hourly. Hamstring sets are done by pushing the foot backward against an object and holding for 5-10 sec. Repeat as with quad sets.  A rehabilitation program following serious knee injuries can speed recovery and prevent re-injury in the future due to weakened muscles. Contact your doctor or a physical therapist for more information on knee rehabilitation. MAKE SURE YOU:   Understand these instructions.   Will watch your condition.   Will get help right away if you are not doing well or get worse.  Document Released: 05/15/2005 Document Revised: 05/04/2011 Document Reviewed: 11/02/2006 [employer] Patient Information 2012 [employer].  Pick up stool softner and laxative for home. Do not submerge incision under water. May shower. Continue to use ice for pain and swelling from surgery.  

## 2011-08-10 NOTE — Progress Notes (Signed)
OT screen  OT order received. Pt ready to D/C. Wife to help as needed. No OT needs.  [facility], OTR/L  [phone] 08/10/2011

## 2011-08-10 NOTE — Progress Notes (Signed)
Physical Therapy Treatment Patient Details Name: JOYCE LECKEY MRN: [id number] DOB: [birth year] Today's Date: 08/10/2011 [id number] PT Assessment/Plan  PT - Assessment/Plan Comments on Treatment Session: pt more alert, pts wife has not been present for discussion of DC. will practice steps when she arrives.  pt is sleeping a lot but does arouse.Marland Kitchen PT Plan: Discharge plan remains appropriate Follow Up Recommendations: Home health PT Equipment Recommended: Rolling walker with 5" wheels PT Goals  Acute Rehab PT Goals Pt will go Supine/Side to Sit: with supervision PT Goal: Supine/Side to Sit - Progress: Progressing toward goal Pt will go Sit to Stand: with supervision PT Goal: Sit to Stand - Progress: Progressing toward goal Pt will go Stand to Sit: with supervision PT Goal: Stand to Sit - Progress: Progressing toward goal Pt will Ambulate: >150 feet;with supervision;with rolling walker PT Goal: Ambulate - Progress: Progressing toward goal Pt will Perform Home Exercise Program: with supervision, verbal cues required/provided PT Goal: Perform Home Exercise Program - Progress: Progressing toward goal  PT Treatment Precautions/Restrictions  Precautions Precautions: Knee Required Braces or Orthoses: Yes Restrictions Weight Bearing Restrictions: Yes RLE Weight Bearing: Weight bearing as tolerated Mobility (including Balance) Bed Mobility Supine to Sit: 4: Min assist;HOB elevated (Comment degrees) Supine to Sit Details (indicate cue type and reason): pt able to slide RLE to edge of bed Transfers Sit to Stand: 4: Min assist Sit to Stand Details (indicate cue type and reason): vc to push from bed, more steadiness today upon standing. Stand to Sit: To chair/3-in-1;With upper extremity assist;5: Supervision Stand to Sit Details: vc top step out RLE and reach to recliner Ambulation/Gait Ambulation/Gait: Yes Ambulation/Gait Assistance: 4: Min assist Ambulation/Gait Assistance Details  (indicate cue type and reason): pt less shakey today. pt able to tolerate weight,  Ambulation Distance (Feet): 150 Feet Assistive device: Rolling walker Gait Pattern: Step-through pattern    Exercise  Total Joint Exercises Quad Sets: AROM;Both;10 reps;Supine Short Arc Quad: AAROM;Right;10 reps;Supine Heel Slides: AAROM;Right;10 reps;Supine Hip ABduction/ADduction: AAROM;Right;10 reps;Supine Straight Leg Raises: AAROM;Right;10 reps End of Session PT - End of Session Activity Tolerance: Patient tolerated treatment well Patient left: in bed;with call bell in reach Nurse Communication: Mobility status for transfers General Behavior During Session: [facility] for tasks performed Cognition: [facility] for tasks performed  Rada Hay 08/10/2011, 12:47 PM

## 2011-08-10 NOTE — Progress Notes (Signed)
CARE MANAGEMENT NOTE 08/10/2011  Patient:  Ethan Robinson, Ethan Robinson   Account Number:  [id number]  Date Initiated:  08/08/2011  Documentation initiated by:  Colleen Can  Subjective/Objective Assessment:   DX OSTEOARTHRITIS RT KNEE: TOTAL KNEE REPLACEMNT     Action/Plan:   CM spoke with patient. Pt states he wishes to go back to his home in [place] where spouse will be caregiver. States he will need RW. States he wants [facility] agency that is in network . List of choice agencies placed in shadow chart   Anticipated DC Date:  08/10/2011   Anticipated DC Plan:  HOME W HOME HEALTH SERVICES  In-house referral  Clinical Social Worker      DC Associate Professor  CM consult      [facility] Choice  HOME HEALTH  DURABLE MEDICAL EQUIPMENT   Choice offered to / List presented to:  C-1 Patient   DME arranged  [facility] Hurst      DME agency  [facility]     HH arranged  HH-2 PT      [facility] agency  [facility]   Status of service:  Completed, signed off Medicare Important Message given?  NA - LOS <3 / Initial given by admissions (If response is "NO", the following Medicare IM given date fields will be blank) Date Medicare IM given:   Date Additional Medicare IM given:    Discharge Disposition:  HOME W HOME HEALTH SERVICES  Comments:  08/10/2010 Colleen Can, RN BSN CCM [phone] Pt for discharge today with [facility] in place. Start of service within 48hrs of discharge.

## 2011-08-10 NOTE — Progress Notes (Signed)
Physical Therapy Treatment Patient Details Name: Ethan Robinson MRN: [id number] DOB: [birth year] Today's Date: 08/10/2011  PT Assessment/Plan  PT - Assessment/Plan Comments on Treatment Session: wife present for instruction on stairs, placement of KI, KI to be worn when ambulating until HHPT states OK to DC.  pt did well on steps but will need 2 person assist. see steps dialogue. pt for dc. pt/wife decline need for EMS nor SNF. PT Plan: Discharge plan remains appropriate Follow Up Recommendations: Home health PT Equipment Recommended: Rolling walker with 5" wheels PT Goals  Acute Rehab PT Goals Pt will go Supine/Side to Sit: with supervision PT Goal: Supine/Side to Sit - Progress: Progressing toward goal Pt will go Sit to Stand: with supervision PT Goal: Sit to Stand - Progress: Progressing toward goal Pt will go Stand to Sit: with supervision PT Goal: Stand to Sit - Progress: Progressing toward goal Pt will Ambulate: >150 feet;with supervision;with rolling walker PT Goal: Ambulate - Progress: Progressing toward goal Pt will Go Up / Down Stairs: 3-5 stairs;with min assist;with least restrictive assistive device PT Goal: Up/Down Stairs - Progress: Progressing toward goal Pt will Perform Home Exercise Program: with supervision, verbal cues required/provided PT Goal: Perform Home Exercise Program - Progress: Progressing toward goal  PT Treatment Precautions/Restrictions  Precautions Precautions: Knee Required Braces or Orthoses: Yes Restrictions Weight Bearing Restrictions: Yes RLE Weight Bearing: Weight bearing as tolerated Mobility (including Balance) Bed Mobility Supine to Sit: 4: Min assist;HOB elevated (Comment degrees) Supine to Sit Details (indicate cue type and reason): pt able to slide RLE to edge of bed Transfers Sit to Stand: 4: Min assist Sit to Stand Details (indicate cue type and reason): vc to push up from chair. wife  Stand to Sit: To chair/3-in-1;With upper  extremity assist;5: Supervision Stand to Sit Details: vc for Placing RLE forward prior to sitting down. Ambulation/Gait Ambulation/Gait: Yes Ambulation/Gait Assistance: 4: Min assist Ambulation/Gait Assistance Details (indicate cue type and reason): wife present for instruction pn safety, need to guard pt every time pt gets up. Ambulation Distance (Feet): 250 Feet Assistive device: Rolling walker Gait Pattern: Step-through pattern Stairs: Yes Stairs Assistance: 1: +2 Total assist Stairs Assistance Details (indicate cue type and reason): pt= 70 %. wife present for instruction on stairs w/ 1 rail/1crutch. pt requires mod a. discussed w/ pt/wife need for pt to have  a second person for safety to help on stairs.  wife to ask neighbor. offered EMS but declined. pt did ok with [facility], will have [facility] practice more. feel pt will be able to get into home w/ 2 assists w/ crutches.  wife does not have phone # for neighbors, but states they are ususally home. Stair Management Technique: One rail Left;Forwards;With crutches Number of Stairs: 4  Height of Stairs: 6     Exercise  Total Joint Exercises Quad Sets: AROM;Both;10 reps;Supine Short Arc Quad: AAROM;Right;10 reps;Supine Heel Slides: AAROM;Right;10 reps;Supine Hip ABduction/ADduction: AAROM;Right;10 reps;Supine Straight Leg Raises: AAROM;Right;10 reps End of Session PT - End of Session Equipment Utilized During Treatment: Right knee immobilizer Activity Tolerance: Patient tolerated treatment well Patient left: in chair;with family/visitor present Nurse Communication: Mobility status for transfers General Behavior During Session: [facility] for tasks performed Cognition: [facility] for tasks performed  Rada Hay 08/10/2011, 1:40 PM

## 2011-08-23 ENCOUNTER — Encounter (HOSPITAL_COMMUNITY): Payer: Self-pay | Admitting: Orthopedic Surgery

## 2011-08-30 DIAGNOSIS — Z9289 Personal history of other medical treatment: Secondary | ICD-10-CM

## 2011-08-30 NOTE — Discharge Summary (Signed)
Physician Discharge Summary   Patient ID: Ethan Robinson MRN: [id number] DOB/AGE: 75-Nov-1936 43 y.o.  Admit date: 08/07/2011 Discharge date: 08/10/2011  Primary Diagnosis: Osteoarthritis Right Knee   Admission Diagnoses: Past Medical History  Diagnosis Date  . Diverticulosis 2004    [facility]; due 2014  . Other and unspecified hyperlipidemia   . CAD (coronary artery disease)     Dr Gala Romney  . Unspecified essential hypertension   . Anemia 2010    H/H  12.9/38  . GERD (gastroesophageal reflux disease)     NO MEDS  . RA (rheumatoid arthritis)     Dr Dierdre Forth  AND OA BOTH KNEES AND HANDS    Discharge Diagnoses:  Principal Problem:  *OA (osteoarthritis) of knee Active Problems:  Postop Hyponatremia  Postop Acute blood loss anemia   Procedure: Procedure(s) (LRB): TOTAL KNEE ARTHROPLASTY (Right)   Consults: None  HPI: Ethan Robinson is a 77 y.o. year old male with end stage OA of his right knee with progressively worsening pain and dysfunction. He has constant pain, with activity and at rest and significant functional deficits with difficulties even with ADLs. He has had extensive non-op management including analgesics, injections of cortisone and viscosupplements, and home exercise program, but remains in significant pain with significant dysfunction. Radiographs show bone on bone arthritis of the medial compartment with varus deformity. He presents now for right Total Knee Arthroplasty.  Laboratory Data: Hospital Outpatient Visit on 07/28/2011  Component Date Value Range Status  . MRSA, PCR  07/28/2011 NEGATIVE  NEGATIVE Final  . Staphylococcus aureus  07/28/2011 NEGATIVE  NEGATIVE Final   Comment:                                 The Xpert SA Assay (FDA                          approved for NASAL specimens                          only), is one component of                          a comprehensive surveillance                          program.  It is not intended                           to diagnose infection nor to                          guide or monitor treatment.  Marland Kitchen aPTT (seconds) 07/28/2011 30  24-37 Final  . WBC (K/uL) 07/28/2011 7.4  4.0-10.5 Final  . RBC (MIL/uL) 07/28/2011 3.73* 4.22-5.81 Final  . Hemoglobin (g/dL) 78/29/5621 30.8* 65.7-84.6 Final  . HCT (%) 07/28/2011 36.2* 39.0-52.0 Final  . MCV (fL) 07/28/2011 97.1  78.0-100.0 Final  . MCH (pg) 07/28/2011 32.4  26.0-34.0 Final  . MCHC (g/dL) 96/29/5284 13.2  44.0-10.2 Final  . RDW (%) 07/28/2011 13.2  11.5-15.5 Final  . Platelets (K/uL) 07/28/2011 275  150-400 Final  . Sodium (mEq/L) 07/28/2011 135  135-145 Final  . Potassium (mEq/L) 07/28/2011 4.4  3.5-5.1 Final  .  Chloride (mEq/L) 07/28/2011 101  96-112 Final  . CO2 (mEq/L) 07/28/2011 27  19-32 Final  . Glucose, Bld (mg/dL) 16/02/9603 91  54-09 Final  . BUN (mg/dL) 81/19/1478 15  2-95 Final  . Creatinine, Ser (mg/dL) 62/13/0865 7.84  6.96-2.95 Final  . Calcium (mg/dL) 28/41/3244 01.0  2.7-25.3 Final  . Total Protein (g/dL) 66/44/0347 7.5  4.2-5.9 Final  . Albumin (g/dL) 56/38/7564 4.1  3.3-2.9 Final  . AST (U/L) 07/28/2011 27  0-37 Final  . ALT (U/L) 07/28/2011 20  0-53 Final  . Alkaline Phosphatase (U/L) 07/28/2011 33* 39-117 Final  . Total Bilirubin (mg/dL) 51/88/4166 0.3  0.6-3.0 Final  . GFR calc non Af Amer (mL/min) 07/28/2011 72* >90 Final  . GFR calc Af Amer (mL/min) 07/28/2011 83* >90 Final   Comment:                                 The eGFR has been calculated                          using the CKD EPI equation.                          This calculation has not been                          validated in all clinical                          situations.                          eGFR's persistently                          <90 mL/min signify                          possible Chronic Kidney Disease.  Marland Kitchen Prothrombin Time (seconds) 07/28/2011 14.0  11.6-15.2 Final  . INR  07/28/2011 1.06  0.00-1.49 Final  . Color,  Urine  07/28/2011 YELLOW  YELLOW Final  . APPearance  07/28/2011 CLEAR  CLEAR Final  . Specific Gravity, Urine  07/28/2011 1.021  1.005-1.030 Final  . pH  07/28/2011 7.0  5.0-8.0 Final  . Glucose, UA (mg/dL) 16/05/930 NEGATIVE  NEGATIVE Final  . Hgb urine dipstick  07/28/2011 NEGATIVE  NEGATIVE Final  . Bilirubin Urine  07/28/2011 NEGATIVE  NEGATIVE Final  . Ketones, ur (mg/dL) 35/57/3220 NEGATIVE  NEGATIVE Final  . Protein, ur (mg/dL) 25/42/7062 NEGATIVE  NEGATIVE Final  . Urobilinogen, UA (mg/dL) 37/62/8315 0.2  1.7-6.1 Final  . Nitrite  07/28/2011 NEGATIVE  NEGATIVE Final  . Leukocytes, UA  07/28/2011 NEGATIVE  NEGATIVE Final   MICROSCOPIC NOT DONE ON URINES WITH NEGATIVE PROTEIN, BLOOD, LEUKOCYTES, NITRITE, OR GLUCOSE <1000 mg/dL.   No results found for this basename: HGB:5 in the last 72 hours No results found for this basename: WBC:2,RBC:2,HCT:2,PLT:2 in the last 72 hours No results found for this basename: NA:2,K:2,CL:2,CO2:2,BUN:2,CREATININE:2,GLUCOSE:2,CALCIUM:2 in the last 72 hours No results found for this basename: LABPT:2,INR:2 in the last 72 hours  X-Rays:No results found.  EKG: Orders placed in visit on 06/27/11  . EKG 12-LEAD     Hospital Course:  Patient was admitted to [facility] and taken to the OR and underwent the above state procedure without complications.  Patient tolerated the procedure well and was later transferred to the recovery room and then to the orthopaedic floor for postoperative care.  They were given PO and IV analgesics for pain control following their surgery.  They were given 24 hours of postoperative antibiotics and started on DVT prophylaxis.   PT and OT were ordered for total joint protocol.  Discharge planning consulted to help with postop disposition and equipment needs.  Patient had a decent night on the evening of surgery and started to get up with therapy on day one.  PCA Morphine was discontinued and they were weaned over to PO meds.   Hemovac drain was pulled without difficulty.  Continued to progress with therapy into day two but did had dizziness.  Dressing was changed on day two and the incision was healing well. Blood was given. By day three, the patient had progressed with therapy and meeting goals and felt better after receiving blood.  Incision was healing well.  Patient was seen in rounds and was ready to go home.  Discharge Medications: Prior to Admission medications   Medication Sig Start Date End Date Taking? Authorizing Provider  metoprolol succinate (TOPROL-XL) 25 MG 24 hr tablet Take 25 mg by mouth daily before breakfast.   Yes Historical Provider, MD  PARoxetine (PAXIL) 20 MG tablet Take 20 mg by mouth every morning.   Yes Historical Provider, MD  Polyethyl Glycol-Propyl Glycol (SYSTANE) 0.4-0.3 % SOLN Apply 1 drop to eye 2 (two) times daily.   Yes Historical Provider, MD  rosuvastatin (CRESTOR) 20 MG tablet Take 20 mg by mouth at bedtime. 11/08/10  Yes Pecola Lawless, MD  Tamsulosin HCl (FLOMAX) 0.4 MG CAPS  11/08/10  Yes Pecola Lawless, MD  glucose blood (FREESTYLE TEST STRIPS) test strip 1 each by Other route as needed. Use as instructed     Historical Provider, MD  iron polysaccharides (NIFEREX) 150 MG capsule Take 1 capsule (150 mg total) by mouth daily. 08/10/11 08/09/12  Treyson Axel, PA  Lancets (FREESTYLE) lancets 1 each by Other route as needed. Use as instructed     Historical Provider, MD  rivaroxaban (XARELTO) 10 MG TABS tablet Take 1 tablet (10 mg total) by mouth daily with breakfast. Take for two and a half more weeks and then discontinue. Patient may resume his daily aspirin after completing the Xarelto. 08/10/11   Edge Mauger Julien Girt, PA  sulfaSALAzine (AZULFIDINE) 500 MG tablet Take 1,000 mg by mouth 2 (two) times daily.     Historical Provider, MD    Diet: heart healthy Activity:WBAT Follow-up:in 2 weeks Disposition: Home Discharged Condition: good   Discharge Orders    Future  Orders Please Complete By Expires   Diet - low sodium heart healthy      Call MD / Call 911      Comments:   If you experience chest pain or shortness of breath, CALL 911 and be transported to the hospital emergency room.  If you develope a fever above 101 F, pus (white drainage) or increased drainage or redness at the wound, or calf pain, call your surgeon's office.   Constipation Prevention      Comments:   Drink plenty of fluids.  Prune juice may be helpful.  You may use a stool softener, such as Colace (over the counter) 100 mg twice a day.  Use MiraLax (over the counter)  for constipation as needed.   Increase activity slowly as tolerated      Weight Bearing as taught in Physical Therapy      Comments:   Use a walker or crutches as instructed.   Discharge instructions      Comments:   Pick up stool softner and laxative for home. Do not submerge incision under water. May shower. Continue to use ice for pain and swelling from surgery.    Driving restrictions      Comments:   No driving   Lifting restrictions      Comments:   No lifting   TED hose      Comments:   Use stockings (TED hose) for 3 weeks on both leg(s).  You may remove them at night for sleeping.   Change dressing      Comments:   Change dressing  daily with sterile 4 x 4 inch gauze dressing and apply TED hose.   Do not put a pillow under the knee. Place it under the heel.        Medication List  As of 08/30/2011  8:53 PM   STOP taking these medications         aspirin 325 MG tablet      CALCIUM 600 PO      cholecalciferol 1000 UNITS tablet      CoQ10 100 MG Caps      FISH OIL MAXIMUM STRENGTH 1200 MG Caps      folic acid 1 MG tablet      Ginkgo Biloba 40 MG Tabs      hydroxychloroquine 200 MG tablet      methotrexate 1 G injection      multivitamin tablet         TAKE these medications         freestyle lancets   1 each by Other route as needed. Use as instructed      FREESTYLE TEST STRIPS  test strip   Generic drug: glucose blood   1 each by Other route as needed. Use as instructed      iron polysaccharides 150 MG capsule   Commonly known as: NIFEREX   Take 1 capsule (150 mg total) by mouth daily.      metoprolol succinate 25 MG 24 hr tablet   Commonly known as: TOPROL-XL   Take 25 mg by mouth daily before breakfast.      PARoxetine 20 MG tablet   Commonly known as: PAXIL   Take 20 mg by mouth every morning.      rivaroxaban 10 MG Tabs tablet   Commonly known as: XARELTO   Take 1 tablet (10 mg total) by mouth daily with breakfast. Take for two and a half more weeks and then discontinue. Patient may resume his daily aspirin after completing the Xarelto.      rosuvastatin 20 MG tablet   Commonly known as: CRESTOR   Take 20 mg by mouth at bedtime.      sulfaSALAzine 500 MG tablet   Commonly known as: AZULFIDINE   Take 1,000 mg by mouth 2 (two) times daily.      SYSTANE 0.4-0.3 % Soln   Generic drug: Polyethyl Glycol-Propyl Glycol   Apply 1 drop to eye 2 (two) times daily.      Tamsulosin HCl 0.4 MG Caps   Commonly known as: FLOMAX           Follow-up Information    Follow up with Loanne Drilling,  MD. Schedule an appointment as soon as possible for a visit in 2 weeks.   Contact information:   [facility] [address] [place] [address] [phone]          Signed: Patrica Duel 08/30/2011, 8:53 PM

## 2011-10-31 ENCOUNTER — Other Ambulatory Visit: Payer: Self-pay | Admitting: Internal Medicine

## 2011-11-20 ENCOUNTER — Encounter: Payer: Self-pay | Admitting: Internal Medicine

## 2011-11-20 ENCOUNTER — Ambulatory Visit (INDEPENDENT_AMBULATORY_CARE_PROVIDER_SITE_OTHER): Payer: Medicare Other | Admitting: Internal Medicine

## 2011-11-20 VITALS — BP 120/70 | HR 64 | Temp 98.1°F | Wt 187.0 lb

## 2011-11-20 DIAGNOSIS — J069 Acute upper respiratory infection, unspecified: Secondary | ICD-10-CM

## 2011-11-20 MED ORDER — HYDROCODONE-HOMATROPINE 5-1.5 MG/5ML PO SYRP: 5.0000 mL | ORAL_SOLUTION | Freq: Every evening | ORAL | Status: AC | PRN

## 2011-11-20 MED ORDER — DOXYCYCLINE HYCLATE 100 MG PO TABS: 100.0000 mg | ORAL_TABLET | Freq: Two times a day (BID) | ORAL | Status: AC

## 2011-11-20 NOTE — Patient Instructions (Addendum)
Rest, fluids , tylenol For cough, take Mucinex DM twice a day as needed  If the cough persist at night, use hydrocodone, will make you sleepy Take the antibiotic as prescribed  (doxycycline) only if no better in few days Call if no better in few days Call anytime if the symptoms are severe

## 2011-11-20 NOTE — Progress Notes (Signed)
  Subjective:    Patient ID: Ethan Robinson, male    DOB: 11/11/1934, 77 y.o.   MRN: [id number]  HPI Acute visit One-week history of dry cough, scratchy throat, head and chest congestion. Taking Robitussin without much improvement. Cough is quite intense at nighttime.  PMH--- reviewed  Review of Systems Denies fever or chills No chest pain, shortness of breath only with episodes of cough. Mild wheezing sometimes. He quit tobacco a long time ago, no history of emphysema, mild asthma?.    Objective:   Physical Exam General -- alert, well-developed. No apparent distress.  HEENT -- TMs normal, throat w/o redness, face symmetric and not tender to palpation, nose not congested  Lungs -- normal respiratory effort, no intercostal retractions, no accessory muscle use, and few rhonchi with cough only.  No wheezing  Heart-- normal rate, regular rhythm, no murmur, and no gallop.   Extremities-- no pretibial edema bilaterally  Neurologic-- alert & oriented X3 and strength normal in all extremities. Psych-- Cognition and judgment appear intact. Alert and cooperative with normal attention span and concentration.  not anxious appearing and not depressed appearing.       Assessment & Plan:   URI-mild bronchitis, See instructions

## 2011-11-23 ENCOUNTER — Ambulatory Visit (INDEPENDENT_AMBULATORY_CARE_PROVIDER_SITE_OTHER): Payer: Medicare Other | Admitting: Internal Medicine

## 2011-11-23 ENCOUNTER — Encounter: Payer: Self-pay | Admitting: Internal Medicine

## 2011-11-23 VITALS — BP 122/70 | HR 61 | Temp 99.0°F | Wt 187.6 lb

## 2011-11-23 DIAGNOSIS — I10 Essential (primary) hypertension: Secondary | ICD-10-CM

## 2011-11-23 DIAGNOSIS — J209 Acute bronchitis, unspecified: Secondary | ICD-10-CM

## 2011-11-23 DIAGNOSIS — E785 Hyperlipidemia, unspecified: Secondary | ICD-10-CM

## 2011-11-23 DIAGNOSIS — J310 Chronic rhinitis: Secondary | ICD-10-CM

## 2011-11-23 MED ORDER — PREDNISONE 20 MG PO TABS: 20.0000 mg | ORAL_TABLET | Freq: Two times a day (BID) | ORAL | Status: AC

## 2011-11-23 MED ORDER — IPRATROPIUM BROMIDE 0.02 % IN SOLN
0.5000 mg | Freq: Once | RESPIRATORY_TRACT | Status: AC
  Administered 2011-11-23: 0.5 mg via RESPIRATORY_TRACT

## 2011-11-23 MED ORDER — AZITHROMYCIN 250 MG PO TABS: ORAL_TABLET | ORAL | Status: AC

## 2011-11-23 MED ORDER — ALBUTEROL SULFATE (5 MG/ML) 0.5% IN NEBU
2.5000 mg | INHALATION_SOLUTION | Freq: Once | RESPIRATORY_TRACT | Status: AC
  Administered 2011-11-23: 2.5 mg via RESPIRATORY_TRACT

## 2011-11-23 MED ORDER — FLUTICASONE PROPIONATE 50 MCG/ACT NA SUSP: 1.0000 | Freq: Two times a day (BID) | NASAL | Status: DC | PRN

## 2011-11-23 NOTE — Patient Instructions (Addendum)
Plain Mucinex for thick secretions ;force NON dairy fluids . Use a Neti pot daily as needed for sinus congestion; going from open side to congested side . Nasal cleansing in the shower as discussed. Make sure that all residual soap is removed to prevent irritation. Fluticasone 1 spray in each nostril twice a day as needed. Use the "crossover" technique as discussed. Plain Allegra 160 daily as needed for itchy eyes & sneezing.    

## 2011-11-23 NOTE — Progress Notes (Signed)
Subjective:    Patient ID: Ethan Robinson, male    DOB: 24-Dec-1934, 77 y.o.   MRN: [id number]  HPI He has a note from [facility] assessing the need for certain evaluations and studies. The report was scanned.  He is on Paxil 20 mg daily; dose change was questioned. He does question whether he actually needs the medication or not . He does describes depression at times.  Possible therapy for neuropathy of the feet was mentioned; he is not anxious to start another medication  Question of change in therapy for his  knee pain was mentioned. This would be deferred to his orthopedist  There is a mention of trace proteinuria. Urinalysis 07/28/11 was normal with no evidence of protein. Renal function studies were normal on 3 occasions in March of this year.    Evaluation for glaucoma is also mentioned. He was supposed to have an eye exam but this was deferred because of his knee surgery. He should re schedule this.  His lipid status was also questioned. Lipids were @ goal in January of this year.    Review of Systems  Cough has been present chronically  intermittently but it when it flared this week. He has had some postnasal drainage.He denies significant extrinsic symptoms .The cough is mainly dry; he has scant sputum production. He has nasal congestion but denies nasal purulence, facial pain,or frontal headaches. He was unaware of any fever. His wife was recently ill with a respiratory tract infection.  Chest x-ray perioperatively 07/28/11 revealed no active lung process. There is linear scarring primarily in the right mid lung &  at the lung bases. Mild cardiomegaly was noted       Objective:   Physical Exam  General appearance:good health ;well nourished; no acute distress or increased work of breathing is present. Tremor of head & hands. No  lymphadenopathy about the head, neck, or axilla noted.   Eyes: No conjunctival inflammation or lid edema is present. There is no scleral  icterus.  Ears:  External ear exam shows no significant lesions or deformities.  Otoscopic examination reveals clear canals, tympanic membranes are intact bilaterally without bulging, retraction, inflammation or discharge.  Nose:  External nasal examination shows no deformity or inflammation. Nasal mucosa are dry without lesions or exudates. Slight septal dislocation or deviation.No obstruction to airflow.   Oral exam: Dental hygiene is good; lips and gums are healthy appearing.There is no oropharyngeal erythema or exudate noted.   Neck:  No deformities, thyromegaly, masses, or tenderness noted.   Heart:  Normal rate and regular rhythm. S1 and S2 normal without gallop, murmur, click, rub or other extra sounds.   Lungs:Chest clear to auscultation; no wheezes, rhonchi,or rubs present. He has minimal rales at the bases. No increased work of breathing. With several deep inspirations he began to cough which induced some upper airway bronchospasm. Inhaled bronchodilators were administered with complete resolution.  Extremities:  No cyanosis, edema, or clubbing  noted    Skin: Warm & dry w/o jaundice or tenting.          Assessment & Plan:  #1Cough related to reactive airways disease.Chest x-rays stable. Pulmonary function test indicated if symptoms persist.  #2 dyslipidemia; lipids that goal  #3 a decrease in his Paxil to 10 mg was discussed; He was noncommittal  #4Knee pain, as per orthopedist   #5 Possible neuropathy; Gabapentin could be considered if he changes his mind #6 hypotension, control. No added salt @ table recommended  Plan: See orders and recommendations

## 2011-12-01 ENCOUNTER — Other Ambulatory Visit: Payer: Self-pay | Admitting: Internal Medicine

## 2011-12-01 NOTE — Telephone Encounter (Signed)
Refill done.  

## 2011-12-28 ENCOUNTER — Other Ambulatory Visit: Payer: Self-pay | Admitting: Internal Medicine

## 2012-01-22 ENCOUNTER — Encounter: Payer: Self-pay | Admitting: Family Medicine

## 2012-01-22 ENCOUNTER — Ambulatory Visit (INDEPENDENT_AMBULATORY_CARE_PROVIDER_SITE_OTHER): Payer: Medicare Other | Admitting: Family Medicine

## 2012-01-22 VITALS — BP 130/90 | HR 65 | Temp 98.2°F | Ht 70.5 in | Wt 191.2 lb

## 2012-01-22 DIAGNOSIS — K120 Recurrent oral aphthae: Secondary | ICD-10-CM

## 2012-01-22 NOTE — Assessment & Plan Note (Signed)
New.  Pt's mouth sores consistent w/ aphthous ulcers- cause unknown.  No evidence of infxn or oral cancer.  Reviewed supportive care and red flags that should prompt return.  Pt expressed understanding and is in agreement w/ plan.

## 2012-01-22 NOTE — Patient Instructions (Addendum)
This is an aphthous ulcer and should get better w/ time Continue the Orajel or similar product to improve pain ([employer] makes one that forms a paste) If no improvement- please call your dentist Sherri Rad in there!  Canker Sores  Canker sores are painful, open sores on the inside of the mouth and cheek. They may be white or yellow. The sores usually heal in 1 to 2 weeks. Women are more likely than men to have recurrent canker sores. CAUSES The cause of canker sores is not well understood. More than one cause is likely. Canker sores do not appear to be caused by certain types of germs (viruses or bacteria). Canker sores may be caused by:  An allergic reaction to certain foods.   Digestive problems.   Not having enough vitamin B12, folic acid, and iron.   Male sex hormones. Sores may come only during certain phases of a menstrual cycle. Often, there is improvement during pregnancy.   Genetics. Some people seem to inherit canker sore problems.  Emotional stress and injuries to the mouth may trigger outbreaks, but not cause them.  DIAGNOSIS Canker sores are diagnosed by exam.  TREATMENT  Patients who have frequent bouts of canker sores may have cultures taken of the sores, blood tests, or allergy tests. This helps determine if their sores are caused by a poor diet, an allergy, or some other preventable or treatable disease.   Vitamins may prevent recurrences or reduce the severity of canker sores in people with poor nutrition.   Numbing ointments can relieve pain. These are available in drug stores without a prescription.   Anti-inflammatory steroid mouth rinses or gels may be prescribed by your caregiver for severe sores.   Oral steroids may be prescribed if you have severe, recurrent canker sores. These strong medicines can cause many side effects and should be used only under the close direction of a dentist or physician.   Mouth rinses containing the antibiotic medicine may be  prescribed. They may lessen symptoms and speed healing.  Healing usually happens in about 1 or 2 weeks with or without treatment. Certain antibiotic mouth rinses given to pregnant women and young children can permanently stain teeth. Talk to your caregiver about your treatment. HOME CARE INSTRUCTIONS   Avoid foods that cause canker sores for you.   Avoid citrus juices, spicy or salty foods, and coffee until the sores are healed.   Use a soft-bristled toothbrush.   Chew your food carefully to avoid biting your cheek.   Apply topical numbing medicine to the sore to help relieve pain.   Apply a thin paste of baking soda and water to the sore to help heal the sore.   Only use mouth rinses or medicines for pain or discomfort as directed by your caregiver.  SEEK MEDICAL CARE IF:   Your symptoms are not better in 1 week.   Your sores are still present after 2 weeks.   Your sores are very painful.   You have trouble breathing or swallowing.   Your sores come back frequently.  Document Released: 09/09/2010 Document Revised: 05/04/2011 Document Reviewed: 09/09/2010 [facility] Patient Information 2012 [place], [place].

## 2012-01-22 NOTE — Progress Notes (Signed)
  Subjective:    Patient ID: Ethan Robinson, male    DOB: [birth year], 77 y.o.   MRN: [id number]  HPI Mouth sore- sxs x1 week.  Not improving.  Pt reports this is on roof of mouth, behind teeth.  No recollection of burning mouth.  Has been using orajel w/ some relief.  No new meds recently.  No similar areas elsewhere in mouth or other mucous membranes.   Review of Systems For ROS see HPI     Objective:   Physical Exam  Vitals reviewed. Constitutional: He appears well-developed and well-nourished. No distress.  HENT:  Mouth/Throat: Oral lesions present. Abnormal dentition (poor dentition). No dental abscesses or lacerations.            Assessment & Plan:

## 2012-01-24 ENCOUNTER — Other Ambulatory Visit: Payer: Self-pay | Admitting: Internal Medicine

## 2012-02-25 ENCOUNTER — Other Ambulatory Visit: Payer: Self-pay | Admitting: Internal Medicine

## 2012-03-29 ENCOUNTER — Other Ambulatory Visit: Payer: Self-pay | Admitting: Internal Medicine

## 2012-04-02 ENCOUNTER — Ambulatory Visit (INDEPENDENT_AMBULATORY_CARE_PROVIDER_SITE_OTHER): Payer: Medicare Other | Admitting: Internal Medicine

## 2012-04-02 ENCOUNTER — Encounter: Payer: Self-pay | Admitting: Internal Medicine

## 2012-04-02 VITALS — BP 136/80 | HR 64 | Temp 98.3°F | Wt 197.6 lb

## 2012-04-02 DIAGNOSIS — J209 Acute bronchitis, unspecified: Secondary | ICD-10-CM

## 2012-04-02 MED ORDER — AZITHROMYCIN 250 MG PO TABS: ORAL_TABLET | ORAL | Status: DC

## 2012-04-02 MED ORDER — BUDESONIDE-FORMOTEROL FUMARATE 160-4.5 MCG/ACT IN AERO: 2.0000 | INHALATION_SPRAY | Freq: Two times a day (BID) | RESPIRATORY_TRACT | Status: DC

## 2012-04-02 MED ORDER — HYDROCODONE-HOMATROPINE 5-1.5 MG/5ML PO SYRP: 5.0000 mL | ORAL_SOLUTION | Freq: Four times a day (QID) | ORAL | Status: DC | PRN

## 2012-04-02 NOTE — Progress Notes (Signed)
  Subjective:    Patient ID: Ethan Robinson, male    DOB: September 06, 1934, 77 y.o.   MRN: [id number]  HPI Onset 10/27 as chest congestion with  yellow sputum. Today he experienced shortness of breath following paroxysms of cough. He does not have a rescue inhaler on hand.  He has not smoked since 1974 but did smoke up to 2 packs per day for multiple decades.  His last chest x-ray 07/28/11 revealed linear scarring predominantly in the right midlung feel and at the lung bases. This was stable compared to 01/11/10. He did have mild cardiomegaly    Review of Systems He denies fever, chills, sweats, frontal headache, facial pain, or significant purulent nasal secretions.     Objective:   Physical Exam General appearance:good health ;well nourished; no acute distress or increased work of breathing is present.  No  lymphadenopathy about the head, neck, or axilla noted.   Eyes: No conjunctival inflammation or lid edema is present.   Ears:  External ear exam shows no significant lesions or deformities.  Otoscopic examination reveals clear canals, tympanic membranes are intact bilaterally without bulging, retraction, inflammation or discharge.  Nose:  External nasal examination shows no deformity or inflammation. Nasal mucosa are pink and moist without lesions or exudates. No septal dislocation or deviation.No obstruction to airflow.   Oral exam: Dental hygiene is good; lips and gums are healthy appearing.There is no oropharyngeal erythema or exudate noted. Hoarse  Neck:  No deformities, thyromegaly, masses, or tenderness noted.   Supple with full range of motion without pain.   Heart:  Normal rate and regular rhythm. S1 and S2 normal without gallop, murmur, click, rub or other extra sounds.   Lungs:Chest clear to auscultation; no wheezes, rhonchi,rales ,or rubs present.No increased work of breathing.  Breath sounds are decreased  Extremities:  No cyanosis or edema. Slight clubbing  noted     Skin: Warm & dry .         Assessment & Plan:  #1 acute bronchitis w/o bronchospasm Plan: See orders and recommendations

## 2012-04-02 NOTE — Patient Instructions (Addendum)
Symbicort  2 inhalations every 12 hours; gargle and spit after use  Review and correct the record as indicated. Please share record with all medical staff seen.

## 2012-06-27 ENCOUNTER — Other Ambulatory Visit: Payer: Self-pay | Admitting: Internal Medicine

## 2012-08-26 ENCOUNTER — Other Ambulatory Visit: Payer: Self-pay | Admitting: Internal Medicine

## 2012-08-26 ENCOUNTER — Other Ambulatory Visit: Payer: Self-pay | Admitting: Neurology

## 2012-09-18 ENCOUNTER — Telehealth: Payer: Self-pay | Admitting: Internal Medicine

## 2012-09-18 DIAGNOSIS — T887XXA Unspecified adverse effect of drug or medicament, initial encounter: Secondary | ICD-10-CM

## 2012-09-18 DIAGNOSIS — E785 Hyperlipidemia, unspecified: Secondary | ICD-10-CM

## 2012-09-18 MED ORDER — ROSUVASTATIN CALCIUM 20 MG PO TABS: ORAL_TABLET | ORAL | Status: DC

## 2012-09-18 NOTE — Telephone Encounter (Signed)
Refill: Crestor 20 mg tab. Take one tablet by mouth every day. Qty 30. Last fill 09-18-12

## 2012-09-18 NOTE — Telephone Encounter (Signed)
RX sent, patient is overdue for Lipid/Hep 272.4/995.20 (Future orders placed for [facility]J)

## 2012-09-23 ENCOUNTER — Telehealth: Payer: Self-pay | Admitting: *Deleted

## 2012-09-23 NOTE — Telephone Encounter (Signed)
Last lipid done 06-13-11, last OV 04-02-12. No pending appts.Please advise ok to fill for 90 day supply

## 2012-09-23 NOTE — Telephone Encounter (Signed)
Lipids and hepatic panel introduced future orders for/23/14. Please make sure these are scheduled. Renew medication x90 days

## 2012-09-24 MED ORDER — ROSUVASTATIN CALCIUM 20 MG PO TABS: 20.0000 mg | ORAL_TABLET | Freq: Every day | ORAL | Status: DC

## 2012-09-24 NOTE — Telephone Encounter (Signed)
Rx sent, Discuss with patient appt scheduled.

## 2012-09-25 ENCOUNTER — Other Ambulatory Visit: Payer: Self-pay | Admitting: Internal Medicine

## 2012-09-25 ENCOUNTER — Other Ambulatory Visit (INDEPENDENT_AMBULATORY_CARE_PROVIDER_SITE_OTHER): Payer: Medicare Other

## 2012-09-25 DIAGNOSIS — T887XXA Unspecified adverse effect of drug or medicament, initial encounter: Secondary | ICD-10-CM

## 2012-09-25 DIAGNOSIS — E785 Hyperlipidemia, unspecified: Secondary | ICD-10-CM

## 2012-09-25 LAB — LIPID PANEL
Cholesterol: 147 mg/dL (ref 0–200)
LDL Cholesterol: 75 mg/dL (ref 0–99)
Triglycerides: 117 mg/dL (ref 0.0–149.0)
VLDL: 23.4 mg/dL (ref 0.0–40.0)

## 2012-09-25 LAB — HEPATIC FUNCTION PANEL
ALT: 16 U/L (ref 0–53)
Albumin: 4 g/dL (ref 3.5–5.2)
Alkaline Phosphatase: 29 U/L — ABNORMAL LOW (ref 39–117)
Total Protein: 7.2 g/dL (ref 6.0–8.3)

## 2012-10-01 ENCOUNTER — Encounter: Payer: Self-pay | Admitting: Internal Medicine

## 2012-10-01 ENCOUNTER — Ambulatory Visit (INDEPENDENT_AMBULATORY_CARE_PROVIDER_SITE_OTHER): Payer: Medicare Other | Admitting: Internal Medicine

## 2012-10-01 ENCOUNTER — Ambulatory Visit (INDEPENDENT_AMBULATORY_CARE_PROVIDER_SITE_OTHER)
Admission: RE | Admit: 2012-10-01 | Discharge: 2012-10-01 | Disposition: A | Discharge status: Home / Self Care | Payer: Medicare Other | Source: Ambulatory Visit | Attending: Internal Medicine | Admitting: Internal Medicine

## 2012-10-01 VITALS — BP 128/78 | HR 63 | Temp 98.8°F | Wt 202.0 lb

## 2012-10-01 DIAGNOSIS — I2510 Atherosclerotic heart disease of native coronary artery without angina pectoris: Secondary | ICD-10-CM

## 2012-10-01 DIAGNOSIS — M546 Pain in thoracic spine: Secondary | ICD-10-CM

## 2012-10-01 DIAGNOSIS — E785 Hyperlipidemia, unspecified: Secondary | ICD-10-CM

## 2012-10-01 NOTE — Assessment & Plan Note (Signed)
LDL minimally above goal of 70 or less

## 2012-10-01 NOTE — Patient Instructions (Addendum)
Order for x-rays entered into  the computer; these will be performed at [address] [facility]. across from [facility]. No appointment is necessary.  If you activate the  My Chart system; lab & Xray results will be released directly  to you as soon as I review & address these through the computer. If you choose not to sign up for My Chart within 36 hours of labs being drawn; results will be reviewed & interpretation added before being copied & mailed, causing a delay in getting the results to you.If you do not receive that report within 7-10 days ,please call. Additionally you can use this system to gain direct  access to your records  if  out of town or @ an office of a  physician who is not in  the My Chart network.  This improves continuity of care & places you in control of your medical record.  Share results with all non [facility] medical staff seen

## 2012-10-01 NOTE — Progress Notes (Signed)
  Subjective:    Patient ID: Ethan Robinson, male    DOB: 05-Jun-1934, 78 y.o.   MRN: [id number]  HPI Dyslipidemia assessment: No Prior Advanced Lipid Testing but LDL goal < 70 with CAD   Family history of premature CAD/ MI not present  .  No specific nutrition plan  No regular Exercise due to arthritis No PMH of Diabetes ; A1c range 5.5-6.2% HTN controlled with meds No Smoking history 1957-1974 up to 2 ppd  Lab results reviewed & risks/ options discussed  He's had right posterior thoracic pain in the infrascapular area for 2-3 months. It had been intermittent until the last one-2 months. During this period it has been consistent. It is sharp to dull up to a level VII. Lying on an ice pack does seem to help. He did not mention this to his rheumatologist.  He's had a chronic nonproductive cough for over a decade. This is not associated with sputum production or hemoptysis. Sometimes the cough will aggravate the back pain  The back pain is not related to any abdominal symptoms or meal intake . This pain is not associated with any rash or radicular pain.    Review of Systems Weight stable up 3-5 pounds No significant fatigue unless physically active No chest pain  ;claudication; palpitations ; dyspnea; PND No abd pain/bowel changes ; some myalgias especially LLE @ night Some positional  lightheadedness; no syncope;  memory issues with names No skin/hair changes. Nails brittle       Objective:   Physical Exam   Appears  well-nourished & in no acute distress  No carotid bruits are present.No neck pain distention present at 10 - 15 degrees. Thyroid normal to palpation  Heart rhythm and rate are normal with Grade 1/2 over 6 systolic murmur.  Chest is clear with no increased work of breathing.  There is no pain to percussion or compression of the thorax.  There is no evidence of aortic aneurysm or renal artery bruits  Abdomen soft with no organomegaly or masses. No HJR  No  clubbing, cyanosis or edema present.  Pedal pulses are slightly decreased but intact   No ischemic skin changes are present .  No rash R infrascapular area  Alert and oriented. Strength, tone, DTRs reflexes 1/2+ @ knees          Assessment & Plan:

## 2012-12-25 ENCOUNTER — Other Ambulatory Visit: Payer: Self-pay | Admitting: Neurology

## 2012-12-25 ENCOUNTER — Other Ambulatory Visit: Payer: Self-pay | Admitting: Internal Medicine

## 2013-01-07 ENCOUNTER — Ambulatory Visit (INDEPENDENT_AMBULATORY_CARE_PROVIDER_SITE_OTHER): Payer: Medicare Other | Admitting: Internal Medicine

## 2013-01-07 ENCOUNTER — Ambulatory Visit (INDEPENDENT_AMBULATORY_CARE_PROVIDER_SITE_OTHER)
Admission: RE | Admit: 2013-01-07 | Discharge: 2013-01-07 | Disposition: A | Discharge status: Home / Self Care | Payer: Medicare Other | Source: Ambulatory Visit | Attending: Internal Medicine | Admitting: Internal Medicine

## 2013-01-07 ENCOUNTER — Encounter: Payer: Self-pay | Admitting: Internal Medicine

## 2013-01-07 VITALS — BP 128/70 | HR 97 | Temp 98.3°F | Wt 202.6 lb

## 2013-01-07 DIAGNOSIS — M1990 Unspecified osteoarthritis, unspecified site: Secondary | ICD-10-CM

## 2013-01-07 MED ORDER — LIDOCAINE 5 % EX PTCH: 1.0000 | MEDICATED_PATCH | CUTANEOUS | Status: DC

## 2013-01-07 NOTE — Patient Instructions (Addendum)
Please get your x-ray at the other [facility]  office located at: [address] [place], across from [facility].  Please go to the basement, this is a walk-in facility, they are open from 8:30 to 5:30 PM. Phone number [phone]. --- Tylenol  500 mg OTC 2 tabs a day every 8 hours as needed for pain Use 1 patch at the area of burning --- Call if no better in few days, call if area get red, swollen

## 2013-01-07 NOTE — Progress Notes (Signed)
  Subjective:    Patient ID: Ethan Robinson, male    DOB: July 31, 1934, 78 y.o.   MRN: [id number]  HPI Acute visit Several weeks history of a burning feeling in the right groin. Patient has a hard time describing the burning, can't tell if this is a deep or superficial feeling. Takes Tylenol sometimes. Sometimes the pain is worse when he walks  Past Medical History  Diagnosis Date  . Diverticulosis 2004    [facility]; due 2014  . Other and unspecified hyperlipidemia   . CAD (coronary artery disease)     Dr Gala Romney  . Unspecified essential hypertension   . Anemia 2010    H/H  12.9/38  . GERD (gastroesophageal reflux disease)     NO MEDS  . RA (rheumatoid arthritis)     Dr Dierdre Forth  AND OA BOTH KNEES AND HANDS   Past Surgical History  Procedure Laterality Date  . Tonsillectomy    . Angioplasty  1997    3 stents  . Knee surgery      x 2  . Carpal tunnel release      bilateral  . Trigger finger release      multiple  . Back surgery  1995    for ruptured disc LS spine  . Shoulder surgery  2005  . Colonoscopy  2004    Diverticulosis  . Cataract extraction, bilateral  2011  . Total knee arthroplasty  08/07/2011    Procedure: TOTAL KNEE ARTHROPLASTY;  Surgeon: Loanne Drilling, MD;  Location: [facility];  Service: Orthopedics;  Laterality: Right;     Review of Systems No abdominal pain, no rash. No nausea, vomiting, diarrhea. No mass in the groin. No back pain per se     Objective:   Physical Exam  Genitourinary:      BP 128/70  Pulse 97  Temp(Src) 98.3 F (36.8 C) (Oral)  Wt 202 lb 9.6 oz (91.899 kg)  BMI 28.65 kg/m2  SpO2 97% General -- alert, well-developed, NAD  Abdomen--soft, non-tender, no distention, no guarding, and no rigidity.   Extremities-- no pretibial edema bilaterally GU-- Inguinal area is without mass, hernia, lymphadenopathy, rash. Scrotal contents normal, penis normal without rash or discharge.  Psych-- Cognition and judgment appear intact.  Alert and cooperative with normal attention span and concentration.  not anxious appearing and not depressed appearing.       Assessment & Plan:

## 2013-01-07 NOTE — Assessment & Plan Note (Addendum)
R groin pain w/o rash, hernia or LAD on exam DJD hip? Pain is burning, trial w/ lidoderm? Initially I recommend only Tylenol but he said he has been doing that already and needs some relief. plan : tylenol prn, lidoderm If no better will call, u/s? Marland Kitchen

## 2013-01-10 ENCOUNTER — Telehealth: Payer: Self-pay | Admitting: *Deleted

## 2013-01-10 NOTE — Telephone Encounter (Signed)
Discussed with patient, verbalized understanding.  

## 2013-01-10 NOTE — Telephone Encounter (Signed)
Message copied by Shirlee More I on Fri Jan 10, 2013  9:39 AM ------      Message from: Willow Ora E      Created: Thu Jan 09, 2013  1:56 PM       Call patient, x-ray is okay, plan is the same ------

## 2013-01-15 ENCOUNTER — Ambulatory Visit (INDEPENDENT_AMBULATORY_CARE_PROVIDER_SITE_OTHER): Payer: Medicare Other | Admitting: Internal Medicine

## 2013-01-15 ENCOUNTER — Encounter: Payer: Self-pay | Admitting: Internal Medicine

## 2013-01-15 VITALS — BP 128/70 | HR 59 | Temp 98.4°F | Wt 206.4 lb

## 2013-01-15 DIAGNOSIS — M546 Pain in thoracic spine: Secondary | ICD-10-CM | POA: Insufficient documentation

## 2013-01-15 DIAGNOSIS — M5416 Radiculopathy, lumbar region: Secondary | ICD-10-CM | POA: Insufficient documentation

## 2013-01-15 DIAGNOSIS — R1013 Epigastric pain: Secondary | ICD-10-CM

## 2013-01-15 DIAGNOSIS — IMO0002 Reserved for concepts with insufficient information to code with codable children: Secondary | ICD-10-CM

## 2013-01-15 MED ORDER — PREGABALIN 75 MG PO CAPS: 75.0000 mg | ORAL_CAPSULE | Freq: Two times a day (BID) | ORAL | Status: DC

## 2013-01-15 NOTE — Patient Instructions (Addendum)
If you have pain in the abdomen; please do not eat & come in for liver function tests, amylase, and lipase.

## 2013-01-15 NOTE — Progress Notes (Signed)
Subjective:    Patient ID: Ethan Robinson, male    DOB: 1934/10/08, 78 y.o.   MRN: [id number]  HPI  He returns with the right posterior thoracic pain which he's had since at least February. It varies in intensity from dull to sharp, up to a level VII. It can radiate anteriorly.He had found that ice topically had been of benefit; but not now. Previously he questioned whether this pain might be worse with cough. At this time there is no pleuritic component to it. Chest x-ray performed in May of this year reveals bibasilar scarring and blunting the costophrenic angles. Degenerative disease is noted in the thoracic spine with osteophytes visible on lateral film. X-ray was reviewed with him  He also has a separate issue which is burning in the right inguinal area for which he was seen 01/07/13. X-rays 8/15 revealed advanced degenerative disc disease at L4-5.Film was reviewed with him.  He does have occasional constipation.   Both his mother and brother had gallbladder disease.   Review of Systems He denies fever, chills, sweats, weight loss.  He has no dysuria, pyuria, or hematuria.  There is no numbness or tingling or weakness in the leg.  He has no incontinence of urine or stool.  He has had some chronic weakness in the left leg which he relates to arthritis.  There's been no rash in either area of pain.  He denies cocaine or urine or Clay colored stool.      Objective:   Physical Exam Gen.: Adequately nourished in appearance. Alert, appropriate and cooperative throughout exam.Appears younger than stated age  Eyes: No corneal or conjunctival inflammation noted. No icterus Neck: No deformities, masses, or tenderness noted. Range of motion normal Lungs: Normal respiratory effort; chest expands symmetrically. Lungs are clear to auscultation without rales, wheezes, or increased work of breathing. Heart: Normal rate and rhythm. Normal S1 and S2. No gallop, click, or rub. No  murmur. Abdomen: Bowel sounds normal; abdomen soft and nontender. No masses, organomegaly or hernias noted. Genitalia: Genitalia normal w/o hernia. Musculoskeletal/extremities: No deformity or scoliosis noted of  the thoracic or lumbar spine.  No clubbing, cyanosis, or edema. Range of motion normal .Tone & strength  Normal. Joints  reveal marked  DJD DIP changes. Nail health good. Able to lie down & sit up w/o help. Negative SLR bilaterally Vascular: Carotid, radial artery, dorsalis pedis and  posterior tibial pulses are full and equal. No bruits present. Neurologic: Alert and oriented x3. Deep tendon reflexes symmetrical and normal.  Gait is broad. There's a slight footdrop on the right with heel walking .        Skin: Intact without suspicious lesions or rashes. No jaundice Lymph: No cervical, axillary, or inguinal lymphadenopathy present. Psych: Mood and affect are normal. Normally interactive                                                                                        Assessment & Plan:  #1 right T.-5 pain; this can radiate anteriorly. His history and exam do not suggest gallbladder disease.  #2 right L1 radicular pain. X-rays revealed degenerative changes at L4-5.  #  3 anterior radiation of pain to the epigastrium of #1. Family history of gallbladder disease in his mother and brother  Plan: Thoracic spine films and US of the gallbladder. Trial of Lyrica.

## 2013-01-17 ENCOUNTER — Ambulatory Visit (HOSPITAL_BASED_OUTPATIENT_CLINIC_OR_DEPARTMENT_OTHER)
Admission: RE | Admit: 2013-01-17 | Discharge: 2013-01-17 | Disposition: A | Discharge status: Home / Self Care | Payer: Medicare Other | Source: Ambulatory Visit | Attending: Internal Medicine | Admitting: Internal Medicine

## 2013-01-17 DIAGNOSIS — M546 Pain in thoracic spine: Secondary | ICD-10-CM

## 2013-01-17 DIAGNOSIS — K7689 Other specified diseases of liver: Secondary | ICD-10-CM | POA: Insufficient documentation

## 2013-01-17 DIAGNOSIS — R109 Unspecified abdominal pain: Secondary | ICD-10-CM | POA: Insufficient documentation

## 2013-01-17 DIAGNOSIS — R1013 Epigastric pain: Secondary | ICD-10-CM

## 2013-01-17 DIAGNOSIS — K8020 Calculus of gallbladder without cholecystitis without obstruction: Secondary | ICD-10-CM | POA: Insufficient documentation

## 2013-01-22 ENCOUNTER — Telehealth: Payer: Self-pay | Admitting: *Deleted

## 2013-01-22 DIAGNOSIS — K8020 Calculus of gallbladder without cholecystitis without obstruction: Secondary | ICD-10-CM

## 2013-01-22 NOTE — Telephone Encounter (Signed)
Message copied by Verdie Shire on Wed Jan 22, 2013 11:39 AM ------      Message from: Pecola Lawless      Created: Sun Jan 19, 2013 11:32 AM       Gallstones are present & could be causing your abdominal & thoracic back pain. [facility] consultation recommended. Hopp ------

## 2013-01-22 NOTE — Telephone Encounter (Signed)
Spoke with the pt and informed him of recent US results and note.  Pt understood and agreed to referral to gen surgery.  New referral ordered and sent.//AB/CMA

## 2013-01-26 ENCOUNTER — Other Ambulatory Visit: Payer: Self-pay | Admitting: Neurology

## 2013-01-27 HISTORY — PX: OTHER SURGICAL HISTORY: SHX169

## 2013-01-31 ENCOUNTER — Encounter (INDEPENDENT_AMBULATORY_CARE_PROVIDER_SITE_OTHER): Payer: Self-pay | Admitting: Surgery

## 2013-01-31 ENCOUNTER — Ambulatory Visit (INDEPENDENT_AMBULATORY_CARE_PROVIDER_SITE_OTHER): Payer: Medicare Other | Admitting: Surgery

## 2013-01-31 VITALS — BP 110/66 | HR 60 | Temp 97.3°F | Resp 14 | Ht 71.0 in | Wt 204.8 lb

## 2013-01-31 DIAGNOSIS — K8020 Calculus of gallbladder without cholecystitis without obstruction: Secondary | ICD-10-CM

## 2013-01-31 NOTE — Progress Notes (Signed)
Patient ID: Ethan Robinson, male   DOB: [birth year], 78 y.o.   MRN: [id number]  Chief Complaint  Patient presents with  . New Evaluation    eval gallstones    HPI Ethan Robinson is a 78 y.o. male.  Patient sent at request of Dr. Alwyn Ren for right upper quadrant and right upper back pain. Patient has history of gallstones. Pain is sharp in nature located just below his right scapula and made worse with eating. Occasional nausea no vomiting. Ultrasound shows gallstones. No gallbladder wall thickening. The pain is increasing in frequency and intensity. Sharp in nature lasting minutes to hours before subsiding. HPI  Past Medical History  Diagnosis Date  . Diverticulosis 2004    [facility]; due 2014  . Other and unspecified hyperlipidemia   . CAD (coronary artery disease)     Dr Gala Romney  . Unspecified essential hypertension   . Anemia 2010    H/H  12.9/38  . GERD (gastroesophageal reflux disease)     NO MEDS  . RA (rheumatoid arthritis)     Dr Dierdre Forth  AND OA BOTH KNEES AND HANDS    Past Surgical History  Procedure Laterality Date  . Tonsillectomy    . Angioplasty  1997    3 stents  . Knee surgery      x 2  . Carpal tunnel release      bilateral  . Trigger finger release      multiple  . Back surgery  1995    for ruptured disc LS spine  . Shoulder surgery  2005  . Colonoscopy  2004    Diverticulosis  . Cataract extraction, bilateral  2011  . Total knee arthroplasty  08/07/2011    Procedure: TOTAL KNEE ARTHROPLASTY;  Surgeon: Loanne Drilling, MD;  Location: [facility];  Service: Orthopedics;  Laterality: Right;    Family History  Problem Relation Age of Onset  . Cancer Father     esophagus  . Stroke Mother     in mid 22s  . Arthritis Mother   . Diabetes Brother   . Heart attack Paternal Grandmother     ? in 70s  . Diabetes Maternal Aunt   . Prostate cancer Maternal Uncle   . Cancer Maternal Uncle   . Prostate cancer Cousin   . Cancer Cousin   . Liver cancer       Paternal family history  . Leukemia      Paternal family history  . Cancer Cousin     Social History History  Substance Use Topics  . Smoking status: Former Smoker -- 2.00 packs/day    Quit date: 05/29/1972  . Smokeless tobacco: Never Used     Comment: smoked 1957-1974, up to 2 ppd  . Alcohol Use: Yes     Comment: beer rarely    Allergies  Allergen Reactions  . Sulfonamide Derivatives Rash    Medi Alert bracelet  is recommended  . Atorvastatin Other (See Comments)    Cramping    . Pravastatin Sodium Other (See Comments)    Cramp   . Simvastatin Other (See Comments)    Cramps     Current Outpatient Prescriptions  Medication Sig Dispense Refill  . aspirin 325 MG tablet Take 325 mg by mouth daily.      . budesonide-formoterol (SYMBICORT) 160-4.5 MCG/ACT inhaler Inhale 2 puffs into the lungs 2 (two) times daily.  1 Inhaler  3  . Calcium Carbonate-Vitamin D (CALCIUM 600 + D PO)  Take by mouth daily.      . folic acid (FOLVITE) 1 MG tablet Take 1 mg by mouth daily.      . Ginkgo Biloba 200 MG CAPS Take by mouth daily.      Marland Kitchen glucose blood (FREESTYLE TEST STRIPS) test strip 1 each by Other route as needed. Use as instructed       . HYDROcodone-acetaminophen (VICODIN) 5-500 MG per tablet       . Lancets (FREESTYLE) lancets 1 each by Other route as needed. Use as instructed       . methotrexate 25 MG/ML injection       . metoprolol succinate (TOPROL-XL) 25 MG 24 hr tablet TAKE ONE TABLET BY MOUTH EVERY DAY  90 tablet  1  . Omega-3 Fatty Acids (FISH OIL TRIPLE STRENGTH) 1400 MG CAPS Take by mouth daily.      Marland Kitchen PARoxetine (PAXIL) 20 MG tablet TAKE ONE TABLET BY MOUTH EVERY DAY  30 tablet  5  . Polyethyl Glycol-Propyl Glycol (SYSTANE) 0.4-0.3 % SOLN Apply 1 drop to eye 2 (two) times daily.      . pregabalin (LYRICA) 75 MG capsule Take 1 capsule (75 mg total) by mouth 2 (two) times daily.  28 capsule  0  . rosuvastatin (CRESTOR) 20 MG tablet Take 1 tablet (20 mg total) by mouth  at bedtime.  90 tablet  0  . tamsulosin (FLOMAX) 0.4 MG CAPS Take 1 capsule (0.4 mg total) by mouth daily.  30 capsule  5  . HUMIRA PEN 40 MG/0.8ML injection       . [DISCONTINUED] Calcium Carbonate (CALCIUM 600 PO) Take 600 mg by mouth daily.        No current facility-administered medications for this visit.    Review of Systems Review of Systems  Constitutional: Negative.   HENT: Negative.   Eyes: Negative.   Respiratory: Negative.   Cardiovascular: Negative.   Gastrointestinal: Negative.   Endocrine: Negative.   Genitourinary: Negative.   Musculoskeletal: Positive for back pain and arthralgias. Negative for myalgias and joint swelling.  Skin: Negative.   Allergic/Immunologic: Negative.   Neurological: Negative.   Hematological: Negative for adenopathy. Does not bruise/bleed easily.    Blood pressure 110/66, pulse 60, temperature 97.3 F (36.3 C), temperature source Temporal, resp. rate 14, height 5\' 11"  (1.803 m), weight 204 lb 12.8 oz (92.897 kg).  Physical Exam Physical Exam  Constitutional: He is oriented to person, place, and time. He appears well-developed and well-nourished.  HENT:  Head: Normocephalic and atraumatic.  Eyes: EOM are normal. Pupils are equal, round, and reactive to light.  Neck: Normal range of motion. Neck supple.  Cardiovascular: Regular rhythm.   Pulmonary/Chest: Effort normal and breath sounds normal.  Abdominal: Soft. Bowel sounds are normal. He exhibits no distension and no mass. There is no tenderness. There is no rebound and no guarding.  Musculoskeletal: Normal range of motion.  Neurological: He is alert and oriented to person, place, and time.  Skin: Skin is warm and dry.  Psychiatric: He has a normal mood and affect. His behavior is normal. Judgment and thought content normal.    Data Reviewed Clinical Data: Abdominal pain  COMPLETE ABDOMINAL ULTRASOUND  Comparison: None.  Findings:  Gallbladder: Multiple gallstones. Negative  sonographic Murphy's  sign  Common bile duct: Common bile duct not well visualized. No  dilated ducts are identified.  Liver: Mild increased echogenicity of the liver suggesting fatty  infiltration. No ascites  IVC: Limited  Pancreas: Limited  evaluation  Spleen: 5.5 cm  Right Kidney: 11.1 cm. Negative for obstruction  Left Kidney: 11.5 cm negative for obstruction. 15 mm cyst left  lower pole  Abdominal aorta: No aneurysm identified.  IMPRESSION:  Gallstones  Fatty infiltration of the liver.  Original Report Authenticated By: Janeece Riggers, M.D.    Assessment    Symptomatic cholelithiasis    Plan    Recommend laparoscopic cholecystectomy.The procedure has been discussed with the patient. Operative and non operative treatments have been discussed. Risks of surgery include bleeding, infection,  Common bile duct injury,  Injury to the stomach,liver, colon,small intestine, abdominal wall,  Diaphragm,  Major blood vessels,  And the need for an open procedure.  Other risks include worsening of medical problems, death,  DVT and pulmonary embolism, and cardiovascular events.   Medical options have also been discussed. The patient has been informed of long term expectations of surgery and non surgical options,  The patient agrees to proceed.         Bernie Fobes A. 01/31/2013, 12:02 PM

## 2013-01-31 NOTE — Patient Instructions (Signed)

## 2013-02-02 IMAGING — CR DG CHEST 2V
2 series · 2 of 2 positions shown · non-contrast
Comparison: Chest x-ray of 01/11/2010

CLINICAL DATA: Coronary artery disease, anemia, preop

CHEST - 2 VIEW

[w chest pa]
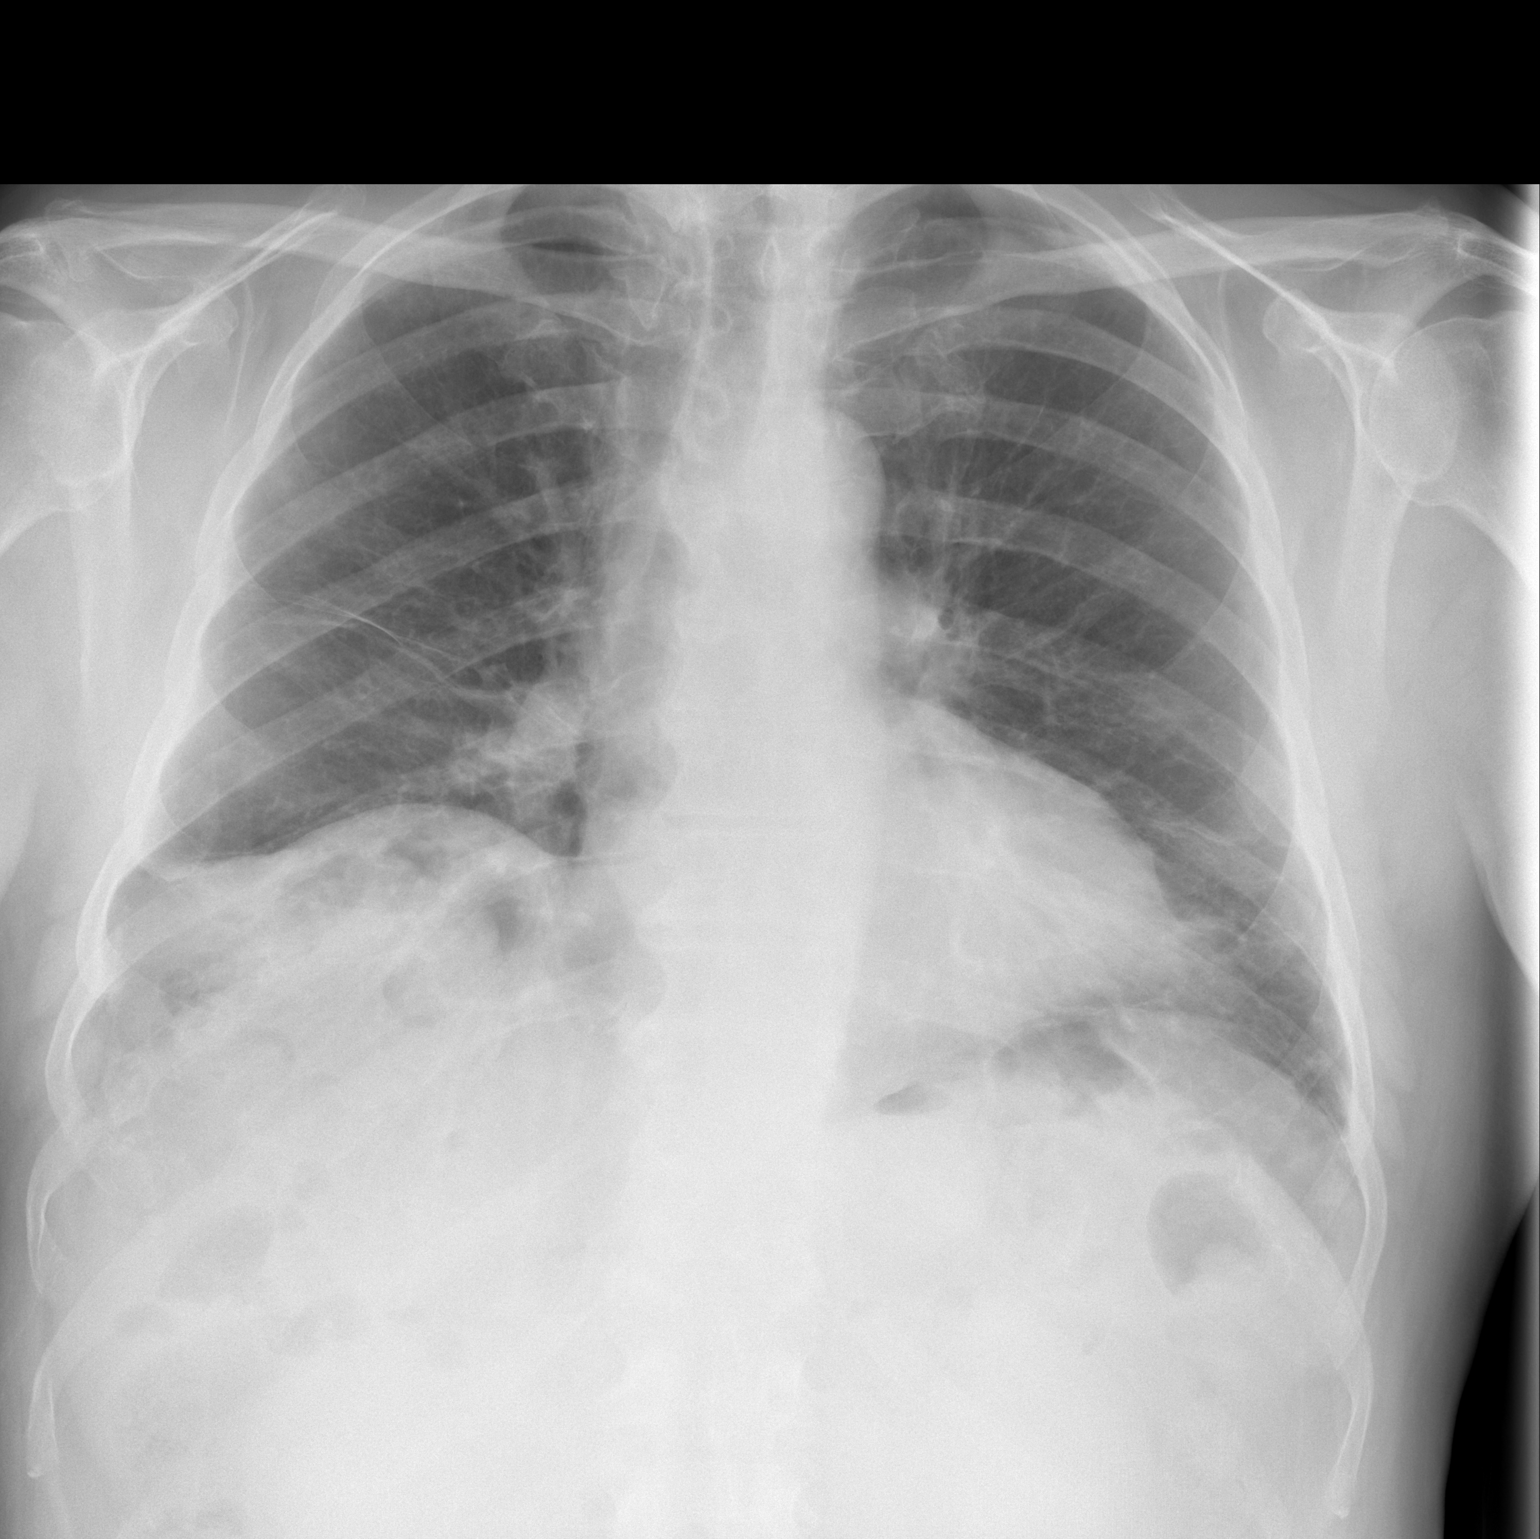

[w chest lat]
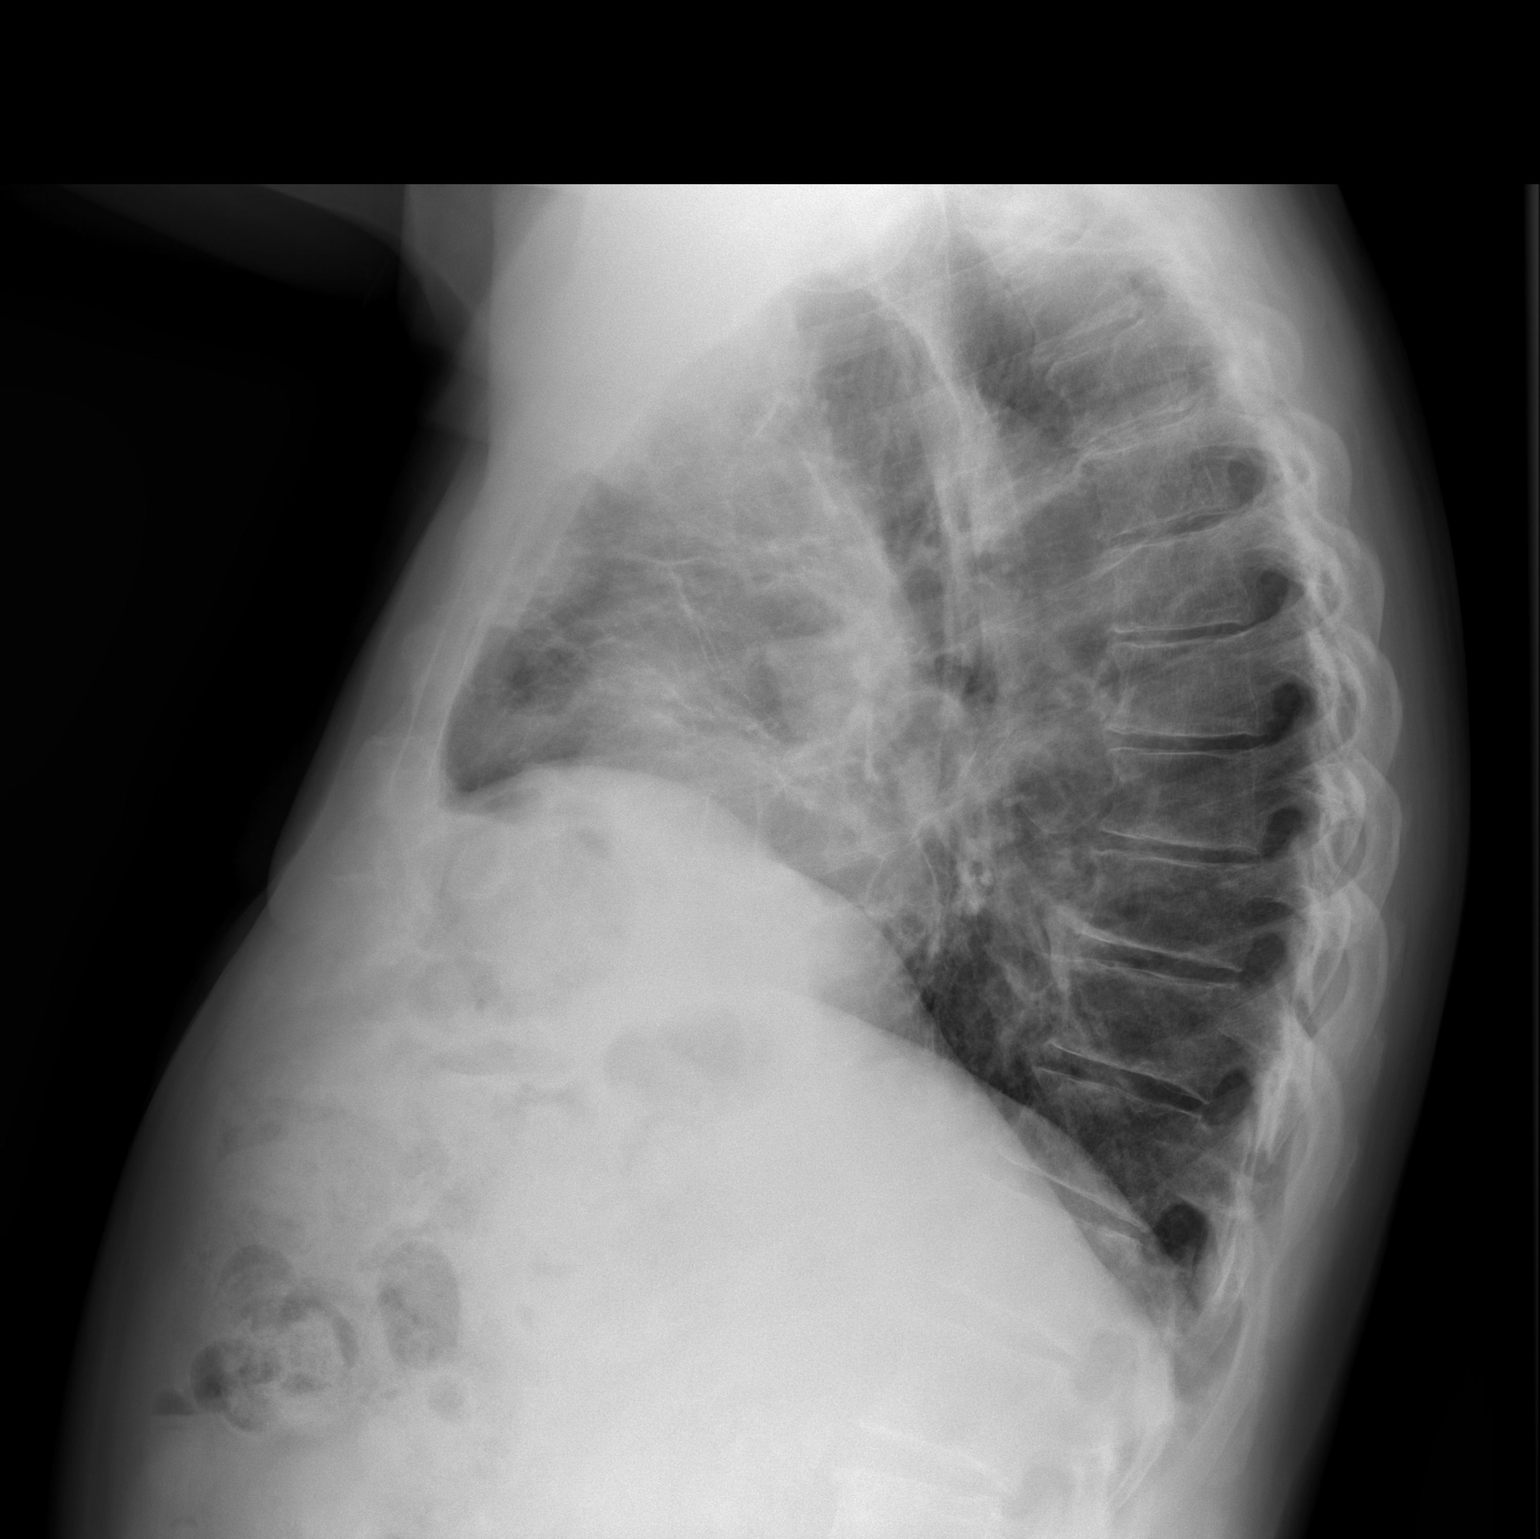

[2 of 2 positions shown; findings below may reference images not displayed]

FINDINGS: Linear scarring primarily in the right mid lung and at
the lung bases is stable.  No active infiltrate or effusion is
seen.  Mild cardiomegaly is stable.  No acute bony abnormality is
seen.
IMPRESSION: Stable chronic change.  No active lung disease.

## 2013-02-04 ENCOUNTER — Encounter (HOSPITAL_COMMUNITY): Payer: Self-pay

## 2013-02-06 ENCOUNTER — Other Ambulatory Visit: Payer: Self-pay | Admitting: Neurology

## 2013-02-11 ENCOUNTER — Encounter (HOSPITAL_COMMUNITY): Payer: Self-pay

## 2013-02-11 ENCOUNTER — Encounter (HOSPITAL_COMMUNITY)
Admission: RE | Admit: 2013-02-11 | Discharge: 2013-02-11 | Disposition: A | Discharge status: Home / Self Care | Payer: Medicare Other | Source: Ambulatory Visit | Attending: Surgery | Admitting: Surgery

## 2013-02-11 LAB — CBC WITH DIFFERENTIAL/PLATELET
Basophils Absolute: 0.1 10*3/uL (ref 0.0–0.1)
HCT: 37.1 % — ABNORMAL LOW (ref 39.0–52.0)
Lymphocytes Relative: 33 % (ref 12–46)
Lymphs Abs: 3 10*3/uL (ref 0.7–4.0)
MCV: 95.1 fL (ref 78.0–100.0)
Monocytes Absolute: 1.3 10*3/uL — ABNORMAL HIGH (ref 0.1–1.0)
Neutro Abs: 4 10*3/uL (ref 1.7–7.7)
RBC: 3.9 MIL/uL — ABNORMAL LOW (ref 4.22–5.81)
RDW: 13.3 % (ref 11.5–15.5)
WBC: 9.1 10*3/uL (ref 4.0–10.5)

## 2013-02-11 LAB — COMPREHENSIVE METABOLIC PANEL
ALT: 21 U/L (ref 0–53)
Albumin: 4.2 g/dL (ref 3.5–5.2)
Alkaline Phosphatase: 36 U/L — ABNORMAL LOW (ref 39–117)
Calcium: 9.9 mg/dL (ref 8.4–10.5)
Potassium: 4.5 mEq/L (ref 3.5–5.1)
Sodium: 134 mEq/L — ABNORMAL LOW (ref 135–145)
Total Protein: 8 g/dL (ref 6.0–8.3)

## 2013-02-11 NOTE — Progress Notes (Signed)
Revonda Standard zelenek pa called for consult

## 2013-02-11 NOTE — Progress Notes (Signed)
Anesthesia PAT Evaluation:  Patient is a 78 year old male scheduled for laparoscopic cholecystectomy on 02/13/13 by Dr. Luisa Hart.  History includes former smoker, CAD s/p multiple stents in 1997, HTN, HLD, GERD, RA, anemia, prior back, shoulder, and knee surgeries, cataract extraction.  He is s/p right TKA on 08/07/11. PCP is Dr. Marga Melnick.  Rheumatologist is Dr. Dierdre Forth.  He is followed at [facility], last visit with Tereso Newcomer, PA-C prior to his TKA last year.  Prior to that his last visit was with Dr. Gala Romney in 2010.  Patient has not been seen by cardiology since January 2013, but denies any chest pain, edema, SOB or significant DOE. His activity is somewhat limited due to left knee pain, but he has been able to walk ~ 1/2 mile in the recent past without CV symptoms.  He has had epigastric and right shoulder blade pains and intermittent nausea for at least a year that has been attributed to his gall bladder disease.  It has progressively worsened over the past few weeks.  This pain is not anything like the exertional chest pain he had when he required stents. Exam shows a pleasant Caucasian male in NAD.  Heart RRR, no significant murmur noted.  Lungs clear.  No LE edema.  Nuclear stress test on 07/06/11 showed: Small area of mild apical ischemia. SDS 3 at apex.  EF 61%.  Nuclear images were reviewed by cardiologist Dr. Eden Emms who felt images were essentially unchanged since 2010.  Ultimately, it was felt to be low risk and he was cleared for his TKA.   EKG on 02/11/13 showed SB @ 52 bpm, LAD, incomplete right BBB, inferior infarct (age undetermined), anterior infarct (age undetermined).  Overall, I think it appears stable when compared to his prior EKG on 06/27/11.   CXR on 02/11/13 showed: Chronic basilar scarring changes but no acute overlying pulmonary process.    Preoperative labs noted.  Patient had a low risk stress test within the past two years and has since tolerated TKA.  He  denies any new CV symptoms.  Chart reviewed with anesthesiologist Dr. Noreene Larsson who agrees that if no significant changes then anticipate that he can proceed as planned.  Velna Ochs [facility] Stay [facility]/Anesthesiology Phone [phone] 02/11/2013 5:56 PM

## 2013-02-11 NOTE — Pre-Procedure Instructions (Addendum)
Ethan Robinson  02/11/2013   Your procedure is scheduled on:  02/13/13  Report to [facility] [id number] AM.  Call this number if you have problems the morning of surgery: [phone]   Remember:   Do not eat food or drink liquids after midnight.   Take these medicines the morning of surgery with A SIP OF WATER: inhaler as needed, pain med as needed, metoprolol,paxil        STOP fish oil, coq10,aspirin, ginko biloba, any nsaids, blood thinner, herbal meds now   Do not wear jewelry, make-up or nail polish.  Do not wear lotions, powders, or perfumes. You may wear deodorant.  Do not shave 48 hours prior to surgery. Men may shave face and neck.  Do not bring valuables to the hospital.  [facility] is not responsible                   for any belongings or valuables.  Contacts, dentures or bridgework may not be worn into surgery.  Leave suitcase in the car. After surgery it may be brought to your room.  For patients admitted to the hospital, checkout time is 11:00 AM the day of  discharge.   Patients discharged the day of surgery will not be allowed to drive  home.  Name and phone number of your driver:   Special Instructions: Shower using CHG 2 nights before surgery and the night before surgery.  If you shower the day of surgery use CHG.  Use special wash - you have one bottle of CHG for all showers.  You should use approximately 1/3 of the bottle for each shower.   Please read over the following fact sheets that you were given: Pain Booklet, Coughing and Deep Breathing and Surgical Site Infection Prevention

## 2013-02-12 MED ORDER — DEXTROSE 5 % IV SOLN
3.0000 g | INTRAVENOUS | Status: AC
  Administered 2013-02-13: 3 g via INTRAVENOUS
  Filled 2013-02-12: qty 3000

## 2013-02-13 ENCOUNTER — Ambulatory Visit (HOSPITAL_COMMUNITY)
Admission: RE | Admit: 2013-02-13 | Discharge: 2013-02-13 | Disposition: A | Discharge status: Home / Self Care | Payer: Medicare Other | Source: Ambulatory Visit | Attending: Surgery | Admitting: Surgery

## 2013-02-13 ENCOUNTER — Encounter (HOSPITAL_COMMUNITY): Payer: Self-pay | Admitting: *Deleted

## 2013-02-13 ENCOUNTER — Ambulatory Visit (HOSPITAL_COMMUNITY): Payer: Medicare Other

## 2013-02-13 ENCOUNTER — Encounter (HOSPITAL_COMMUNITY)
Admission: RE | Disposition: A | Discharge status: Home / Self Care | Payer: Self-pay | Source: Ambulatory Visit | Attending: Surgery

## 2013-02-13 ENCOUNTER — Ambulatory Visit (HOSPITAL_COMMUNITY): Payer: Medicare Other | Admitting: Anesthesiology

## 2013-02-13 ENCOUNTER — Encounter (HOSPITAL_COMMUNITY): Payer: Self-pay | Admitting: Vascular Surgery

## 2013-02-13 DIAGNOSIS — I10 Essential (primary) hypertension: Secondary | ICD-10-CM | POA: Insufficient documentation

## 2013-02-13 DIAGNOSIS — Z01810 Encounter for preprocedural cardiovascular examination: Secondary | ICD-10-CM | POA: Insufficient documentation

## 2013-02-13 DIAGNOSIS — Z01812 Encounter for preprocedural laboratory examination: Secondary | ICD-10-CM | POA: Insufficient documentation

## 2013-02-13 DIAGNOSIS — Z01818 Encounter for other preprocedural examination: Secondary | ICD-10-CM | POA: Insufficient documentation

## 2013-02-13 DIAGNOSIS — K8010 Calculus of gallbladder with chronic cholecystitis without obstruction: Secondary | ICD-10-CM

## 2013-02-13 HISTORY — PX: CHOLECYSTECTOMY: SHX55

## 2013-02-13 LAB — GLUCOSE, CAPILLARY: Glucose-Capillary: 105 mg/dL — ABNORMAL HIGH (ref 70–99)

## 2013-02-13 SURGERY — LAPAROSCOPIC CHOLECYSTECTOMY WITH INTRAOPERATIVE CHOLANGIOGRAM
Anesthesia: General | Site: Abdomen | Wound class: Contaminated

## 2013-02-13 MED ORDER — EPHEDRINE SULFATE 50 MG/ML IJ SOLN
INTRAMUSCULAR | Status: DC | PRN
  Administered 2013-02-13 (×2): 10 mg via INTRAVENOUS

## 2013-02-13 MED ORDER — KETOROLAC TROMETHAMINE 30 MG/ML IJ SOLN
INTRAMUSCULAR | Status: AC
  Filled 2013-02-13: qty 1

## 2013-02-13 MED ORDER — HYDROMORPHONE HCL PF 1 MG/ML IJ SOLN
INTRAMUSCULAR | Status: AC
  Filled 2013-02-13: qty 1

## 2013-02-13 MED ORDER — SODIUM CHLORIDE 0.9 % IV SOLN
INTRAVENOUS | Status: DC | PRN
  Administered 2013-02-13: 14:00:00

## 2013-02-13 MED ORDER — NEOSTIGMINE METHYLSULFATE 1 MG/ML IJ SOLN
INTRAMUSCULAR | Status: DC | PRN
  Administered 2013-02-13: 5 mg via INTRAVENOUS

## 2013-02-13 MED ORDER — BUPIVACAINE-EPINEPHRINE PF 0.25-1:200000 % IJ SOLN
INTRAMUSCULAR | Status: AC
  Filled 2013-02-13: qty 30

## 2013-02-13 MED ORDER — OXYCODONE-ACETAMINOPHEN 5-325 MG PO TABS: 1.0000 | ORAL_TABLET | ORAL | Status: DC | PRN

## 2013-02-13 MED ORDER — KETOROLAC TROMETHAMINE 15 MG/ML IJ SOLN
15.0000 mg | Freq: Four times a day (QID) | INTRAMUSCULAR | Status: DC
  Administered 2013-02-13: 15 mg via INTRAVENOUS

## 2013-02-13 MED ORDER — HEMOSTATIC AGENTS (NO CHARGE) OPTIME
TOPICAL | Status: DC | PRN
  Administered 2013-02-13: 1 via TOPICAL

## 2013-02-13 MED ORDER — LACTATED RINGERS IV SOLN
INTRAVENOUS | Status: DC | PRN
  Administered 2013-02-13 (×2): via INTRAVENOUS

## 2013-02-13 MED ORDER — ONDANSETRON HCL 4 MG/2ML IJ SOLN: 4.0000 mg | Freq: Once | INTRAMUSCULAR | Status: DC | PRN

## 2013-02-13 MED ORDER — OXYCODONE HCL 5 MG PO TABS
ORAL_TABLET | ORAL | Status: AC
  Filled 2013-02-13: qty 1

## 2013-02-13 MED ORDER — SODIUM CHLORIDE 0.9 % IR SOLN
Status: DC | PRN
  Administered 2013-02-13: 2000 mL

## 2013-02-13 MED ORDER — OXYCODONE-ACETAMINOPHEN 5-325 MG PO TABS: 2.0000 | ORAL_TABLET | ORAL | Status: DC | PRN

## 2013-02-13 MED ORDER — CHLORHEXIDINE GLUCONATE 4 % EX LIQD: 1.0000 "application " | Freq: Once | CUTANEOUS | Status: DC

## 2013-02-13 MED ORDER — HYDROMORPHONE HCL PF 1 MG/ML IJ SOLN
0.2500 mg | INTRAMUSCULAR | Status: DC | PRN
  Administered 2013-02-13 (×4): 0.5 mg via INTRAVENOUS

## 2013-02-13 MED ORDER — OXYCODONE HCL 5 MG PO TABS
5.0000 mg | ORAL_TABLET | ORAL | Status: DC | PRN
  Administered 2013-02-13: 5 mg via ORAL

## 2013-02-13 MED ORDER — BUPIVACAINE-EPINEPHRINE 0.25% -1:200000 IJ SOLN
INTRAMUSCULAR | Status: DC | PRN
  Administered 2013-02-13: 7 mL

## 2013-02-13 MED ORDER — PROPOFOL 10 MG/ML IV BOLUS
INTRAVENOUS | Status: DC | PRN
  Administered 2013-02-13: 200 mg via INTRAVENOUS

## 2013-02-13 MED ORDER — FENTANYL CITRATE 0.05 MG/ML IJ SOLN
INTRAMUSCULAR | Status: DC | PRN
  Administered 2013-02-13 (×2): 100 ug via INTRAVENOUS
  Administered 2013-02-13: 50 ug via INTRAVENOUS

## 2013-02-13 MED ORDER — LIDOCAINE HCL (CARDIAC) 20 MG/ML IV SOLN
INTRAVENOUS | Status: DC | PRN
  Administered 2013-02-13: 100 mg via INTRAVENOUS

## 2013-02-13 MED ORDER — ROCURONIUM BROMIDE 100 MG/10ML IV SOLN
INTRAVENOUS | Status: DC | PRN
  Administered 2013-02-13: 40 mg via INTRAVENOUS

## 2013-02-13 MED ORDER — 0.9 % SODIUM CHLORIDE (POUR BTL) OPTIME
TOPICAL | Status: DC | PRN
  Administered 2013-02-13: 2000 mL

## 2013-02-13 MED ORDER — ARTIFICIAL TEARS OP OINT
TOPICAL_OINTMENT | OPHTHALMIC | Status: DC | PRN
  Administered 2013-02-13: 1 via OPHTHALMIC

## 2013-02-13 MED ORDER — GLYCOPYRROLATE 0.2 MG/ML IJ SOLN
INTRAMUSCULAR | Status: DC | PRN
  Administered 2013-02-13: .8 mg via INTRAVENOUS
  Administered 2013-02-13: .2 mg via INTRAVENOUS

## 2013-02-13 MED ORDER — ONDANSETRON HCL 4 MG/2ML IJ SOLN
INTRAMUSCULAR | Status: DC | PRN
  Administered 2013-02-13: 4 mg via INTRAVENOUS

## 2013-02-13 SURGICAL SUPPLY — 41 items
ADH SKN CLS APL DERMABOND .7 (GAUZE/BANDAGES/DRESSINGS) ×1
APPLIER CLIP ROT 10 11.4 M/L (STAPLE) ×2
APR CLP MED LRG 11.4X10 (STAPLE) ×1
BLADE SURG ROTATE 9660 (MISCELLANEOUS) ×2 IMPLANT
CANISTER SUCTION 2500CC (MISCELLANEOUS) ×2 IMPLANT
CHLORAPREP W/TINT 26ML (MISCELLANEOUS) ×2 IMPLANT
CLIP APPLIE ROT 10 11.4 M/L (STAPLE) ×1 IMPLANT
CLOTH BEACON ORANGE TIMEOUT ST (SAFETY) ×2 IMPLANT
COVER MAYO STAND STRL (DRAPES) ×2 IMPLANT
COVER SURGICAL LIGHT HANDLE (MISCELLANEOUS) ×2 IMPLANT
DECANTER SPIKE VIAL GLASS SM (MISCELLANEOUS) ×4 IMPLANT
DERMABOND ADVANCED (GAUZE/BANDAGES/DRESSINGS) ×1
DERMABOND ADVANCED .7 DNX12 (GAUZE/BANDAGES/DRESSINGS) ×1 IMPLANT
DRAPE C-ARM 42X72 X-RAY (DRAPES) ×2 IMPLANT
DRAPE UTILITY 15X26 W/TAPE STR (DRAPE) ×4 IMPLANT
DRAPE WARM FLUID 44X44 (DRAPE) ×2 IMPLANT
ELECT REM PT RETURN 9FT ADLT (ELECTROSURGICAL) ×2
ELECTRODE REM PT RTRN 9FT ADLT (ELECTROSURGICAL) ×1 IMPLANT
GLOVE BIO SURGEON STRL SZ8 (GLOVE) ×2 IMPLANT
GLOVE BIOGEL PI IND STRL 8 (GLOVE) ×1 IMPLANT
GLOVE BIOGEL PI INDICATOR 8 (GLOVE) ×1
GOWN STRL NON-REIN LRG LVL3 (GOWN DISPOSABLE) ×8 IMPLANT
GOWN STRL REIN XL XLG (GOWN DISPOSABLE) ×2 IMPLANT
HEMOSTAT SNOW SURGICEL 2X4 (HEMOSTASIS) ×2 IMPLANT
KIT BASIN OR (CUSTOM PROCEDURE TRAY) ×2 IMPLANT
KIT ROOM TURNOVER OR (KITS) ×2 IMPLANT
NS IRRIG 1000ML POUR BTL (IV SOLUTION) ×4 IMPLANT
PAD ARMBOARD 7.5X6 YLW CONV (MISCELLANEOUS) ×2 IMPLANT
POUCH SPECIMEN RETRIEVAL 10MM (ENDOMECHANICALS) ×2 IMPLANT
SCISSORS LAP 5X35 DISP (ENDOMECHANICALS) IMPLANT
SET CHOLANGIOGRAPH 5 50 .035 (SET/KITS/TRAYS/PACK) ×2 IMPLANT
SET IRRIG TUBING LAPAROSCOPIC (IRRIGATION / IRRIGATOR) ×2 IMPLANT
SLEEVE ENDOPATH XCEL 5M (ENDOMECHANICALS) ×2 IMPLANT
SPECIMEN JAR SMALL (MISCELLANEOUS) ×2 IMPLANT
SUT MNCRL AB 4-0 PS2 18 (SUTURE) ×2 IMPLANT
TOWEL OR 17X24 6PK STRL BLUE (TOWEL DISPOSABLE) ×2 IMPLANT
TOWEL OR 17X26 10 PK STRL BLUE (TOWEL DISPOSABLE) ×2 IMPLANT
TRAY LAPAROSCOPIC (CUSTOM PROCEDURE TRAY) ×2 IMPLANT
TROCAR XCEL BLUNT TIP 100MML (ENDOMECHANICALS) ×2 IMPLANT
TROCAR XCEL NON-BLD 11X100MML (ENDOMECHANICALS) ×2 IMPLANT
TROCAR XCEL NON-BLD 5MMX100MML (ENDOMECHANICALS) ×2 IMPLANT

## 2013-02-13 NOTE — H&P (View-Only) (Signed)
Patient ID: Ethan Robinson, male   DOB: 09/30/1934, 78 y.o.   MRN: [id number]  Chief Complaint  Patient presents with  . New Evaluation    eval gallstones    HPI Ethan Robinson is a 78 y.o. male.  Patient sent at request of Dr. Hopper for right upper quadrant and right upper back pain. Patient has history of gallstones. Pain is sharp in nature located just below his right scapula and made worse with eating. Occasional nausea no vomiting. Ultrasound shows gallstones. No gallbladder wall thickening. The pain is increasing in frequency and intensity. Sharp in nature lasting minutes to hours before subsiding. HPI  Past Medical History  Diagnosis Date  . Diverticulosis 2004    [facility]; due 2014  . Other and unspecified hyperlipidemia   . CAD (coronary artery disease)     Dr Bensimhon  . Unspecified essential hypertension   . Anemia 2010    H/H  12.9/38  . GERD (gastroesophageal reflux disease)     NO MEDS  . RA (rheumatoid arthritis)     Dr Beekman  AND OA BOTH KNEES AND HANDS    Past Surgical History  Procedure Laterality Date  . Tonsillectomy    . Angioplasty  1997    3 stents  . Knee surgery      x 2  . Carpal tunnel release      bilateral  . Trigger finger release      multiple  . Back surgery  1995    for ruptured disc LS spine  . Shoulder surgery  2005  . Colonoscopy  2004    Diverticulosis  . Cataract extraction, bilateral  2011  . Total knee arthroplasty  08/07/2011    Procedure: TOTAL KNEE ARTHROPLASTY;  Surgeon: Frank V Aluisio, MD;  Location: [facility];  Service: Orthopedics;  Laterality: Right;    Family History  Problem Relation Age of Onset  . Cancer Father     esophagus  . Stroke Mother     in mid 70s  . Arthritis Mother   . Diabetes Brother   . Heart attack Paternal Grandmother     ? in 60s  . Diabetes Maternal Aunt   . Prostate cancer Maternal Uncle   . Cancer Maternal Uncle   . Prostate cancer Cousin   . Cancer Cousin   . Liver cancer       Paternal family history  . Leukemia      Paternal family history  . Cancer Cousin     Social History History  Substance Use Topics  . Smoking status: Former Smoker -- 2.00 packs/day    Quit date: 05/29/1972  . Smokeless tobacco: Never Used     Comment: smoked 1957-1974, up to 2 ppd  . Alcohol Use: Yes     Comment: beer rarely    Allergies  Allergen Reactions  . Sulfonamide Derivatives Rash    Medi Alert bracelet  is recommended  . Atorvastatin Other (See Comments)    Cramping    . Pravastatin Sodium Other (See Comments)    Cramp   . Simvastatin Other (See Comments)    Cramps     Current Outpatient Prescriptions  Medication Sig Dispense Refill  . aspirin 325 MG tablet Take 325 mg by mouth daily.      . budesonide-formoterol (SYMBICORT) 160-4.5 MCG/ACT inhaler Inhale 2 puffs into the lungs 2 (two) times daily.  1 Inhaler  3  . Calcium Carbonate-Vitamin D (CALCIUM 600 + D PO)   Take by mouth daily.      . folic acid (FOLVITE) 1 MG tablet Take 1 mg by mouth daily.      . Ginkgo Biloba 200 MG CAPS Take by mouth daily.      . glucose blood (FREESTYLE TEST STRIPS) test strip 1 each by Other route as needed. Use as instructed       . HYDROcodone-acetaminophen (VICODIN) 5-500 MG per tablet       . Lancets (FREESTYLE) lancets 1 each by Other route as needed. Use as instructed       . methotrexate 25 MG/ML injection       . metoprolol succinate (TOPROL-XL) 25 MG 24 hr tablet TAKE ONE TABLET BY MOUTH EVERY DAY  90 tablet  1  . Omega-3 Fatty Acids (FISH OIL TRIPLE STRENGTH) 1400 MG CAPS Take by mouth daily.      . PARoxetine (PAXIL) 20 MG tablet TAKE ONE TABLET BY MOUTH EVERY DAY  30 tablet  5  . Polyethyl Glycol-Propyl Glycol (SYSTANE) 0.4-0.3 % SOLN Apply 1 drop to eye 2 (two) times daily.      . pregabalin (LYRICA) 75 MG capsule Take 1 capsule (75 mg total) by mouth 2 (two) times daily.  28 capsule  0  . rosuvastatin (CRESTOR) 20 MG tablet Take 1 tablet (20 mg total) by mouth  at bedtime.  90 tablet  0  . tamsulosin (FLOMAX) 0.4 MG CAPS Take 1 capsule (0.4 mg total) by mouth daily.  30 capsule  5  . HUMIRA PEN 40 MG/0.8ML injection       . [DISCONTINUED] Calcium Carbonate (CALCIUM 600 PO) Take 600 mg by mouth daily.        No current facility-administered medications for this visit.    Review of Systems Review of Systems  Constitutional: Negative.   HENT: Negative.   Eyes: Negative.   Respiratory: Negative.   Cardiovascular: Negative.   Gastrointestinal: Negative.   Endocrine: Negative.   Genitourinary: Negative.   Musculoskeletal: Positive for back pain and arthralgias. Negative for myalgias and joint swelling.  Skin: Negative.   Allergic/Immunologic: Negative.   Neurological: Negative.   Hematological: Negative for adenopathy. Does not bruise/bleed easily.    Blood pressure 110/66, pulse 60, temperature 97.3 F (36.3 C), temperature source Temporal, resp. rate 14, height 5' 11" (1.803 m), weight 204 lb 12.8 oz (92.897 kg).  Physical Exam Physical Exam  Constitutional: He is oriented to person, place, and time. He appears well-developed and well-nourished.  HENT:  Head: Normocephalic and atraumatic.  Eyes: EOM are normal. Pupils are equal, round, and reactive to light.  Neck: Normal range of motion. Neck supple.  Cardiovascular: Regular rhythm.   Pulmonary/Chest: Effort normal and breath sounds normal.  Abdominal: Soft. Bowel sounds are normal. He exhibits no distension and no mass. There is no tenderness. There is no rebound and no guarding.  Musculoskeletal: Normal range of motion.  Neurological: He is alert and oriented to person, place, and time.  Skin: Skin is warm and dry.  Psychiatric: He has a normal mood and affect. His behavior is normal. Judgment and thought content normal.    Data Reviewed Clinical Data: Abdominal pain  COMPLETE ABDOMINAL ULTRASOUND  Comparison: None.  Findings:  Gallbladder: Multiple gallstones. Negative  sonographic Murphy's  sign  Common bile duct: Common bile duct not well visualized. No  dilated ducts are identified.  Liver: Mild increased echogenicity of the liver suggesting fatty  infiltration. No ascites  IVC: Limited  Pancreas: Limited   evaluation  Spleen: 5.5 cm  Right Kidney: 11.1 cm. Negative for obstruction  Left Kidney: 11.5 cm negative for obstruction. 15 mm cyst left  lower pole  Abdominal aorta: No aneurysm identified.  IMPRESSION:  Gallstones  Fatty infiltration of the liver.  Original Report Authenticated By: David Clark, M.D.    Assessment    Symptomatic cholelithiasis    Plan    Recommend laparoscopic cholecystectomy.The procedure has been discussed with the patient. Operative and non operative treatments have been discussed. Risks of surgery include bleeding, infection,  Common bile duct injury,  Injury to the stomach,liver, colon,small intestine, abdominal wall,  Diaphragm,  Major blood vessels,  And the need for an open procedure.  Other risks include worsening of medical problems, death,  DVT and pulmonary embolism, and cardiovascular events.   Medical options have also been discussed. The patient has been informed of long term expectations of surgery and non surgical options,  The patient agrees to proceed.         Mikhaila Roh A. 01/31/2013, 12:02 PM    

## 2013-02-13 NOTE — Anesthesia Preprocedure Evaluation (Signed)
Anesthesia Evaluation  Patient identified by MRN, date of birth, ID band Patient awake    Reviewed: Allergy & Precautions, H&P , NPO status , Patient's Chart, lab work & pertinent test results  Airway Mallampati: I TM Distance: >3 FB Neck ROM: full    Dental   Pulmonary former smoker,          Cardiovascular hypertension, + CAD Rhythm:regular Rate:Normal     Neuro/Psych  Neuromuscular disease    GI/Hepatic PUD, GERD-  ,  Endo/Other    Renal/GU      Musculoskeletal  (+) Arthritis -, Rheumatoid disorders,    Abdominal   Peds  Hematology   Anesthesia Other Findings   Reproductive/Obstetrics                           Anesthesia Physical Anesthesia Plan  ASA: III  Anesthesia Plan: General   Post-op Pain Management:    Induction: Intravenous  Airway Management Planned: Oral ETT  Additional Equipment:   Intra-op Plan:   Post-operative Plan: Extubation in OR  Informed Consent: I have reviewed the patients History and Physical, chart, labs and discussed the procedure including the risks, benefits and alternatives for the proposed anesthesia with the patient or authorized representative who has indicated his/her understanding and acceptance.     Plan Discussed with: CRNA, Anesthesiologist and Surgeon  Anesthesia Plan Comments:         Anesthesia Quick Evaluation

## 2013-02-13 NOTE — Anesthesia Postprocedure Evaluation (Signed)
  Anesthesia Post-op Note  Patient: Ethan Robinson  Procedure(s) Performed: Procedure(s): LAPAROSCOPIC CHOLECYSTECTOMY WITH INTRAOPERATIVE CHOLANGIOGRAM (N/A)  Patient Location: PACU  Anesthesia Type:General  Level of Consciousness: awake, oriented, sedated and patient cooperative  Airway and Oxygen Therapy: Patient Spontanous Breathing  Post-op Pain: mild  Post-op Assessment: Post-op Vital signs reviewed, Patient's Cardiovascular Status Stable, Respiratory Function Stable, Patent Airway, No signs of Nausea or vomiting and Pain level controlled  Post-op Vital Signs: stable  Complications: No apparent anesthesia complications

## 2013-02-13 NOTE — Interval H&P Note (Signed)
History and Physical Interval Note:  02/13/2013 1:04 PM  Ethan Robinson  has presented today for surgery, with the diagnosis of gallstones  The various methods of treatment have been discussed with the patient and family. After consideration of risks, benefits and other options for treatment, the patient has consented to  Procedure(s): LAPAROSCOPIC CHOLECYSTECTOMY WITH INTRAOPERATIVE CHOLANGIOGRAM (N/A) as a surgical intervention .  The patient's history has been reviewed, patient examined, no change in status, stable for surgery.  I have reviewed the patient's chart and labs.  Questions were answered to the patient's satisfaction.     Jadae Steinke A.

## 2013-02-13 NOTE — Preoperative (Signed)
Beta Blockers   Reason not to administer Beta Blockers:Toprol XL

## 2013-02-13 NOTE — Anesthesia Procedure Notes (Signed)
Procedure Name: Intubation Date/Time: 02/13/2013 1:27 PM Performed by: Sherie Don Pre-anesthesia Checklist: Patient identified, Emergency Drugs available, Suction available, Patient being monitored and Timeout performed Patient Re-evaluated:Patient Re-evaluated prior to inductionOxygen Delivery Method: Circle system utilized Preoxygenation: Pre-oxygenation with 100% oxygen Intubation Type: IV induction Laryngoscope Size: Mac and 4 Grade View: Grade II Tube type: Oral Tube size: 7.5 mm Number of attempts: 1 Airway Equipment and Method: Stylet and LTA kit utilized Placement Confirmation: ETT inserted through vocal cords under direct vision and breath sounds checked- equal and bilateral Secured at: 23 cm Tube secured with: Tape Dental Injury: Teeth and Oropharynx as per pre-operative assessment

## 2013-02-13 NOTE — Op Note (Addendum)
Laparoscopic Cholecystectomy with IOC Procedure Note  Indications: This patient presents with symptomatic gallbladder disease and will undergo laparoscopic cholecystectomy.The procedure has been discussed with the patient. Operative and non operative treatments have been discussed. Risks of surgery include bleeding, infection,  Common bile duct injury,  Injury to the stomach,liver, colon,small intestine, abdominal wall,  Diaphragm,  Major blood vessels,  And the need for an open procedure.  Other risks include worsening of medical problems, death,  DVT and pulmonary embolism, and cardiovascular events.   Medical options have also been discussed. The patient has been informed of long term expectations of surgery and non surgical options,  The patient agrees to proceed.    Pre-operative Diagnosis: Chronic cholecystitis  Post-operative Diagnosis: Same  Surgeon: Venora Kautzman A.   Assistants: OR   Anesthesia: General endotracheal anesthesia and Local anesthesia 0.25.% bupivacaine, with epinephrine  ASA Class: 3  Procedure Details  The patient was seen again in the Holding Room. The risks, benefits, complications, treatment options, and expected outcomes were discussed with the patient. The possibilities of reaction to medication, pulmonary aspiration, perforation of viscus, bleeding, recurrent infection, finding a normal gallbladder, the need for additional procedures, failure to diagnose a condition, the possible need to convert to an open procedure, and creating a complication requiring transfusion or operation were discussed with the patient. The patient and/or family concurred with the proposed plan, giving informed consent. The site of surgery properly noted/marked. The patient was taken to Operating Room, identified as Ethan Robinson and the procedure verified as Laparoscopic Cholecystectomy with Intraoperative Cholangiograms. A Time Out was held and the above information confirmed.  Prior  to the induction of general anesthesia, antibiotic prophylaxis was administered. General endotracheal anesthesia was then administered and tolerated well. After the induction, the abdomen was prepped in the usual sterile fashion. The patient was positioned in the supine position with the left arm comfortably tucked, along with some reverse Trendelenburg.  Local anesthetic agent was injected into the skin near the umbilicus and an incision made. The midline fascia was incised and the Hasson technique was used to introduce a 12 mm port under direct vision. It was secured with a figure of eight Vicryl suture placed in the usual fashion. Pneumoperitoneum was then created with CO2 and tolerated well without any adverse changes in the patient's vital signs. Additional trocars were introduced under direct vision with an 11 mm trocar in the epigastrium and 2 5 mm trocars in the right upper quadrant. All skin incisions were infiltrated with a local anesthetic agent before making the incision and placing the trocars.   The gallbladder was identified, the fundus grasped and retracted cephalad. Adhesions were lysed bluntly and with the electrocautery where indicated, taking care not to injure any adjacent organs or viscus. The infundibulum was grasped and retracted laterally, exposing the peritoneum overlying the triangle of Calot. This was then divided and exposed in a blunt fashion. The cystic duct was clearly identified and bluntly dissected circumferentially. The junctions of the gallbladder, cystic duct and common bile duct were clearly identified prior to the division of any linear structure.   An incision was made in the cystic duct and the cholangiogram catheter introduced. The catheter was secured using an endoclip. The study showed some filling defects in the bile duct that behaved like air bubbles and less so calculi. There was   good visualization of the distal and proximal biliary tree. The catheter was then  removed.  No dilation noted.  No obstruction.  I would follow patient clinically and pursue no other testing.   The cystic duct was then  ligated with surgical clips  on the patient side and  clipped on the gallbladder side and divided. The cystic artery was identified, dissected free, ligated with clips and divided as well. Posterior cystic artery clipped and divided.  The gallbladder was dissected from the liver bed in retrograde fashion with the electrocautery. The gallbladder was removed with Endocatch bag.. The liver bed was irrigated and inspected. Hemostasis was achieved with the electrocautery. Copious irrigation was utilized and was repeatedly aspirated until clear all particulate matter. Hemostasis was achieved with no signs  Of bleeding or bile leakage. Surgicel placed.  Pneumoperitoneum was completely reduced after viewing removal of the trocars under direct vision. The wound was thoroughly irrigated and the fascia was then closed with a figure of eight suture; the skin was then closed with 4 O  and a sterile dressing was applied.  Instrument, sponge, and needle counts were correct at closure and at the conclusion of the case.   Findings: Cholecystitis with Cholelithiasis  Estimated Blood Loss: less than 100 mL          Total IV Fluids: 1200 mL         Specimens: Gallbladder           Complications: None; patient tolerated the procedure well.         Disposition: PACU - hemodynamically stable.         Condition: stable

## 2013-02-13 NOTE — Transfer of Care (Signed)
Immediate Anesthesia Transfer of Care Note  Patient: Ethan Robinson  Procedure(s) Performed: Procedure(s): LAPAROSCOPIC CHOLECYSTECTOMY WITH INTRAOPERATIVE CHOLANGIOGRAM (N/A)  Patient Location: PACU  Anesthesia Type:General  Level of Consciousness: awake and alert   Airway & Oxygen Therapy: Patient Spontanous Breathing and Patient connected to nasal cannula oxygen  Post-op Assessment: Report given to PACU RN and Post -op Vital signs reviewed and stable  Post vital signs: Reviewed and stable  Complications: No apparent anesthesia complications

## 2013-02-16 ENCOUNTER — Encounter (HOSPITAL_COMMUNITY): Payer: Self-pay | Admitting: Surgery

## 2013-02-20 ENCOUNTER — Emergency Department (HOSPITAL_COMMUNITY)
Admission: EM | Admit: 2013-02-20 | Discharge: 2013-02-20 | Disposition: A | Discharge status: Home / Self Care | Payer: Medicare Other | Attending: Emergency Medicine | Admitting: Emergency Medicine

## 2013-02-20 ENCOUNTER — Telehealth: Payer: Self-pay | Admitting: Internal Medicine

## 2013-02-20 ENCOUNTER — Encounter (HOSPITAL_COMMUNITY): Payer: Self-pay

## 2013-02-20 ENCOUNTER — Telehealth (INDEPENDENT_AMBULATORY_CARE_PROVIDER_SITE_OTHER): Payer: Self-pay | Admitting: *Deleted

## 2013-02-20 DIAGNOSIS — Z79899 Other long term (current) drug therapy: Secondary | ICD-10-CM | POA: Insufficient documentation

## 2013-02-20 DIAGNOSIS — M069 Rheumatoid arthritis, unspecified: Secondary | ICD-10-CM | POA: Insufficient documentation

## 2013-02-20 DIAGNOSIS — Z8719 Personal history of other diseases of the digestive system: Secondary | ICD-10-CM | POA: Insufficient documentation

## 2013-02-20 DIAGNOSIS — M549 Dorsalgia, unspecified: Secondary | ICD-10-CM | POA: Insufficient documentation

## 2013-02-20 DIAGNOSIS — Z9889 Other specified postprocedural states: Secondary | ICD-10-CM | POA: Insufficient documentation

## 2013-02-20 DIAGNOSIS — E785 Hyperlipidemia, unspecified: Secondary | ICD-10-CM | POA: Insufficient documentation

## 2013-02-20 DIAGNOSIS — I10 Essential (primary) hypertension: Secondary | ICD-10-CM | POA: Insufficient documentation

## 2013-02-20 DIAGNOSIS — Z87891 Personal history of nicotine dependence: Secondary | ICD-10-CM | POA: Insufficient documentation

## 2013-02-20 DIAGNOSIS — Z9089 Acquired absence of other organs: Secondary | ICD-10-CM | POA: Insufficient documentation

## 2013-02-20 DIAGNOSIS — R1013 Epigastric pain: Secondary | ICD-10-CM

## 2013-02-20 DIAGNOSIS — Z862 Personal history of diseases of the blood and blood-forming organs and certain disorders involving the immune mechanism: Secondary | ICD-10-CM | POA: Insufficient documentation

## 2013-02-20 DIAGNOSIS — I2510 Atherosclerotic heart disease of native coronary artery without angina pectoris: Secondary | ICD-10-CM | POA: Insufficient documentation

## 2013-02-20 DIAGNOSIS — Z7982 Long term (current) use of aspirin: Secondary | ICD-10-CM | POA: Insufficient documentation

## 2013-02-20 LAB — CBC WITH DIFFERENTIAL/PLATELET
Basophils Absolute: 0.1 10*3/uL (ref 0.0–0.1)
Basophils Relative: 1 % (ref 0–1)
Eosinophils Relative: 4 % (ref 0–5)
HCT: 33.1 % — ABNORMAL LOW (ref 39.0–52.0)
MCHC: 34.7 g/dL (ref 30.0–36.0)
Monocytes Absolute: 1.2 10*3/uL — ABNORMAL HIGH (ref 0.1–1.0)
Neutro Abs: 6.8 10*3/uL (ref 1.7–7.7)
RDW: 12.7 % (ref 11.5–15.5)

## 2013-02-20 LAB — COMPREHENSIVE METABOLIC PANEL
AST: 23 U/L (ref 0–37)
Albumin: 3.7 g/dL (ref 3.5–5.2)
Calcium: 9.5 mg/dL (ref 8.4–10.5)
Chloride: 97 mEq/L (ref 96–112)
Creatinine, Ser: 0.87 mg/dL (ref 0.50–1.35)

## 2013-02-20 MED ORDER — GI COCKTAIL ~~LOC~~
30.0000 mL | Freq: Once | ORAL | Status: AC
  Administered 2013-02-20: 30 mL via ORAL
  Filled 2013-02-20: qty 30

## 2013-02-20 MED ORDER — HYDROCODONE-ACETAMINOPHEN 5-325 MG PO TABS: 2.0000 | ORAL_TABLET | ORAL | Status: DC | PRN

## 2013-02-20 NOTE — ED Provider Notes (Signed)
CSN: [id number]     Arrival date & time 02/20/13  1605 History   First MD Initiated Contact with Patient 02/20/13 1740     Chief Complaint  Patient presents with  . Back Pain  . Abdominal Pain   (Consider location/radiation/quality/duration/timing/severity/associated sxs/prior Treatment) HPI Comments: Patient is a 78 year old male past medical history significant for hypertension, coronary artery disease. He presents today with complaints of epigastric discomfort that he's been experiencing for several months. He was diagnosed with gallbladder disease and had a cholecystectomy done approximately one week ago. This did not seem to help his discomfort. This morning after he drank milk and a banana his pain substantially worsened. He is discomfort and an inability to keep since that time. He denies fevers or chills. There is no diarrhea or bloody stool.  Patient is a 78 y.o. male presenting with abdominal pain. The history is provided by the patient.  Abdominal Pain Pain location:  Epigastric Pain quality: cramping   Pain radiates to:  Does not radiate Pain severity:  Moderate Onset quality:  Gradual Duration: 2 months. Timing:  Constant Progression:  Worsening Chronicity:  New Relieved by:  Nothing Worsened by:  Nothing tried Ineffective treatments:  None tried   Past Medical History  Diagnosis Date  . Diverticulosis 2004    [facility]; due 2014  . Other and unspecified hyperlipidemia   . CAD (coronary artery disease)     Dr Gala Romney  . Unspecified essential hypertension   . Anemia 2010    H/H  12.9/38  . GERD (gastroesophageal reflux disease)     NO MEDS  . RA (rheumatoid arthritis)     Dr Dierdre Forth  AND OA BOTH KNEES AND HANDS   Past Surgical History  Procedure Laterality Date  . Tonsillectomy    . Angioplasty  1997    3 stents  . Knee surgery      x 2  . Carpal tunnel release      bilateral  . Trigger finger release      multiple  . Back surgery  1995    for  ruptured disc LS spine  . Shoulder surgery  2005  . Colonoscopy  2004    Diverticulosis  . Cataract extraction, bilateral  2011  . Total knee arthroplasty  08/07/2011    Procedure: TOTAL KNEE ARTHROPLASTY;  Surgeon: Loanne Drilling, MD;  Location: [facility];  Service: Orthopedics;  Laterality: Right;  . Cholecystectomy N/A 02/13/2013    Procedure: LAPAROSCOPIC CHOLECYSTECTOMY WITH INTRAOPERATIVE CHOLANGIOGRAM;  Surgeon: Clovis Pu. Cornett, MD;  Location: [facility];  Service: General;  Laterality: N/A;   Family History  Problem Relation Age of Onset  . Cancer Father     esophagus  . Stroke Mother     in mid 39s  . Arthritis Mother   . Diabetes Brother   . Heart attack Paternal Grandmother     ? in 17s  . Diabetes Maternal Aunt   . Prostate cancer Maternal Uncle   . Cancer Maternal Uncle   . Prostate cancer Cousin   . Cancer Cousin   . Liver cancer      Paternal family history  . Leukemia      Paternal family history  . Cancer Cousin    History  Substance Use Topics  . Smoking status: Former Smoker -- 2.00 packs/day    Types: Cigarettes    Quit date: 05/29/1972  . Smokeless tobacco: Never Used     Comment: smoked 1957-1974, up to  2 ppd  occ alcohol  . Alcohol Use: Yes     Comment: beer rarely    Review of Systems  Gastrointestinal: Positive for abdominal pain.  All other systems reviewed and are negative.    Allergies  Sulfonamide derivatives; Atorvastatin; Pravastatin sodium; and Simvastatin  Home Medications   Current Outpatient Rx  Name  Route  Sig  Dispense  Refill  . aspirin 325 MG tablet   Oral   Take 325 mg by mouth daily.         . budesonide-formoterol (SYMBICORT) 160-4.5 MCG/ACT inhaler   Inhalation   Inhale 2 puffs into the lungs 2 (two) times daily as needed (allergies).         . Calcium Carbonate-Vitamin D (CALCIUM 600 + D PO)   Oral   Take by mouth daily.         . Coenzyme Q10 (CO Q 10 PO)   Oral   Take 1 tablet by mouth daily.          . Cyanocobalamin (VITAMIN B 12 PO)   Oral   Take 1 tablet by mouth daily.         . folic acid (FOLVITE) 1 MG tablet   Oral   Take 1 mg by mouth daily.         . Ginkgo Biloba 200 MG CAPS   Oral   Take by mouth daily.         Marland Kitchen HUMIRA PEN 40 MG/0.8ML injection      40 mg every 14 (fourteen) days.          . Hydrocodone-Acetaminophen 5-300 MG TABS   Oral   Take 1 tablet by mouth 3 (three) times daily as needed (for pain).         . methotrexate 25 MG/ML injection      25 mg once a week.          . metoprolol succinate (TOPROL-XL) 25 MG 24 hr tablet   Oral   Take 25 mg by mouth daily.         . Multiple Vitamins-Minerals (MULTIVITAMIN WITH MINERALS) tablet   Oral   Take 1 tablet by mouth daily.         . Omega-3 Fatty Acids (FISH OIL TRIPLE STRENGTH) 1400 MG CAPS   Oral   Take by mouth daily.         Marland Kitchen oxyCODONE-acetaminophen (ROXICET) 5-325 MG per tablet   Oral   Take 1 tablet by mouth every 4 (four) hours as needed for pain.   30 tablet   0   . PARoxetine (PAXIL) 20 MG tablet   Oral   Take 20 mg by mouth every morning.         Bertram Gala Glycol-Propyl Glycol (SYSTANE) 0.4-0.3 % SOLN   Ophthalmic   Apply 1 drop to eye 2 (two) times daily.         . pregabalin (LYRICA) 75 MG capsule   Oral   Take 1 capsule (75 mg total) by mouth 2 (two) times daily.   28 capsule   0   . primidone (MYSOLINE) 50 MG tablet               . rosuvastatin (CRESTOR) 20 MG tablet   Oral   Take 1 tablet (20 mg total) by mouth at bedtime.   90 tablet   0   . tamsulosin (FLOMAX) 0.4 MG CAPS   Oral   Take 1 capsule (  0.4 mg total) by mouth daily.   30 capsule   5    BP 127/64  Pulse 68  Temp(Src) 98.8 F (37.1 C) (Oral)  Resp 20  SpO2 96% Physical Exam  Vitals reviewed. Constitutional: He is oriented to person, place, and time. He appears well-developed and well-nourished. No distress.  HENT:  Head: Normocephalic and atraumatic.   Mouth/Throat: Oropharynx is clear and moist.  Neck: Normal range of motion. Neck supple.  Cardiovascular: Normal rate, regular rhythm and normal heart sounds.   No murmur heard. Pulmonary/Chest: Effort normal and breath sounds normal. No respiratory distress. He has no wheezes.  Abdominal: Soft. Bowel sounds are normal. He exhibits no distension. There is tenderness.  There is tenderness to palpation in the epigastric region. There is no rebound and no guarding. The incision sites from the previous laparoscopic cholecystectomy appear clean and healing appropriately. There is no surrounding erythema.  Musculoskeletal: Normal range of motion. He exhibits no edema.  Neurological: He is alert and oriented to person, place, and time.  Skin: Skin is warm and dry. He is not diaphoretic.    ED Course  Procedures (including critical care time) Labs Review Labs Reviewed  CBC WITH DIFFERENTIAL  COMPREHENSIVE METABOLIC PANEL  LIPASE, BLOOD   Imaging Review No results found.  MDM  No diagnosis found. Patient presents here with complaints of epigastric pain one week status post laparoscopic cholecystectomy. Workup reveals mild white count of 11 with liver functions and lipase otherwise unremarkable. He was given a GI cocktail and experienced near complete relief. I doubt he has any sort of retained stone. I doubt there is an abscess as the patient is afebrile. This is the same pain that he was having that led up to his cholecystectomy and he is convinced that he has an ulcer. Since his pain improved with a GI cocktail I suspect that he may be right. On reviewing the ultrasound he had done before surgery it was noted that he had a normal sized aorta without evidence for AAA and the remainder of the internal organs appear unremarkable. He will be discharged with Prilosec, pain medication. I have advised him to followup with Dr. Luisa Hart next week for followup. He may ultimately require upper endoscopy if  his symptoms do not improve.    Geoffery Lyons, MD 02/20/13 2002

## 2013-02-20 NOTE — Telephone Encounter (Signed)
Pam with [facility] in [place] called stating the patient has been calling them regarding severe epigastric pain.  Pam states that patient reports pain is just as bad if not worse then before his Lap Chole on 02/13/13.  Patient reports to her that pain radiates all the way through him to his back and the Norco nor Oxycodone help with the pain.  Patient also reports nausea on and off again but denies vomiting.  Patient denies signs of infection at incision sites.  States normal urination and BMs.  Pam called due to they feel it would be better for patient to be seen by [place] since we were the most recent to do surgery.  She asked that we call patient with the plan forward.  Explained to [facility] that Dr. Luisa Hart is in the office so I will discuss this with him and once he has determined the most appropriate plan we will update the patient.  She states understanding and agreeable at this time.

## 2013-02-20 NOTE — Telephone Encounter (Signed)
Lm @ (5:08pm) asking the pt to RTC regarding note below.//AB/CMA

## 2013-02-20 NOTE — ED Notes (Signed)
Pt c/o mid back pain that radiates into abdomen x "a couple months."  Pain score 5/10.  Sts pain increases after eating.  Sts gallbladder removed 9/18 at [facility].  Hx of ulcers.

## 2013-02-20 NOTE — Telephone Encounter (Signed)
Spoke to Dr. Luisa Hart who states he would recommend patient go to the ED for evaluation.  Dr. Luisa Hart states epigastric pain could be due to other things than the surgery and he just thinks it would be appropriate for patient to have an ED workup.  Also due to Oxycodone and Norco not helping to control pain, the ED may be able to get pain back under control which will make it easier possibly for patient to maintain at home.  Called and spoke to patient regarding symptoms and with Dr. Luisa Hart recommended.  Patient states understanding and agreeable with plan.  Patient states he will go to [facility] ED this afternoon for an evaluation of the pain and what may be causing it.

## 2013-02-20 NOTE — Telephone Encounter (Signed)
Patient Information:  Caller Name: [place]  Phone: [phone]  Patient: Ethan, Robinson  Gender: Male  DOB: [birth year]  Age: 78 Years  PCP: Marga Melnick  Office Follow Up:  Does the office need to follow up with this patient?: No  Instructions For The Office: N/A  RN Note:  Pt complaining of epigastric pain, radiating into Back, Gallbladder removed on 9-18 by Dr Luisa Hart, [facility], [phone].  Pt has f/u w/ GI on 10-6, scheduled  w/ scan.  Pt states, pain is worse after he eats, but remains constant.  Pt does not want to go to ED.  Assisted Pt w/ call to Dr Rosezena Sensor office, spoke w/ RN, MD will call Pt back directly.  Symptoms  Reason For Call & Symptoms: Epigastric pain, radiating into Back, Gallbladder removed 9-18  Reviewed Health History In EMR: Yes  Reviewed Medications In EMR: Yes  Reviewed Allergies In EMR: N/A  Reviewed Surgeries / Procedures: Yes  Date of Onset of Symptoms: 12/20/2012  Treatments Tried: Vicoden and Oxycodone  Treatments Tried Worked: No  Guideline(s) Used:  Abdominal Pain - Male  Disposition Per Guideline:   Go to ED Now (or to Office with PCP Approval)  Reason For Disposition Reached:   Constant abdominal pain lasting > 2 hours  Advice Given:  N/A  Patient Refused Recommendation:  Patient Refused Care Advice  See RN note

## 2013-02-20 NOTE — Telephone Encounter (Signed)
You he definitely should contact the surgeon's office and let them know of the significant symptoms. If symptoms persist he should be seen emergently

## 2013-02-21 ENCOUNTER — Telehealth: Payer: Self-pay | Admitting: *Deleted

## 2013-02-21 NOTE — Telephone Encounter (Signed)
Patients wife, Steward Drone, called in today to let us know that the patient was seen in the ER regarding his symptoms and was told that he has a stomach ulcer. She stated that the patient was prescribed Prilosec BID and to consume a bland diet. She stated that patient is drinking plenty of water and is resting well this morning.

## 2013-02-21 NOTE — Telephone Encounter (Signed)
Thank you very much for the followup; thankfully this was not related to any perioperative complication which was a concern

## 2013-02-25 ENCOUNTER — Encounter: Payer: Self-pay | Admitting: Internal Medicine

## 2013-02-25 ENCOUNTER — Other Ambulatory Visit: Payer: Self-pay | Admitting: Internal Medicine

## 2013-02-25 DIAGNOSIS — F32A Depression, unspecified: Secondary | ICD-10-CM

## 2013-02-25 DIAGNOSIS — F329 Major depressive disorder, single episode, unspecified: Secondary | ICD-10-CM

## 2013-02-25 NOTE — Telephone Encounter (Signed)
Refill for Paxil sent to [facility] on [address].

## 2013-03-03 ENCOUNTER — Ambulatory Visit (INDEPENDENT_AMBULATORY_CARE_PROVIDER_SITE_OTHER): Payer: Medicare Other | Admitting: Surgery

## 2013-03-03 ENCOUNTER — Encounter (INDEPENDENT_AMBULATORY_CARE_PROVIDER_SITE_OTHER): Payer: Self-pay | Admitting: Surgery

## 2013-03-03 VITALS — BP 122/74 | HR 64 | Resp 14 | Ht 70.5 in | Wt 199.2 lb

## 2013-03-03 DIAGNOSIS — Z9889 Other specified postprocedural states: Secondary | ICD-10-CM

## 2013-03-03 NOTE — Progress Notes (Signed)
Ethan Robinson       DOB: 1934/07/12           DATE: 03/03/2013       ZOX:[id number]   CC: No chief complaint on file.    Impression:  The patient appears to be doing well, with improvement in his symptoms.  Plan:  He may resume full activity and regular diet. He  will followup with [place] on a p.r.n. basis. I did tell him that he may still have some foods that cause indigestion and ask him to call us if there are any questions, problems or concerns.he is scheduled to see gastroenterology for this back pain he was having it seemed to get better with a GI cocktail in the emergency room. I told with his workup also diseases negative and the pain returns MRCP  Is the next.  He did have some filling defects during his cholangiogram which I discussed with his wife and the patient postoperatively. I told him if he were to have postoperative pain either short-term or long-term that MRCP would be indicated. Since his pain got better with  the GI cocktail it seems unlikely this caused by any retained common bile duct stone.  HPI:  This patient underwent a laparoscopic cholecystectomy and  operative cholangiogram on 02/13/2013. He is in for his first postoperative visit. He notes that his incisional pain has resolved. His preoperative symptoms have improved.he was seen one week after surgery the emergency room for back pain. Workup revealed normal LFTs and his pain got better with GI cocktail. He is set to see a gastrologist since he has a history of peptic ulcer disease. He is not having problems with nausea, vomiting, diarrhea, fevers, chills, or urinary symptoms. He is tolerating diet. He feels that he is progressing well and nearly back to normal. PE:  VS: BP 122/74  Pulse 64  Resp 14  Ht 5' 10.5" (1.791 m)  Wt 199 lb 3.2 oz (90.357 kg)  BMI 28.17 kg/m2  General: The patient is alert and appears comfortable, NAD.  Abdomen: Soft and benign. The incisions are healing nicely. There are no  apparent problems.  Data reviewed: IOC:  some filling defect noted in common bile duct which appeared to be sludge. Small air bubble noted as well. Pathology:  chronic cholecystitis and stones

## 2013-03-03 NOTE — Telephone Encounter (Signed)
Spoke with the pt and he stated that he was seen by surgeon and he had his gallbladder removed(2 weeks ago), but it did not help with the pain he was having.  Pt stated that he feels he has a ulcer.  He was put on Prilosec 20mg  1 bid,and scheduled an appt with Dr. Brennan Bailey) on (03-31-13).  Pt said the Prilosec 20mg  is working very well.//AB/CMA

## 2013-03-03 NOTE — Patient Instructions (Signed)
Keep appt with GI medicine.  Return as needed.

## 2013-03-21 ENCOUNTER — Encounter (INDEPENDENT_AMBULATORY_CARE_PROVIDER_SITE_OTHER): Payer: Self-pay

## 2013-03-21 ENCOUNTER — Ambulatory Visit (INDEPENDENT_AMBULATORY_CARE_PROVIDER_SITE_OTHER): Payer: Medicare Other | Admitting: Nurse Practitioner

## 2013-03-21 ENCOUNTER — Encounter: Payer: Self-pay | Admitting: Nurse Practitioner

## 2013-03-21 VITALS — BP 112/61 | HR 55 | Ht 70.5 in | Wt 205.0 lb

## 2013-03-21 DIAGNOSIS — R259 Unspecified abnormal involuntary movements: Secondary | ICD-10-CM

## 2013-03-21 MED ORDER — PRIMIDONE 50 MG PO TABS: 100.0000 mg | ORAL_TABLET | Freq: Two times a day (BID) | ORAL | Status: DC

## 2013-03-21 NOTE — Progress Notes (Signed)
[facility]  PATIENT: Ethan Robinson DOB: 1934/10/28   REASON FOR VISIT:follow up for tremor   HISTORY OF PRESENT ILLNESS:Mr Wordell, 78 year old white male returns for followup he has history of bilateral hand tremor for greater than 11 years. He has been a patient of our clinic Since 2010, bilateral hands, and head tremor, no family history of tremor, retired now but wants to continue medication because of difficulty writing, using keyboard, recent knee replacement, mild gait difficulty. He previously tried low-dose Primidone, responded mildly. He has  past medical history of depression,anxiety, hyperlipidemia, coronary artery disease. No gait difficulty, no limitation on his daily activity, performs all activities of daily living without difficulty. Has a history of ringing in his ears and hearing loss due to working in a noisy environment for years.   REVIEW OF SYSTEMS: Full 14 system review of systems performed and notable only for:  Constitutional: N/A  Cardiovascular: N/A  Ear/Nose/Throat: Hearing loss  Skin: N/A  Eyes: N/A  Respiratory: Cough Gastroitestinal: N/A  Hematology/Lymphatic: N/A  Endocrine: N/A Musculoskeletal: Joint pain  Allergy/Immunology: N/A  Neurological: Tremor Psychiatric: Decreased energy   ALLERGIES: Allergies  Allergen Reactions  . Sulfonamide Derivatives Rash    Medi Alert bracelet  is recommended  . Atorvastatin Other (See Comments)    Cramping    . Pravastatin Sodium Other (See Comments)    Cramp   . Simvastatin Other (See Comments)    Cramps     HOME MEDICATIONS: Outpatient Prescriptions Prior to Visit  Medication Sig Dispense Refill  . aspirin 325 MG tablet Take 325 mg by mouth daily.      . budesonide-formoterol (SYMBICORT) 160-4.5 MCG/ACT inhaler Inhale 2 puffs into the lungs 2 (two) times daily as needed (allergies).      . Calcium Carbonate-Vitamin D (CALCIUM 600 + D PO) Take by mouth daily.      .  Coenzyme Q10 (CO Q 10 PO) Take 1 tablet by mouth daily.      . Cyanocobalamin (VITAMIN B 12 PO) Take 1 tablet by mouth daily.      . folic acid (FOLVITE) 1 MG tablet Take 1 mg by mouth daily.      . Ginkgo Biloba 200 MG CAPS Take by mouth daily.      Marland Kitchen HUMIRA PEN 40 MG/0.8ML injection 40 mg every 14 (fourteen) days.       Marland Kitchen HYDROcodone-acetaminophen (NORCO) 5-325 MG per tablet Take 2 tablets by mouth every 4 (four) hours as needed for pain.  20 tablet  0  . Hydrocodone-Acetaminophen 5-300 MG TABS Take 1 tablet by mouth 3 (three) times daily as needed (for pain).      . methotrexate 25 MG/ML injection 25 mg once a week.       . metoprolol succinate (TOPROL-XL) 25 MG 24 hr tablet Take 25 mg by mouth daily.      . Multiple Vitamins-Minerals (MULTIVITAMIN WITH MINERALS) tablet Take 1 tablet by mouth daily.      . Omega-3 Fatty Acids (FISH OIL TRIPLE STRENGTH) 1400 MG CAPS Take by mouth daily.      Marland Kitchen oxyCODONE-acetaminophen (ROXICET) 5-325 MG per tablet Take 1 tablet by mouth every 4 (four) hours as needed for pain.  30 tablet  0  . PARoxetine (PAXIL) 20 MG tablet TAKE ONE TABLET BY MOUTH ONCE DAILY  30 tablet  2  . Polyethyl Glycol-Propyl Glycol (SYSTANE) 0.4-0.3 % SOLN Apply 1 drop to eye 2 (two) times daily.      Marland Kitchen  pregabalin (LYRICA) 75 MG capsule Take 1 capsule (75 mg total) by mouth 2 (two) times daily.  28 capsule  0  . primidone (MYSOLINE) 50 MG tablet       . rosuvastatin (CRESTOR) 20 MG tablet Take 1 tablet (20 mg total) by mouth at bedtime.  90 tablet  0  . tamsulosin (FLOMAX) 0.4 MG CAPS Take 1 capsule (0.4 mg total) by mouth daily.  30 capsule  5  . PARoxetine (PAXIL) 20 MG tablet Take 20 mg by mouth every morning.       No facility-administered medications prior to visit.    PAST MEDICAL HISTORY: Past Medical History  Diagnosis Date  . Diverticulosis 2004    [facility]; due 2014  . Other and unspecified hyperlipidemia   . CAD (coronary artery disease)     Dr Gala Romney  .  Unspecified essential hypertension   . Anemia 2010    H/H  12.9/38  . GERD (gastroesophageal reflux disease)     NO MEDS  . RA (rheumatoid arthritis)     Dr Dierdre Forth  AND OA BOTH KNEES AND HANDS    PAST SURGICAL HISTORY: Past Surgical History  Procedure Laterality Date  . Tonsillectomy    . Angioplasty  1997    3 stents  . Knee surgery      x 2  . Carpal tunnel release      bilateral  . Trigger finger release      multiple  . Back surgery  1995    for ruptured disc LS spine  . Shoulder surgery  2005  . Colonoscopy  2004    Diverticulosis  . Cataract extraction, bilateral  2011  . Total knee arthroplasty  08/07/2011    Procedure: TOTAL KNEE ARTHROPLASTY;  Surgeon: Loanne Drilling, MD;  Location: [facility];  Service: Orthopedics;  Laterality: Right;  . Cholecystectomy N/A 02/13/2013    Procedure: LAPAROSCOPIC CHOLECYSTECTOMY WITH INTRAOPERATIVE CHOLANGIOGRAM;  Surgeon: Clovis Pu. Cornett, MD;  Location: [facility];  Service: General;  Laterality: N/A;    FAMILY HISTORY: Family History  Problem Relation Age of Onset  . Cancer Father     esophagus  . Stroke Mother     in mid 8s  . Arthritis Mother   . Diabetes Brother   . Heart attack Paternal Grandmother     ? in 70s  . Diabetes Maternal Aunt   . Prostate cancer Maternal Uncle   . Cancer Maternal Uncle   . Prostate cancer Cousin   . Cancer Cousin   . Liver cancer      Paternal family history  . Leukemia      Paternal family history  . Cancer Cousin     SOCIAL HISTORY: History   Social History  . Marital Status: Married    Spouse Name: Steward Drone    Number of Children: 1  . Years of Education: 12   Occupational History  .      retired   Social History Main Topics  . Smoking status: Former Smoker -- 2.00 packs/day    Types: Cigarettes    Quit date: 05/29/1972  . Smokeless tobacco: Never Used     Comment: smoked 1957-1974, up to 2 ppd  occ alcohol  . Alcohol Use: Yes     Comment: beer rarely  . Drug Use: No  .  Sexual Activity: Not on file   Other Topics Concern  . Not on file   Social History Narrative   Patient lives with  his wife(Brenda).   Patient has 1 child.   Patient has a high school education.   Patient is right-handed.   Patient is retired.     PHYSICAL EXAM  Filed Vitals:   03/21/13 1413  BP: 112/61  Pulse: 55  Height: 5' 10.5" (1.791 m)  Weight: 205 lb (92.987 kg)   Body mass index is 28.99 kg/(m^2).  Generalized: Well developed, in no acute distress  Head: normocephalic and atraumatic,. Oropharynx benign  Neck: Supple, no carotid bruits  Cardiac: Regular rate rhythm, no murmur  Musculoskeletal: No deformity   Neurological examination   Mentation: Alert oriented to time, place, history taking. Follows all commands speech and language fluent  Cranial nerve II-XII: Pupils were equal round reactive to light extraocular movements were full, visual field were full on confrontational test. Facial sensation and strength were normal. hearing was intact to finger rubbing bilaterally. Uvula tongue midline. head turning and shoulder shrug and were normal and symmetric.Tongue protrusion into cheek strength was normal. Motor: normal bulk and tone, full strength in the BUE, BLE, mild postural tremor, no rigidity no weakness  Sensory: normal and symmetric to light touch, pinprick, and  vibration  Coordination: finger-nose-finger, heel-to-shin bilaterally, no dysmetria Reflexes: Brachioradialis 2/2, biceps 2/2, triceps 2/2, patellar 2/2, Achilles 1/1, plantar responses were flexor bilaterally. Gait and Station: Rising up from seated position without assistance, wide base  stance, moderate stride, good arm swing, smooth turning, unable to perform tiptoe, and heel walking without difficulty. Tandem gait unsteady  DIAGNOSTIC DATA (LABS, IMAGING, TESTING) - I reviewed patient records, labs, notes, testing and imaging myself where available.  Lab Results  Component Value Date   WBC  11.1* 02/20/2013   HGB 11.5* 02/20/2013   HCT 33.1* 02/20/2013   MCV 93.8 02/20/2013   PLT 320 02/20/2013      Component Value Date/Time   NA 132* 02/20/2013 1840   K 4.5 02/20/2013 1840   CL 97 02/20/2013 1840   CO2 23 02/20/2013 1840   GLUCOSE 102* 02/20/2013 1840   BUN 17 02/20/2013 1840   CREATININE 0.87 02/20/2013 1840   CALCIUM 9.5 02/20/2013 1840   PROT 7.3 02/20/2013 1840   ALBUMIN 3.7 02/20/2013 1840   AST 23 02/20/2013 1840   ALT 18 02/20/2013 1840   ALKPHOS 36* 02/20/2013 1840   BILITOT 0.4 02/20/2013 1840   GFRNONAA 82* 02/20/2013 1840   GFRAA >90 02/20/2013 1840   Lab Results  Component Value Date   CHOL 147 09/25/2012   HDL 48.60 09/25/2012   LDLCALC 75 09/25/2012   TRIG 117.0 09/25/2012   CHOLHDL 3 09/25/2012   ASSESSMENT AND PLAN  78 y.o. year old male  has a past medical history essential tremor, there is no rigidity no loss of sense of smell and no weakness  Continue primidone 50 mg 2 tablets at 10 AM and 6 PM, will refill for 3 months/3 refills Followup yearly and when necessary  Nilda Riggs, [facility], APRN  [facility] [address] [place], [place] [address] [phone]

## 2013-03-21 NOTE — Patient Instructions (Signed)
Continue primidone 50 mg 2 tablets at 10 AM and 6 PM, will refill for 3 months/3 reills Followup yearly and when necessary

## 2013-03-26 ENCOUNTER — Other Ambulatory Visit: Payer: Self-pay | Admitting: Internal Medicine

## 2013-03-26 NOTE — Telephone Encounter (Signed)
Flomax refill sent to pharmacy 

## 2013-03-28 ENCOUNTER — Encounter: Payer: Self-pay | Admitting: Internal Medicine

## 2013-03-31 ENCOUNTER — Ambulatory Visit: Payer: Medicare Other | Admitting: Internal Medicine

## 2013-05-23 ENCOUNTER — Other Ambulatory Visit: Payer: Self-pay | Admitting: Internal Medicine

## 2013-05-23 NOTE — Telephone Encounter (Signed)
Paroxetine refilled per protocol. JG//CMA 

## 2013-06-11 ENCOUNTER — Encounter: Payer: Self-pay | Admitting: Internal Medicine

## 2013-06-11 ENCOUNTER — Ambulatory Visit (INDEPENDENT_AMBULATORY_CARE_PROVIDER_SITE_OTHER): Payer: Medicare Other | Admitting: Internal Medicine

## 2013-06-11 VITALS — BP 121/63 | HR 54 | Temp 98.3°F | Wt 204.2 lb

## 2013-06-11 DIAGNOSIS — E785 Hyperlipidemia, unspecified: Secondary | ICD-10-CM

## 2013-06-11 DIAGNOSIS — H02409 Unspecified ptosis of unspecified eyelid: Secondary | ICD-10-CM

## 2013-06-11 DIAGNOSIS — D649 Anemia, unspecified: Secondary | ICD-10-CM

## 2013-06-11 DIAGNOSIS — H02403 Unspecified ptosis of bilateral eyelids: Secondary | ICD-10-CM

## 2013-06-11 DIAGNOSIS — I2510 Atherosclerotic heart disease of native coronary artery without angina pectoris: Secondary | ICD-10-CM

## 2013-06-11 DIAGNOSIS — J45909 Unspecified asthma, uncomplicated: Secondary | ICD-10-CM

## 2013-06-11 DIAGNOSIS — I10 Essential (primary) hypertension: Secondary | ICD-10-CM

## 2013-06-11 NOTE — Progress Notes (Signed)
Subjective:    Patient ID: Ethan Robinson, male    DOB: 12/18/1934, 79 y.o.   MRN: [id number]  HPI  He is scheduled for bilateral direct brow pexy (left brow ptosis worse than right) with bilateral upper eyelid blepharoplasty and external levator advancement. Surgery is scheduled for 06/30/2013 at [facility] with [facility], [place].   Blood pressure controlled by history.  Compliant with anti hypertemsive medication. A modified  heart healthy diet is followed LDL goal is less than 70. Last LDL was 75 on 09/25/2012. HDL 48.60, Triglycerides 117.  There is medication compliance with the statin.  Full dose ASA taken.    Review of Systems  No lightheadedness or other adverse medication effect described.  Significant headaches, epistaxis, chest pain, palpitations, exertional dyspnea, claudication, paroxysmal nocturnal dyspnea, or edema absent. No use of rescue inhaler. Significant abdominal symptoms, memory deficit, or myalgias not present.   He had post cholecystectomy bleed.requiring transfusion. He denies epistaxis, hemoptysis, hematuria, melena, or rectal bleeding.He has no unexplained weight loss, dysphagia, or abdominal pain. He has no abnormal bruising or bleeding. He has no difficulty stopping bleeding with injury.     Objective:   Physical Exam Gen.:  well-nourished in appearance. Alert, appropriate and cooperative throughout exam.Appears younger than stated age  Head: Normocephalic without obvious abnormalities  Eyes: No corneal or conjunctival inflammation noted. Pupils equal round reactive to light and accommodation. Extraocular motion intact. Ptosis bilaterally Ears: External  ear exam reveals no significant lesions or deformities. Canals clear .TMs normal. Hearing is grossly normal bilaterally. Nose: External nasal exam reveals no deformity or inflammation. Nasal mucosa are pink and moist. No lesions or exudates noted.  Mouth: Oral  mucosa and oropharynx reveal no lesions or exudates. Teeth in good repair. Neck: No deformities, masses, or tenderness noted.  Thyroid normal. Lungs: Normal respiratory effort; chest expands symmetrically. Lungs are clear to auscultation without rales, wheezes, or increased work of breathing. Distant BS Heart: Normal rate and rhythm.Ditant heart sounds. Normal S1; accentuated  S2. No gallop, click, or rub.Grade 1/6 systolic murmur. Abdomen: Bowel sounds normal; abdomen soft and nontender. No masses or organomegaly .Ventral hernia noted.                                  Musculoskeletal/extremities: No deformity or scoliosis noted of  the thoracic or lumbar spine.   No clubbing, cyanosis, edema, or significant extremity  deformity noted. Range of motion normal .Tone & strength normal. Hand joints reveal some  DJD DIP changes. Fingernail health good. Able to lie down & sit up w/o help.  Vascular: Carotid, radial artery, dorsalis pedis and  posterior tibial pulses are  Equal.Decreased pedal pulses. No bruits present. Neurologic: Alert and oriented x3. Deep tendon reflexes symmetrical and 0- 1/2 +.        Skin: Intact without suspicious lesions or rashes. Lymph: No cervical, axillary lymphadenopathy present. Psych: Mood and affect are normal. Normally interactive  Assessment & Plan:  #1 symptomatic ptosis #2 See Current Assessment & Plan in Problem List under specific Diagnosis See pre op orders

## 2013-06-11 NOTE — Patient Instructions (Signed)
Share results with Dr Sherryll Burger

## 2013-06-11 NOTE — Progress Notes (Signed)
Pre visit review using our clinic review tool, if applicable. No additional management support is needed unless otherwise documented below in the visit note. 

## 2013-06-12 ENCOUNTER — Telehealth: Payer: Self-pay | Admitting: Internal Medicine

## 2013-06-12 ENCOUNTER — Other Ambulatory Visit (INDEPENDENT_AMBULATORY_CARE_PROVIDER_SITE_OTHER): Payer: Medicare Other

## 2013-06-12 DIAGNOSIS — I10 Essential (primary) hypertension: Secondary | ICD-10-CM

## 2013-06-12 DIAGNOSIS — D649 Anemia, unspecified: Secondary | ICD-10-CM

## 2013-06-12 DIAGNOSIS — E785 Hyperlipidemia, unspecified: Secondary | ICD-10-CM

## 2013-06-12 LAB — CBC WITH DIFFERENTIAL/PLATELET
BASOS PCT: 0.6 % (ref 0.0–3.0)
Basophils Absolute: 0 10*3/uL (ref 0.0–0.1)
Eosinophils Absolute: 0.6 10*3/uL (ref 0.0–0.7)
Eosinophils Relative: 7.9 % — ABNORMAL HIGH (ref 0.0–5.0)
HCT: 38.1 % — ABNORMAL LOW (ref 39.0–52.0)
Hemoglobin: 12.7 g/dL — ABNORMAL LOW (ref 13.0–17.0)
LYMPHS PCT: 31.7 % (ref 12.0–46.0)
Lymphs Abs: 2.4 10*3/uL (ref 0.7–4.0)
MCHC: 33.2 g/dL (ref 30.0–36.0)
MCV: 96.3 fl (ref 78.0–100.0)
MONOS PCT: 10.7 % (ref 3.0–12.0)
Monocytes Absolute: 0.8 10*3/uL (ref 0.1–1.0)
NEUTROS ABS: 3.7 10*3/uL (ref 1.4–7.7)
Neutrophils Relative %: 49.1 % (ref 43.0–77.0)
Platelets: 225 10*3/uL (ref 150.0–400.0)
RBC: 3.95 Mil/uL — AB (ref 4.22–5.81)
RDW: 14.1 % (ref 11.5–14.6)
WBC: 7.5 10*3/uL (ref 4.5–10.5)

## 2013-06-12 LAB — HEPATIC FUNCTION PANEL
ALBUMIN: 4.1 g/dL (ref 3.5–5.2)
ALT: 18 U/L (ref 0–53)
AST: 24 U/L (ref 0–37)
Alkaline Phosphatase: 35 U/L — ABNORMAL LOW (ref 39–117)
BILIRUBIN DIRECT: 0.1 mg/dL (ref 0.0–0.3)
TOTAL PROTEIN: 7.2 g/dL (ref 6.0–8.3)
Total Bilirubin: 0.5 mg/dL (ref 0.3–1.2)

## 2013-06-12 LAB — BASIC METABOLIC PANEL
BUN: 15 mg/dL (ref 6–23)
CALCIUM: 9.3 mg/dL (ref 8.4–10.5)
CO2: 28 meq/L (ref 19–32)
Chloride: 103 mEq/L (ref 96–112)
Creatinine, Ser: 1 mg/dL (ref 0.4–1.5)
GFR: 78 mL/min (ref >60.00)
GLUCOSE: 100 mg/dL — AB (ref 70–99)
Potassium: 4.2 mEq/L (ref 3.5–5.1)
Sodium: 136 mEq/L (ref 135–145)

## 2013-06-12 NOTE — Telephone Encounter (Signed)
Relevant patient education assigned to patient using Emmi. ° °

## 2013-06-25 ENCOUNTER — Other Ambulatory Visit: Payer: Self-pay | Admitting: Internal Medicine

## 2013-06-25 NOTE — Telephone Encounter (Signed)
Metoprolol and Crestor refilled per protocol. JG//CMA

## 2013-08-25 ENCOUNTER — Other Ambulatory Visit: Payer: Self-pay | Admitting: *Deleted

## 2013-08-25 MED ORDER — PAROXETINE HCL 20 MG PO TABS
ORAL_TABLET | ORAL | Status: DC
Start: 2013-08-25 — End: 2013-11-25

## 2013-08-25 NOTE — Telephone Encounter (Signed)
Rx sent to the pharmacy by e-script.//AB/CMA 

## 2013-10-10 ENCOUNTER — Other Ambulatory Visit: Payer: Self-pay | Admitting: *Deleted

## 2013-10-10 MED ORDER — TAMSULOSIN HCL 0.4 MG PO CAPS: ORAL_CAPSULE | ORAL | Status: DC

## 2013-11-25 ENCOUNTER — Other Ambulatory Visit: Payer: Self-pay | Admitting: Internal Medicine

## 2013-11-25 NOTE — Telephone Encounter (Signed)
OK X3 

## 2014-01-01 ENCOUNTER — Other Ambulatory Visit: Payer: Self-pay

## 2014-01-01 MED ORDER — METOPROLOL SUCCINATE ER 25 MG PO TB24: 25.0000 mg | ORAL_TABLET | Freq: Every day | ORAL | Status: DC

## 2014-01-20 ENCOUNTER — Telehealth: Payer: Self-pay

## 2014-01-20 NOTE — Telephone Encounter (Signed)
Phone call from patient requesting a referral to kidney doctor since he is having trouble urinating. Please advise.

## 2014-01-20 NOTE — Telephone Encounter (Signed)
Patient advised he should be seen and was transferred to scheduling.

## 2014-01-20 NOTE — Telephone Encounter (Signed)
He should be seen to possibly start meds as may take while for Urology to see him

## 2014-01-21 ENCOUNTER — Ambulatory Visit (INDEPENDENT_AMBULATORY_CARE_PROVIDER_SITE_OTHER): Payer: Medicare Other | Admitting: Internal Medicine

## 2014-01-21 ENCOUNTER — Encounter: Payer: Self-pay | Admitting: Internal Medicine

## 2014-01-21 VITALS — BP 160/82 | HR 58 | Temp 98.2°F | Wt 199.1 lb

## 2014-01-21 DIAGNOSIS — N3943 Post-void dribbling: Secondary | ICD-10-CM

## 2014-01-21 DIAGNOSIS — R3989 Other symptoms and signs involving the genitourinary system: Secondary | ICD-10-CM

## 2014-01-21 DIAGNOSIS — R3911 Hesitancy of micturition: Secondary | ICD-10-CM

## 2014-01-21 DIAGNOSIS — N399 Disorder of urinary system, unspecified: Secondary | ICD-10-CM

## 2014-01-21 LAB — POCT URINALYSIS DIPSTICK
BILIRUBIN UA: NEGATIVE
Glucose, UA: NEGATIVE
Ketones, UA: NEGATIVE
LEUKOCYTES UA: NEGATIVE
Nitrite, UA: NEGATIVE
PH UA: 7
Protein, UA: NEGATIVE
RBC UA: NEGATIVE
Spec Grav, UA: 1.015
Urobilinogen, UA: NEGATIVE

## 2014-01-21 NOTE — Progress Notes (Signed)
Pre visit review using our clinic review tool, if applicable. No additional management support is needed unless otherwise documented below in the visit note. 

## 2014-01-21 NOTE — Progress Notes (Signed)
   Subjective:    Patient ID: Ethan Robinson, male    DOB: [birth year], 79 y.o.   MRN: [id number]  HPI   He describes difficulty starting his stream as well as postvoid dribbling for @ least 2 months.  He is on generic Flomax.  He has no other genitourinary symptoms or constitutional symptoms  Maternal uncle had prostate cancer in his 40s.      Review of Systems Dysuria, pyuria, hematuria, frequency, nocturia or polyuria are denied.     Objective:   Physical Exam   Positive or pertinent findings include: Repeat blood pressure 120/75. He has a slow intermittent side-to-side tremor of the head Ventral hernia is present in the abdomen without other organomegaly or masses. DIP osteoarthritic changes present. Pedal pulses are decreased. He has no ischemic changes the feet There is a small varicocele on the left. Otherwise the genitourinary exam is unremarkable. Specifically the prostate is small.  General appearance :adequately nourished; in no distress. Eyes: No conjunctival inflammation or scleral icterus is present. Heart:  Normal rate and regular rhythm. S1 and S2 normal without gallop, murmur, click, rub or other extra sounds  Lungs:Chest clear to auscultation; no wheezes, rhonchi,rales ,or rubs present.No increased work of breathing.  Abdomen: bowel sounds normal, soft and non-tender without masses, organomegaly or hernias noted.  No guarding or rebound.  Skin:Warm & dry.  Intact without suspicious lesions or rashes ; no jaundice or tenting Lymphatic: No lymphadenopathy is noted about the head, neck, axilla, or inguinal areas.             Assessment & Plan:  #1 urinary hesitancy  #2 post voiding dribbling  #3 small prostate on digital rectal exam  #4 normal urinalysis  Plan: Urology assessment

## 2014-01-21 NOTE — Patient Instructions (Signed)
The Urology  referral will be scheduled and you'll be notified of the time. 

## 2014-02-11 ENCOUNTER — Other Ambulatory Visit: Payer: Self-pay | Admitting: Nurse Practitioner

## 2014-02-24 ENCOUNTER — Ambulatory Visit: Payer: Medicare Other | Admitting: Nurse Practitioner

## 2014-02-24 ENCOUNTER — Ambulatory Visit (INDEPENDENT_AMBULATORY_CARE_PROVIDER_SITE_OTHER): Payer: Medicare Other | Admitting: Nurse Practitioner

## 2014-02-24 ENCOUNTER — Encounter: Payer: Self-pay | Admitting: Nurse Practitioner

## 2014-02-24 ENCOUNTER — Other Ambulatory Visit (INDEPENDENT_AMBULATORY_CARE_PROVIDER_SITE_OTHER): Payer: Medicare Other

## 2014-02-24 VITALS — BP 130/78 | HR 65 | Temp 98.7°F | Wt 202.8 lb

## 2014-02-24 DIAGNOSIS — R7309 Other abnormal glucose: Secondary | ICD-10-CM

## 2014-02-24 DIAGNOSIS — Z23 Encounter for immunization: Secondary | ICD-10-CM

## 2014-02-24 DIAGNOSIS — Z0000 Encounter for general adult medical examination without abnormal findings: Secondary | ICD-10-CM

## 2014-02-24 DIAGNOSIS — R739 Hyperglycemia, unspecified: Secondary | ICD-10-CM

## 2014-02-24 LAB — HEMOGLOBIN A1C: Hgb A1c MFr Bld: 5.3 % (ref 4.6–6.5)

## 2014-02-24 NOTE — Progress Notes (Addendum)
Subjective:    Ethan Robinson is a 79 y.o. male who presents for Medicare Annual/Subsequent preventive examination.   Preventive Screening-Counseling & Management  Tobacco History  Smoking status  . Former Smoker -- 2.00 packs/day  . Types: Cigarettes  . Quit date: 05/29/1972  Smokeless tobacco  . Never Used    Comment: smoked 1957-1974, up to 2 ppd  occ alcohol    Problems Prior to Visit 1.   Current Problems (verified) Patient Active Problem List   Diagnosis Date Noted  . Gallstones 01/31/2013  . Back pain, thoracic 01/15/2013  . Right lumbar radiculopathy 01/15/2013  . OA (osteoarthritis) of knee 08/07/2011  . Unspecified adverse effect of unspecified drug, medicinal and biological substance 06/13/2011  . VENTRAL HERNIA 01/11/2010  . RHEUMATOID ARTHRITIS 01/11/2010  . PAIN IN JOINT, MULTIPLE SITES 06/22/2009  . HYPERTENSION, UNSPECIFIED 05/04/2009  . PEPTIC ULCER DISEASE 04/29/2009  . ARTHRALGIA 04/29/2009  . DEPRESSION 07/13/2008  . COUGH, CHRONIC 04/15/2008  . DYSPHAGIA PHARYNGEAL PHASE 04/15/2008  . TREMOR 12/19/2007  . HYPERGLYCEMIA, FASTING 12/19/2007  . HYPERLIPIDEMIA 07/22/2007  . ANEMIA-NOS 07/22/2007  . CORONARY ARTERY DISEASE 07/22/2007  . ALLERGIC RHINITIS 07/22/2007  . ASTHMA 07/22/2007  . GERD 07/22/2007  . DEGENERATIVE JOINT DISEASE 07/22/2007  . BENIGN PROSTATIC HYPERTROPHY, HX OF 07/22/2007    Medications Prior to Visit Current Outpatient Prescriptions on File Prior to Visit  Medication Sig Dispense Refill  . aspirin 325 MG tablet Take 325 mg by mouth daily.      . budesonide-formoterol (SYMBICORT) 160-4.5 MCG/ACT inhaler Inhale 2 puffs into the lungs 2 (two) times daily as needed (allergies).      . Calcium Carbonate-Vitamin D (CALCIUM 600 + D PO) Take by mouth daily.      . Coenzyme Q10 (CO Q 10 PO) Take 1 tablet by mouth daily.      . Cyanocobalamin (VITAMIN B 12 PO) Take 1 tablet by mouth daily.      . folic acid (FOLVITE) 1 MG  tablet Take 1 mg by mouth daily.      . Ginkgo Biloba 200 MG CAPS Take by mouth daily.      Marland Kitchen HUMIRA PEN 40 MG/0.8ML injection 40 mg every 14 (fourteen) days.       . hydroxychloroquine (PLAQUENIL) 200 MG tablet Take 200 mg by mouth 2 (two) times daily.      . methotrexate 25 MG/ML injection 25 mg once a week.       . metoprolol succinate (TOPROL-XL) 25 MG 24 hr tablet Take 1 tablet (25 mg total) by mouth daily.  90 tablet  1  . Multiple Vitamins-Minerals (MULTIVITAMIN WITH MINERALS) tablet Take 1 tablet by mouth daily.      . Omega-3 Fatty Acids (FISH OIL TRIPLE STRENGTH) 1400 MG CAPS Take by mouth daily.      Marland Kitchen PARoxetine (PAXIL) 20 MG tablet TAKE ONE TABLET BY MOUTH ONCE DAILY  30 tablet  2  . Polyethyl Glycol-Propyl Glycol (SYSTANE) 0.4-0.3 % SOLN Apply 1 drop to eye 2 (two) times daily.      . pregabalin (LYRICA) 75 MG capsule Take 1 capsule (75 mg total) by mouth 2 (two) times daily.  28 capsule  0  . primidone (MYSOLINE) 50 MG tablet TAKE TWO TABLETS BY MOUTH TWICE DAILY AT  10AM  AND  6PM  360 tablet  0  . rosuvastatin (CRESTOR) 20 MG tablet Take 1 tablet (20 mg total) by mouth at bedtime.  90 tablet  0  .  sulfaSALAzine (AZULFIDINE) 500 MG tablet Take 1,000 mg by mouth 2 (two) times daily.      . tamsulosin (FLOMAX) 0.4 MG CAPS capsule TAKE ONE CAPSULE BY MOUTH ONCE DAILY  90 capsule  2  . [DISCONTINUED] Calcium Carbonate (CALCIUM 600 PO) Take 600 mg by mouth daily.        No current facility-administered medications on file prior to visit.    Current Medications (verified) Current Outpatient Prescriptions  Medication Sig Dispense Refill  . aspirin 325 MG tablet Take 325 mg by mouth daily.      . budesonide-formoterol (SYMBICORT) 160-4.5 MCG/ACT inhaler Inhale 2 puffs into the lungs 2 (two) times daily as needed (allergies).      . Calcium Carbonate-Vitamin D (CALCIUM 600 + D PO) Take by mouth daily.      . Coenzyme Q10 (CO Q 10 PO) Take 1 tablet by mouth daily.      .  Cyanocobalamin (VITAMIN B 12 PO) Take 1 tablet by mouth daily.      . folic acid (FOLVITE) 1 MG tablet Take 1 mg by mouth daily.      . Ginkgo Biloba 200 MG CAPS Take by mouth daily.      Marland Kitchen HUMIRA PEN 40 MG/0.8ML injection 40 mg every 14 (fourteen) days.       . hydroxychloroquine (PLAQUENIL) 200 MG tablet Take 200 mg by mouth 2 (two) times daily.      . methotrexate 25 MG/ML injection 25 mg once a week.       . metoprolol succinate (TOPROL-XL) 25 MG 24 hr tablet Take 1 tablet (25 mg total) by mouth daily.  90 tablet  1  . Multiple Vitamins-Minerals (MULTIVITAMIN WITH MINERALS) tablet Take 1 tablet by mouth daily.      . Omega-3 Fatty Acids (FISH OIL TRIPLE STRENGTH) 1400 MG CAPS Take by mouth daily.      Marland Kitchen PARoxetine (PAXIL) 20 MG tablet TAKE ONE TABLET BY MOUTH ONCE DAILY  30 tablet  2  . Polyethyl Glycol-Propyl Glycol (SYSTANE) 0.4-0.3 % SOLN Apply 1 drop to eye 2 (two) times daily.      . pregabalin (LYRICA) 75 MG capsule Take 1 capsule (75 mg total) by mouth 2 (two) times daily.  28 capsule  0  . primidone (MYSOLINE) 50 MG tablet TAKE TWO TABLETS BY MOUTH TWICE DAILY AT  10AM  AND  6PM  360 tablet  0  . rosuvastatin (CRESTOR) 20 MG tablet Take 1 tablet (20 mg total) by mouth at bedtime.  90 tablet  0  . sulfaSALAzine (AZULFIDINE) 500 MG tablet Take 1,000 mg by mouth 2 (two) times daily.      . tamsulosin (FLOMAX) 0.4 MG CAPS capsule TAKE ONE CAPSULE BY MOUTH ONCE DAILY  90 capsule  2  . predniSONE (DELTASONE) 5 MG tablet       . [DISCONTINUED] Calcium Carbonate (CALCIUM 600 PO) Take 600 mg by mouth daily.        No current facility-administered medications for this visit.     Allergies (verified) Sulfonamide derivatives; Atorvastatin; Pravastatin sodium; and Simvastatin   PAST HISTORY  Family History Family History  Problem Relation Age of Onset  . Cancer Father     esophagus  . Stroke Mother     in mid 56s  . Arthritis Mother   . Diabetes Brother   . Heart attack Paternal  Grandmother     ? in 90s  . Diabetes Maternal Aunt   . Prostate cancer  Maternal Uncle   . Cancer Maternal Uncle   . Prostate cancer Cousin   . Cancer Cousin   . Liver cancer      Paternal family history  . Leukemia      Paternal family history  . Cancer Cousin     Social History History  Substance Use Topics  . Smoking status: Former Smoker -- 2.00 packs/day    Types: Cigarettes    Quit date: 05/29/1972  . Smokeless tobacco: Never Used     Comment: smoked 1957-1974, up to 2 ppd  occ alcohol  . Alcohol Use: 0.6 oz/week    1 Cans of beer per week     Comment: beer rarely    Are there smokers in your home (other than you)?  Yes  Risk Factors Current exercise habits: The patient does not participate in regular exercise at present.  Dietary issues discussed:  Eats out a lot.   Cardiac risk factors: advanced age (older than 70 for men, 29 for women), dyslipidemia, hypertension, male gender, sedentary lifestyle and smoking/ tobacco exposure.  Depression Screen (Note: if answer to either of the following is "Yes", a more complete depression screening is indicated)   Q1: Over the past two weeks, have you felt down, depressed or hopeless? Yes  Q2: Over the past two weeks, have you felt little interest or pleasure in doing things? Yes  Have you lost interest or pleasure in daily life? No  Do you often feel hopeless? No  Do you cry easily over simple problems? No  Activities of Daily Living In your present state of health, do you have any difficulty performing the following activities?:  Driving? No Managing money?  No Feeding yourself? No Getting from bed to chair? No Climbing a flight of stairs? Yes Preparing food and eating?: No Bathing or showering? No Getting dressed: No Getting to the toilet? No Using the toilet:No Moving around from place to place: No In the past year have you fallen or had a near fall?:Yes   Are you sexually active?  No  Do you have more than  one partner?  No  Hearing Difficulties: No Do you often ask people to speak up or repeat themselves? No Do you experience ringing or noises in your ears? Yes Do you have difficulty understanding soft or whispered voices? No   Do you feel that you have a problem with memory? No  Do you often misplace items? No  Do you feel safe at home?  Yes  Cognitive Testing  Alert? Yes  Normal Appearance?Yes  Oriented to person? Yes  Place? Yes   Time? Yes  Recall of three objects?  Yes  Can perform simple calculations? Yes  Displays appropriate judgment?Yes  Can read the correct time from a watch face?Yes   Advanced Directives have been discussed with the patient? Yes   List the Names of Other Physician/Practitioners you currently use: 1.  Patient Care Team: Pecola Lawless, MD as PCP - General 2. Dr. Despina Hick  Indicate any recent Medical Services you may have received from other than [facility] providers in the past year (date may be approximate).  Immunization History  Administered Date(s) Administered  . Influenza Whole 03/22/2005  . Influenza-Unspecified 03/12/2013  . Pneumococcal Polysaccharide-23 05/29/2008  . Td 12/19/2007  . Zoster 06/11/2009    Screening Tests Health Maintenance  Topic Date Due  . Influenza Vaccine  12/27/2013  . Tetanus/tdap  12/18/2017  . Colonoscopy  04/19/2018  . Pneumococcal Polysaccharide Vaccine  Age 47 And Over  Completed  . Zostavax  Completed    All answers were reviewed with the patient and necessary referrals were made:  Sallyanne Havers, NP   02/24/2014   History reviewed: allergies, current medications, past family history, past medical history, past social history, past surgical history and problem list  Review of Systems not indicated    Objective:     Vision by Snellen chart: right eye:20/20, left VOH:[id number] had catact surgery bialterally. Recommend follow up with Opthalmologist Blood pressure 130/78, pulse 65, temperature 98.7 F  (37.1 C), temperature source Oral, weight 202 lb 12 oz (91.967 kg), SpO2 95.00%. Body mass index is 28.67 kg/(m^2).  No exam performed today, not indicated.     Assessment:     *Patient presents for yearly preventative medicine examination. Medicare questionnaire was completed  All immunizations and health maintenance protocols were reviewed with the patient and needed orders were placed.  Appropriate screening laboratory values were ordered for the patient including screening of hyperlipidemia, renal function and hepatic function. If indicated by BPH, a PSA was ordered.  Medication reconciliation,  past medical history, social history, problem list and allergies were reviewed in detail with the patient  Goals were established with regard to weight loss, exercise, and  diet in compliance with medications  End of life planning was discussed.      Plan:     During the course of the visit the patient was educated and counseled about appropriate screening and preventive services including:    Influenza vaccine  Prostate cancer screening  Colorectal cancer screening  Diabetes screening  Diet review for nutrition referral? Yes ____  Not Indicated _x___   Patient Instructions (the written plan) was given to the patient.  Medicare Attestation I have personally reviewed: The patient's medical and social history Their use of alcohol, tobacco or illicit drugs Their current medications and supplements The patient's functional ability including ADLs,fall risks, home safety risks, cognitive, and hearing and visual impairment Diet and physical activities Evidence for depression or mood disorders  The patient's weight, height, BMI, and visual acuity have been recorded in the chart.  I have made referrals, counseling, and provided education to the patient based on review of the above and I have provided the patient with a written personalized care plan for preventive services.      Influenza vaccine given today Recommend Dr. audiologist for hearing evaluation.  Patient declines presently.  Patient will follow up with Opthalmolgist. Dr. Scarlette Ar Patient to follow low fat, carbohydrate diet.  Patient to get exercise as tolerated.  Follow up with Dr. Alwyn Ren in Jan 2015. Call clinic with question or concerns Check HbA1c today. PhQ-9 score 3 minimal depression.  Patient currently taking Paxil.  Patient declines counseling.    Purcell Mouton A, NP   02/24/2014

## 2014-02-24 NOTE — Addendum Note (Signed)
Addended by: Scharlene Gloss B on: 02/24/2014 02:18 PM   Modules accepted: Orders

## 2014-02-24 NOTE — Progress Notes (Signed)
Pre visit review using our clinic review tool, if applicable. No additional management support is needed unless otherwise documented below in the visit note. 

## 2014-02-24 NOTE — Patient Instructions (Signed)
Get Hba1c drawn now.  Follow up with your Primary Care Physician in Jan 2016 Maintain Low fat, carbohydrate diet.  Recommend seeing Audiologist for hearing evaluation  Follow up with Dr. Scarlette Ar for eye exam  Exercise as tolerated.  Call clinic with questions or concernsDiabetes and Exercise Exercising regularly is important. It is not just about losing weight. It has many health benefits, such as:  Improving your overall fitness, flexibility, and endurance.  Increasing your bone density.  Helping with weight control.  Decreasing your body fat.  Increasing your muscle strength.  Reducing stress and tension.  Improving your overall health. People with diabetes who exercise gain additional benefits because exercise:  Reduces appetite.  Improves the body's use of blood sugar (glucose).  Helps lower or control blood glucose.  Decreases blood pressure.  Helps control blood lipids (such as cholesterol and triglycerides).  Improves the body's use of the hormone insulin by:  Increasing the body's insulin sensitivity.  Reducing the body's insulin needs.  Decreases the risk for heart disease because exercising:  Lowers cholesterol and triglycerides levels.  Increases the levels of good cholesterol (such as high-density lipoproteins [HDL]) in the body.  Lowers blood glucose levels. YOUR ACTIVITY PLAN  Choose an activity that you enjoy and set realistic goals. Your health care provider or diabetes educator can help you make an activity plan that works for you. Exercise regularly as directed by your health care provider. This includes:  Performing resistance training twice a week such as push-ups, sit-ups, lifting weights, or using resistance bands.  Performing 150 minutes of cardio exercises each week such as walking, running, or playing sports.  Staying active and spending no more than 90 minutes at one time being inactive. Even short bursts of exercise are good for you.  Three 10-minute sessions spread throughout the day are just as beneficial as a single 30-minute session. Some exercise ideas include:  Taking the dog for a walk.  Taking the stairs instead of the elevator.  Dancing to your favorite song.  Doing an exercise video.  Doing your favorite exercise with a friend. RECOMMENDATIONS FOR EXERCISING WITH TYPE 1 OR TYPE 2 DIABETES   Check your blood glucose before exercising. If blood glucose levels are greater than 240 mg/dL, check for urine ketones. Do not exercise if ketones are present.  Avoid injecting insulin into areas of the body that are going to be exercised. For example, avoid injecting insulin into:  The arms when playing tennis.  The legs when jogging.  Keep a record of:  Food intake before and after you exercise.  Expected peak times of insulin action.  Blood glucose levels before and after you exercise.  The type and amount of exercise you have done.  Review your records with your health care provider. Your health care provider will help you to develop guidelines for adjusting food intake and insulin amounts before and after exercising.  If you take insulin or oral hypoglycemic agents, watch for signs and symptoms of hypoglycemia. They include:  Dizziness.  Shaking.  Sweating.  Chills.  Confusion.  Drink plenty of water while you exercise to prevent dehydration or heat stroke. Body water is lost during exercise and must be replaced.  Talk to your health care provider before starting an exercise program to make sure it is safe for you. Remember, almost any type of activity is better than none. Document Released: 08/05/2003 Document Revised: 09/29/2013 Document Reviewed: 10/22/2012 [facility] Patient Information 2015 [place], [place]. This information is not  intended to replace advice given to you by your health care provider. Make sure you discuss any questions you have with your health care provider.

## 2014-02-26 ENCOUNTER — Other Ambulatory Visit: Payer: Self-pay | Admitting: Neurology

## 2014-02-26 ENCOUNTER — Other Ambulatory Visit: Payer: Self-pay | Admitting: Internal Medicine

## 2014-02-26 NOTE — Telephone Encounter (Signed)
OK X 3 mo 

## 2014-03-25 ENCOUNTER — Ambulatory Visit (INDEPENDENT_AMBULATORY_CARE_PROVIDER_SITE_OTHER): Payer: Medicare Other | Admitting: Nurse Practitioner

## 2014-03-25 ENCOUNTER — Encounter: Payer: Self-pay | Admitting: Nurse Practitioner

## 2014-03-25 VITALS — BP 114/56 | HR 58 | Ht 70.0 in | Wt 204.0 lb

## 2014-03-25 DIAGNOSIS — G250 Essential tremor: Secondary | ICD-10-CM

## 2014-03-25 MED ORDER — PRIMIDONE 50 MG PO TABS: 100.0000 mg | ORAL_TABLET | Freq: Two times a day (BID) | ORAL | Status: DC

## 2014-03-25 NOTE — Patient Instructions (Signed)
Continue primidone 50 mg 2 tablets at 10 AM and 6 PM, will refill for 3 months/3 refills  Followup yearly and when necessary

## 2014-03-25 NOTE — Progress Notes (Signed)
[facility]  PATIENT: Ethan Robinson DOB: [birth year]   REASON FOR VISIT: Follow-up for essential tremor  HISTORY OF PRESENT ILLNESS:Ethan Robinson, 79 year old white male returns for followup. He has history of bilateral hand tremor for greater than 12 years.  He has been a patient of our clinic Since 2010, bilateral hands, and head tremor, no family history of tremor, retired now but wants to continue medication because of difficulty writing, using keyboard,  knee replacement, mild gait difficulty. He is currently on primidone 100 mg twice daily with good benefit. He denies any side effects of the medication. He has past medical history of depression,anxiety, hyperlipidemia, coronary artery disease.  No gait difficulty, no limitation on his daily activity, performs all activities of daily living without difficulty. Has a history of ringing in his ears and hearing loss due to working in a noisy environment for years. He gets very little exercise. He returns for reevaluation    REVIEW OF SYSTEMS: Full 14 system review of systems performed and notable only for those listed, all others are neg:  Constitutional: N/A  Cardiovascular: N/A  Ear/Nose/Throat: N/A  Skin: N/A  Eyes: N/A  Respiratory: N/A  Gastroitestinal: Difficulty urinating Hematology/Lymphatic: N/A  Endocrine: N/A Musculoskeletal: Joint pain Allergy/Immunology: N/A  Neurological: Tremors  Psychiatric: Depression Sleep : NA   ALLERGIES: Allergies  Allergen Reactions  . Sulfonamide Derivatives Rash    Medi Alert bracelet  is recommended  . Atorvastatin Other (See Comments)    Cramping    . Pravastatin Sodium Other (See Comments)    Cramp   . Simvastatin Other (See Comments)    Cramps     HOME MEDICATIONS: Outpatient Prescriptions Prior to Visit  Medication Sig Dispense Refill  . aspirin 325 MG tablet Take 325 mg by mouth daily.      . Calcium Carbonate-Vitamin D (CALCIUM 600 + D PO)  Take by mouth daily.      . Coenzyme Q10 (CO Q 10 PO) Take 1 tablet by mouth daily.      . Cyanocobalamin (VITAMIN B 12 PO) Take 1 tablet by mouth daily.      . folic acid (FOLVITE) 1 MG tablet Take 1 mg by mouth daily.      . Ginkgo Biloba 200 MG CAPS Take by mouth daily.      . hydroxychloroquine (PLAQUENIL) 200 MG tablet Take 200 mg by mouth 2 (two) times daily.      . methotrexate 25 MG/ML injection 25 mg once a week.       . metoprolol succinate (TOPROL-XL) 25 MG 24 hr tablet Take 1 tablet (25 mg total) by mouth daily.  90 tablet  1  . Multiple Vitamins-Minerals (MULTIVITAMIN WITH MINERALS) tablet Take 1 tablet by mouth daily.      . Omega-3 Fatty Acids (FISH OIL TRIPLE STRENGTH) 1400 MG CAPS Take by mouth daily.      Marland Kitchen PARoxetine (PAXIL) 20 MG tablet TAKE ONE TABLET BY MOUTH ONCE DAILY  90 tablet  0  . Polyethyl Glycol-Propyl Glycol (SYSTANE) 0.4-0.3 % SOLN Apply 1 drop to eye 2 (two) times daily.      . predniSONE (DELTASONE) 5 MG tablet       . pregabalin (LYRICA) 75 MG capsule Take 1 capsule (75 mg total) by mouth 2 (two) times daily.  28 capsule  0  . primidone (MYSOLINE) 50 MG tablet TAKE TWO TABLETS BY MOUTH TWICE DAILY AT  10AM  AND  6PM  360 tablet  0  . rosuvastatin (CRESTOR) 20 MG tablet Take 1 tablet (20 mg total) by mouth at bedtime.  90 tablet  0  . sulfaSALAzine (AZULFIDINE) 500 MG tablet Take 1,000 mg by mouth 2 (two) times daily.      . tamsulosin (FLOMAX) 0.4 MG CAPS capsule TAKE ONE CAPSULE BY MOUTH ONCE DAILY  90 capsule  2  . budesonide-formoterol (SYMBICORT) 160-4.5 MCG/ACT inhaler Inhale 2 puffs into the lungs 2 (two) times daily as needed (allergies).      Marland Kitchen HUMIRA PEN 40 MG/0.8ML injection 40 mg every 14 (fourteen) days.        No facility-administered medications prior to visit.    PAST MEDICAL HISTORY: Past Medical History  Diagnosis Date  . Diverticulosis 2004    Onset GI; due 2014  . Other and unspecified hyperlipidemia   . CAD (coronary artery  disease)     Dr Gala Romney  . Unspecified essential hypertension   . Anemia 2010    H/H  12.9/38  . GERD (gastroesophageal reflux disease)     NO MEDS  . RA (rheumatoid arthritis)     Dr Dierdre Forth  AND OA BOTH KNEES AND HANDS  . Movement disorder     PAST SURGICAL HISTORY: Past Surgical History  Procedure Laterality Date  . Tonsillectomy    . Angioplasty  1997    3 stents  . Knee surgery      x 2  . Carpal tunnel release      bilateral  . Trigger finger release      multiple  . Back surgery  1995    for ruptured disc LS spine  . Shoulder surgery  2005  . Colonoscopy  2004    Diverticulosis  . Cataract extraction, bilateral  2011  . Total knee arthroplasty  08/07/2011    Procedure: TOTAL KNEE ARTHROPLASTY;  Surgeon: Loanne Drilling, MD;  Location: [facility];  Service: Orthopedics;  Laterality: Right;  . Cholecystectomy N/A 02/13/2013    Procedure: LAPAROSCOPIC CHOLECYSTECTOMY WITH INTRAOPERATIVE CHOLANGIOGRAM;  Surgeon: Clovis Pu. Cornett, MD;  Location: [facility] OR;  Service: General;  Laterality: N/A;  . Post op transfusion  9/14    for anemia    FAMILY HISTORY: Family History  Problem Relation Age of Onset  . Cancer Father     esophagus  . Stroke Mother     in mid 29s  . Arthritis Mother   . Diabetes Brother   . Heart attack Paternal Grandmother     ? in 33s  . Diabetes Maternal Aunt   . Prostate cancer Maternal Uncle   . Cancer Maternal Uncle   . Prostate cancer Cousin   . Cancer Cousin   . Liver cancer      Paternal family history  . Leukemia      Paternal family history  . Cancer Cousin     SOCIAL HISTORY: History   Social History  . Marital Status: Married    Spouse Name: Steward Drone    Number of Children: 1  . Years of Education: 12   Occupational History  .      retired   Social History Main Topics  . Smoking status: Former Smoker -- 2.00 packs/day    Types: Cigarettes    Quit date: 05/29/1972  . Smokeless tobacco: Never Used     Comment: smoked  1957-1974, up to 2 ppd  occ alcohol  . Alcohol Use: 0.6 oz/week    1 Cans of beer per week  Comment: beer rarely  . Drug Use: No  . Sexual Activity: Not on file   Other Topics Concern  . Not on file   Social History Narrative   Patient lives with his wife(Brenda).   Patient has 1 child.   Patient has a high school education.   Patient is right-handed.   Patient is retired.     PHYSICAL EXAM  Filed Vitals:   03/25/14 1514  BP: 114/56  Pulse: 58  Height:  (1.778 m)  Weight: 204 lb (92.534 kg)   Body mass index is 29.27 kg/(m^2). Generalized: Well developed, in no acute distress  Head: normocephalic and atraumatic,. Oropharynx benign  Neck: Supple, no carotid bruits  Musculoskeletal: No deformity  Neurological examination  Mentation: Alert oriented to time, place, history taking. Follows all commands speech and language fluent  Cranial nerve II-XII: Pupils were equal round reactive to light extraocular movements were full, visual field were full on confrontational test. Facial sensation and strength were normal. hearing was intact to finger rubbing bilaterally. Uvula tongue midline. head turning and shoulder shrug and were normal and symmetric.Tongue protrusion into cheek strength was normal.  Motor: normal bulk and tone, full strength in the BUE, BLE, mild postural tremor, no rigidity no weakness  Coordination: finger-nose-finger, heel-to-shin bilaterally, no dysmetria  Reflexes: Brachioradialis 2/2, biceps 2/2, triceps 2/2, patellar 2/2, Achilles 1/1, plantar responses were flexor bilaterally.  Gait and Station: Rising up from seated position without assistance, wide base stance, moderate stride, good arm swing, smooth turning, unable to perform tiptoe, and heel walking without difficulty. Tandem gait unsteady. No assistive device   DIAGNOSTIC DATA (LABS, IMAGING, TESTING) - I reviewed patient records, labs, notes, testing and imaging myself where available.  Lab  Results  Component Value Date   WBC 7.5 06/12/2013   HGB 12.7* 06/12/2013   HCT 38.1* 06/12/2013   MCV 96.3 06/12/2013   PLT 225.0 06/12/2013      Component Value Date/Time   NA 136 06/12/2013 1059   K 4.2 06/12/2013 1059   CL 103 06/12/2013 1059   CO2 28 06/12/2013 1059   GLUCOSE 100* 06/12/2013 1059   BUN 15 06/12/2013 1059   CREATININE 1.0 06/12/2013 1059   CALCIUM 9.3 06/12/2013 1059   PROT 7.2 06/12/2013 1059   ALBUMIN 4.1 06/12/2013 1059   AST 24 06/12/2013 1059   ALT 18 06/12/2013 1059   ALKPHOS 35* 06/12/2013 1059   BILITOT 0.5 06/12/2013 1059   GFRNONAA 82* 02/20/2013 1840   GFRAA >90 02/20/2013 1840    Lab Results  Component Value Date   HGBA1C 5.3 02/24/2014     ASSESSMENT AND PLAN  79 y.o. year old male  has a past medical history of  essential tremor, there is no rigidity no loss of sense of smell and no weakness. His symptoms are well controlled on primidone  Continue primidone 50 mg 2 tablets at 10 AM and 6 PM, will refill for 3 months/3 refills  Followup yearly and when necessary Nilda Riggs, GNP, [facility], APRN

## 2014-04-02 ENCOUNTER — Other Ambulatory Visit: Payer: Self-pay | Admitting: Urology

## 2014-04-07 ENCOUNTER — Encounter (HOSPITAL_COMMUNITY)
Admission: RE | Admit: 2014-04-07 | Discharge: 2014-04-07 | Disposition: A | Discharge status: Home / Self Care | Payer: Medicare Other | Source: Ambulatory Visit | Attending: Urology | Admitting: Urology

## 2014-04-07 ENCOUNTER — Ambulatory Visit (HOSPITAL_COMMUNITY)
Admission: RE | Admit: 2014-04-07 | Discharge: 2014-04-07 | Disposition: A | Discharge status: Home / Self Care | Payer: Medicare Other | Source: Ambulatory Visit | Attending: Anesthesiology | Admitting: Anesthesiology

## 2014-04-07 ENCOUNTER — Encounter (HOSPITAL_COMMUNITY): Payer: Self-pay

## 2014-04-07 DIAGNOSIS — J984 Other disorders of lung: Secondary | ICD-10-CM | POA: Diagnosis not present

## 2014-04-07 DIAGNOSIS — Z01818 Encounter for other preprocedural examination: Secondary | ICD-10-CM | POA: Insufficient documentation

## 2014-04-07 DIAGNOSIS — I10 Essential (primary) hypertension: Secondary | ICD-10-CM

## 2014-04-07 DIAGNOSIS — J9811 Atelectasis: Secondary | ICD-10-CM | POA: Insufficient documentation

## 2014-04-07 HISTORY — DX: Depression, unspecified: F32.A

## 2014-04-07 HISTORY — DX: Major depressive disorder, single episode, unspecified: F32.9

## 2014-04-07 HISTORY — DX: Unspecified asthma, uncomplicated: J45.909

## 2014-04-07 LAB — CBC
HCT: 34 % — ABNORMAL LOW (ref 39.0–52.0)
HEMOGLOBIN: 11.6 g/dL — AB (ref 13.0–17.0)
MCH: 33.5 pg (ref 26.0–34.0)
MCHC: 34.1 g/dL (ref 30.0–36.0)
MCV: 98.3 fL (ref 78.0–100.0)
Platelets: 328 10*3/uL (ref 150–400)
RBC: 3.46 MIL/uL — ABNORMAL LOW (ref 4.22–5.81)
RDW: 12.9 % (ref 11.5–15.5)
WBC: 8.9 10*3/uL (ref 4.0–10.5)

## 2014-04-07 LAB — BASIC METABOLIC PANEL
Anion gap: 10 (ref 5–15)
BUN: 13 mg/dL (ref 6–23)
CALCIUM: 9.6 mg/dL (ref 8.4–10.5)
CO2: 24 meq/L (ref 19–32)
Chloride: 98 mEq/L (ref 96–112)
Creatinine, Ser: 0.9 mg/dL (ref 0.50–1.35)
GFR calc Af Amer: >90 mL/min (ref >90)
GFR calc non Af Amer: 80 mL/min — ABNORMAL LOW (ref >90)
GLUCOSE: 96 mg/dL (ref 70–99)
Potassium: 4.8 mEq/L (ref 3.7–5.3)
Sodium: 132 mEq/L — ABNORMAL LOW (ref 137–147)

## 2014-04-07 NOTE — Progress Notes (Signed)
EKG in epic 06/11/2013

## 2014-04-07 NOTE — Progress Notes (Signed)
CBC results per PAT visit on 04/07/2014 sent to Dr Vernie Ammons

## 2014-04-07 NOTE — Progress Notes (Signed)
Neurology note per epic 03/25/2014

## 2014-04-07 NOTE — Patient Instructions (Addendum)
[name]  04/07/2014   Your procedure is scheduled on:    Friday November 13,2015      Report to [facility] Main Entrance and follow signs to  Short Stay Center arrive at 0930 AM.   Call this number if you have problems the morning of surgery [phone] or Presurgical Testing [phone].   Remember:  Do not eat food or drink liquids :After Midnight.  For Living Will and/or Health Care Power Attorney Forms: please provide copy for your medical record, may bring AM of surgery (forms should be already notarized-we do not provide this service).     Take these medicines the morning of surgery with A SIP OF WATER: Metoprolol;Paroxetine(Paxel);Systane eye drops if needed;Lyrica;Primidone                               You may not have any metal on your body including hair pins and piercings  Do not wear jewelry lotions, powders, or deodorant.  Men may shave face and neck.               Do not bring valuables to the hospital. [facility] IS NOT RESPONSIBLE FOR VALUABLES.  Contacts, dentures or bridgework may not be worn into surgery.  Leave suitcase in the car. After surgery it may be brought to your room.  For patients admitted to the hospital, checkout time is 11:00 AM the day of discharge.   ________________________________________________________________________  [facility] - Preparing for Surgery Before surgery, you can play an important role.  Because skin is not sterile, your skin needs to be as free of germs as possible.  You can reduce the number of germs on your skin by washing with CHG (chlorahexidine gluconate) soap before surgery.  CHG is an antiseptic cleaner which kills germs and bonds with the skin to continue killing germs even after washing. Please DO NOT use if you have an allergy to CHG or antibacterial soaps.  If your skin becomes reddened/irritated stop using the CHG and inform your nurse when you arrive at Short Stay. Do not shave (including legs  and underarms) for at least 48 hours prior to the first CHG shower.  You may shave your face/neck. Please follow these instructions carefully:  1.  Shower with CHG Soap the night before surgery and the  morning of Surgery.  2.  If you choose to wash your hair, wash your hair first as usual with your  normal  shampoo.  3.  After you shampoo, rinse your hair and body thoroughly to remove the  shampoo.                           4.  Use CHG as you would any other liquid soap.  You can apply chg directly  to the skin and wash                       Gently with a scrungie or clean washcloth.  5.  Apply the CHG Soap to your body ONLY FROM THE NECK DOWN.   Do not use on face/ open                           Wound or open sores. Avoid contact with eyes, ears mouth and genitals (private parts).  Wash face,  Genitals (private parts) with your normal soap.             6.  Wash thoroughly, paying special attention to the area where your surgery  will be performed.  7.  Thoroughly rinse your body with warm water from the neck down.  8.  DO NOT shower/wash with your normal soap after using and rinsing off  the CHG Soap.                9.  Pat yourself dry with a clean towel.            10.  Wear clean pajamas.            11.  Place clean sheets on your bed the night of your first shower and do not  sleep with pets. Day of Surgery : Do not apply any lotions/deodorants the morning of surgery.  Please wear clean clothes to the hospital/surgery center.  FAILURE TO FOLLOW THESE INSTRUCTIONS MAY RESULT IN THE CANCELLATION OF YOUR SURGERY PATIENT SIGNATURE_________________________________  NURSE SIGNATURE__________________________________  ________________________________________________________________________

## 2014-04-10 ENCOUNTER — Encounter (HOSPITAL_COMMUNITY): Payer: Self-pay | Admitting: *Deleted

## 2014-04-10 ENCOUNTER — Encounter (HOSPITAL_COMMUNITY)
Admission: RE | Disposition: A | Discharge status: Home / Self Care | Payer: Self-pay | Source: Ambulatory Visit | Attending: Urology

## 2014-04-10 ENCOUNTER — Ambulatory Visit (HOSPITAL_COMMUNITY): Payer: Medicare Other | Admitting: Anesthesiology

## 2014-04-10 ENCOUNTER — Ambulatory Visit (HOSPITAL_COMMUNITY)
Admission: RE | Admit: 2014-04-10 | Discharge: 2014-04-11 | Disposition: A | Discharge status: Home / Self Care | Payer: Medicare Other | Source: Ambulatory Visit | Attending: Urology | Admitting: Urology

## 2014-04-10 DIAGNOSIS — Z79899 Other long term (current) drug therapy: Secondary | ICD-10-CM | POA: Diagnosis not present

## 2014-04-10 DIAGNOSIS — I2510 Atherosclerotic heart disease of native coronary artery without angina pectoris: Secondary | ICD-10-CM | POA: Diagnosis not present

## 2014-04-10 DIAGNOSIS — E780 Pure hypercholesterolemia: Secondary | ICD-10-CM | POA: Insufficient documentation

## 2014-04-10 DIAGNOSIS — Z87891 Personal history of nicotine dependence: Secondary | ICD-10-CM | POA: Insufficient documentation

## 2014-04-10 DIAGNOSIS — K219 Gastro-esophageal reflux disease without esophagitis: Secondary | ICD-10-CM | POA: Insufficient documentation

## 2014-04-10 DIAGNOSIS — J45909 Unspecified asthma, uncomplicated: Secondary | ICD-10-CM | POA: Insufficient documentation

## 2014-04-10 DIAGNOSIS — I10 Essential (primary) hypertension: Secondary | ICD-10-CM | POA: Insufficient documentation

## 2014-04-10 DIAGNOSIS — N138 Other obstructive and reflux uropathy: Secondary | ICD-10-CM | POA: Diagnosis present

## 2014-04-10 DIAGNOSIS — Z7982 Long term (current) use of aspirin: Secondary | ICD-10-CM | POA: Insufficient documentation

## 2014-04-10 DIAGNOSIS — M1990 Unspecified osteoarthritis, unspecified site: Secondary | ICD-10-CM | POA: Insufficient documentation

## 2014-04-10 DIAGNOSIS — N401 Enlarged prostate with lower urinary tract symptoms: Secondary | ICD-10-CM | POA: Diagnosis not present

## 2014-04-10 DIAGNOSIS — D649 Anemia, unspecified: Secondary | ICD-10-CM | POA: Insufficient documentation

## 2014-04-10 DIAGNOSIS — F329 Major depressive disorder, single episode, unspecified: Secondary | ICD-10-CM | POA: Diagnosis not present

## 2014-04-10 HISTORY — PX: TRANSURETHRAL RESECTION OF PROSTATE: SHX73

## 2014-04-10 SURGERY — TRANSURETHRAL RESECTION OF THE PROSTATE WITH GYRUS INSTRUMENTS
Anesthesia: General

## 2014-04-10 MED ORDER — HYDROCODONE-ACETAMINOPHEN 5-325 MG PO TABS
1.0000 | ORAL_TABLET | ORAL | Status: DC | PRN
  Administered 2014-04-10: 1 via ORAL
  Filled 2014-04-10: qty 1

## 2014-04-10 MED ORDER — CIPROFLOXACIN IN D5W 400 MG/200ML IV SOLN
INTRAVENOUS | Status: AC
  Filled 2014-04-10: qty 200

## 2014-04-10 MED ORDER — ONDANSETRON HCL 4 MG/2ML IJ SOLN
INTRAMUSCULAR | Status: AC
  Filled 2014-04-10: qty 2

## 2014-04-10 MED ORDER — SODIUM CHLORIDE 0.9 % IV SOLN
INTRAVENOUS | Status: DC
  Administered 2014-04-10 – 2014-04-11 (×2): via INTRAVENOUS

## 2014-04-10 MED ORDER — HYDROCODONE-ACETAMINOPHEN 10-325 MG PO TABS: 1.0000 | ORAL_TABLET | ORAL | Status: DC | PRN

## 2014-04-10 MED ORDER — ONDANSETRON HCL 4 MG/2ML IJ SOLN: 4.0000 mg | INTRAMUSCULAR | Status: DC | PRN

## 2014-04-10 MED ORDER — LIDOCAINE HCL (CARDIAC) 20 MG/ML IV SOLN
INTRAVENOUS | Status: DC | PRN
  Administered 2014-04-10: 100 mg via INTRAVENOUS

## 2014-04-10 MED ORDER — FENTANYL CITRATE 0.05 MG/ML IJ SOLN
INTRAMUSCULAR | Status: DC | PRN
  Administered 2014-04-10: 25 ug via INTRAVENOUS

## 2014-04-10 MED ORDER — BACITRACIN-NEOMYCIN-POLYMYXIN 400-5-5000 EX OINT: 1.0000 "application " | TOPICAL_OINTMENT | Freq: Three times a day (TID) | CUTANEOUS | Status: DC | PRN

## 2014-04-10 MED ORDER — BELLADONNA ALKALOIDS-OPIUM 16.2-60 MG RE SUPP
1.0000 | Freq: Four times a day (QID) | RECTAL | Status: DC | PRN
Start: 2014-04-10 — End: 2014-04-11
  Administered 2014-04-10 (×2): 1 via RECTAL
  Filled 2014-04-10 (×2): qty 1

## 2014-04-10 MED ORDER — ACETAMINOPHEN 325 MG PO TABS
650.0000 mg | ORAL_TABLET | ORAL | Status: DC | PRN
Start: 2014-04-10 — End: 2014-04-11

## 2014-04-10 MED ORDER — PROPOFOL 10 MG/ML IV BOLUS
INTRAVENOUS | Status: DC | PRN
  Administered 2014-04-10: 200 mg via INTRAVENOUS

## 2014-04-10 MED ORDER — FENTANYL CITRATE 0.05 MG/ML IJ SOLN
INTRAMUSCULAR | Status: AC
  Filled 2014-04-10: qty 2

## 2014-04-10 MED ORDER — LACTATED RINGERS IV SOLN: INTRAVENOUS | Status: DC

## 2014-04-10 MED ORDER — FENTANYL CITRATE 0.05 MG/ML IJ SOLN
25.0000 ug | INTRAMUSCULAR | Status: DC | PRN
  Administered 2014-04-10 (×3): 12.5 ug via INTRAVENOUS

## 2014-04-10 MED ORDER — PROPOFOL 10 MG/ML IV BOLUS
INTRAVENOUS | Status: AC
  Filled 2014-04-10: qty 20

## 2014-04-10 MED ORDER — ONDANSETRON HCL 4 MG/2ML IJ SOLN
INTRAMUSCULAR | Status: DC | PRN
  Administered 2014-04-10: 4 mg via INTRAVENOUS

## 2014-04-10 MED ORDER — CIPROFLOXACIN IN D5W 200 MG/100ML IV SOLN
200.0000 mg | Freq: Two times a day (BID) | INTRAVENOUS | Status: AC
  Administered 2014-04-10: 200 mg via INTRAVENOUS
  Filled 2014-04-10: qty 100

## 2014-04-10 MED ORDER — LIDOCAINE HCL (CARDIAC) 20 MG/ML IV SOLN
INTRAVENOUS | Status: AC
  Filled 2014-04-10: qty 5

## 2014-04-10 MED ORDER — LACTATED RINGERS IV SOLN
INTRAVENOUS | Status: DC | PRN
  Administered 2014-04-10: 11:00:00 via INTRAVENOUS

## 2014-04-10 MED ORDER — DEXAMETHASONE SODIUM PHOSPHATE 10 MG/ML IJ SOLN
INTRAMUSCULAR | Status: DC | PRN
  Administered 2014-04-10: 10 mg via INTRAVENOUS

## 2014-04-10 MED ORDER — CIPROFLOXACIN IN D5W 400 MG/200ML IV SOLN
400.0000 mg | INTRAVENOUS | Status: AC
  Administered 2014-04-10: 400 mg via INTRAVENOUS

## 2014-04-10 MED ORDER — SODIUM CHLORIDE 0.9 % IR SOLN
Status: DC | PRN
  Administered 2014-04-10: 9000 mL via INTRAVESICAL

## 2014-04-10 MED ORDER — DEXAMETHASONE SODIUM PHOSPHATE 10 MG/ML IJ SOLN
INTRAMUSCULAR | Status: AC
  Filled 2014-04-10: qty 1

## 2014-04-10 MED ORDER — PHENAZOPYRIDINE HCL 200 MG PO TABS: 200.0000 mg | ORAL_TABLET | Freq: Three times a day (TID) | ORAL | Status: DC | PRN

## 2014-04-10 SURGICAL SUPPLY — 22 items
BAG URINE DRAINAGE (UROLOGICAL SUPPLIES) ×2 IMPLANT
BAG URO CATCHER STRL LF (DRAPE) ×2 IMPLANT
CATH FOLEY 3WAY 30CC 24FR (CATHETERS) ×2
CATH URTH STD 24FR FL 3W 2 (CATHETERS) ×1 IMPLANT
DRAPE CAMERA CLOSED 9X96 (DRAPES) ×2 IMPLANT
ELECT BUTTON HF 24-28F 2 30DE (ELECTRODE) IMPLANT
ELECT HF RESECT BIPO 24F 45 ND (CUTTING LOOP) IMPLANT
ELECT LOOP MED HF 24F 12D (CUTTING LOOP) ×2 IMPLANT
ELECT LOOP MED HF 24F 12D CBL (CLIP) ×2 IMPLANT
ELECT REM PT RETURN 9FT ADLT (ELECTROSURGICAL) ×2
ELECT RESECT VAPORIZE 12D CBL (ELECTRODE) IMPLANT
ELECTRODE REM PT RTRN 9FT ADLT (ELECTROSURGICAL) ×1 IMPLANT
EVACUATOR MICROVAS BLADDER (UROLOGICAL SUPPLIES) ×2 IMPLANT
GLOVE BIOGEL M 8.0 STRL (GLOVE) ×2 IMPLANT
GOWN STRL REUS W/TWL XL LVL3 (GOWN DISPOSABLE) ×2 IMPLANT
IV NS IRRIG 3000ML ARTHROMATIC (IV SOLUTION) IMPLANT
KIT ASPIRATION TUBING (SET/KITS/TRAYS/PACK) ×2 IMPLANT
MANIFOLD NEPTUNE II (INSTRUMENTS) ×2 IMPLANT
NS IRRIG 1000ML POUR BTL (IV SOLUTION) IMPLANT
PACK CYSTO (CUSTOM PROCEDURE TRAY) ×2 IMPLANT
SYR 30ML LL (SYRINGE) IMPLANT
TUBING CONNECTING 10 (TUBING) ×2 IMPLANT

## 2014-04-10 NOTE — Plan of Care (Signed)
Problem: Phase I Progression Outcomes Goal: Pain controlled with appropriate interventions Outcome: Completed/Met Date Met:  04/10/14 Goal: Bedrest x 6 hrs for spinal anesthesia Outcome: Not Applicable Date Met:  49/44/96 Goal: No urinary obstruction (foley to traction) Outcome: Completed/Met Date Met:  04/10/14 Goal: Initial discharge plan identified Outcome: Completed/Met Date Met:  04/10/14 Goal: Tubes/drains patent Outcome: Completed/Met Date Met:  04/10/14 Goal: Continue bladder and/or hand irrigation Outcome: Completed/Met Date Met:  04/10/14 Goal: Hemodynamically stable Outcome: Completed/Met Date Met:  04/10/14 Goal: Tolerating diet Outcome: Completed/Met Date Met:  04/10/14

## 2014-04-10 NOTE — Transfer of Care (Signed)
Immediate Anesthesia Transfer of Care Note  Patient: Ethan Robinson  Procedure(s) Performed: Procedure(s): TRANSURETHRAL RESECTION OF THE PROSTATE WITH GYRUS INSTRUMENTS (N/A)  Patient Location: PACU  Anesthesia Type:General  Level of Consciousness: sedated  Airway & Oxygen Therapy: Patient Spontanous Breathing and Patient connected to face mask oxygen  Post-op Assessment: Report given to PACU RN and Post -op Vital signs reviewed and stable  Post vital signs: Reviewed and stable  Complications: No apparent anesthesia complications

## 2014-04-10 NOTE — Op Note (Signed)
PATIENT:  Ethan Robinson  PRE-OPERATIVE DIAGNOSIS: BPH with outlet obstruction  POST-OPERATIVE DIAGNOSIS: Same  PROCEDURE: TURP  SURGEON:  Garnett Farm  INDICATION: [name] is a 79 year old male with obstructive voiding symptoms who has been managed previously on maximum medical management. He had progression of his symptoms and underwent urodynamics which revealed obstruction and he therefore has elected to proceed with transurethral resection of his prostate.  ANESTHESIA:  General  EBL:  Minimal  DRAINS: 22 [place], three-way Foley catheter  LOCAL MEDICATIONS USED:  None  SPECIMEN:  Prostate chips to pathology.  Description of procedure: After informed consent the patient was taken to the operating room and placed on the table in a supine position. General anesthesia was then administered. Once fully anesthetized the patient was moved to the dorsal lithotomy position and the genitalia were sterilely prepped and draped in standard fashion. An official timeout was then performed.  The 98 French resectoscope with 12 lens and visual obturator were passed under direct vision down the urethra which was noted be entirely normal. The prostatic urethra revealed a fairly short prostatic urethra with not a lot of lateral lobe hypertrophy but a very high, obstructing median lobe/high bladder neck. Upon entering the bladder I noted one plus trabeculation. The right and left ureteral orifice were identified and noted to be of normal position and configuration. There were well away from the bladder neck. The bladder was fully and systematically inspected and noted be free of any tumors, stones or inflammatory lesions.  I began resecting at the 6:00 position and resected from the level of the bladder neck back to the level of the veru. I then resected the left lobe of the prostate down of the surgical capsule progressing from the inferior aspect of the prostate anteriorly. I then resected  the right lobe of the prostate in a similar fashion. Bleeding points were cauterized as they were encountered. I resected away apical tissue with care being taken to remain proximal to the veru at all times. The prostatic chips were then evacuated from the bladder using a Microvasive evacuator. Reinspection revealed a widely resected prostatic fossa, no prostatic chips remaining within the bladder and no significant bleeding.I therefore removed the resectoscope and inserted the Foley catheter and filled the balloon with 30 mL of sterile water. This was placed on mild traction and connected to closed system drainage as well as continuous bladder irrigation and the irrigant returned completely clear. The patient was therefore awakened and taken to recovery room in stable and satisfactory condition. He tolerated procedure well with no intraoperative complications.   PLAN OF CARE: observe overnight with anticipated discharge in the a.m.  PATIENT DISPOSITION:  PACU - hemodynamically stable.

## 2014-04-10 NOTE — Progress Notes (Signed)
Patient has nasal congestion this am. Lungs clear. Denies productive cough

## 2014-04-10 NOTE — Anesthesia Postprocedure Evaluation (Signed)
  Anesthesia Post-op Note  Patient: Ethan Robinson  Procedure(s) Performed: Procedure(s) (LRB): TRANSURETHRAL RESECTION OF THE PROSTATE WITH GYRUS INSTRUMENTS (N/A)  Patient Location: PACU  Anesthesia Type: General  Level of Consciousness: awake and alert   Airway and Oxygen Therapy: Patient Spontanous Breathing  Post-op Pain: mild  Post-op Assessment: Post-op Vital signs reviewed, Patient's Cardiovascular Status Stable, Respiratory Function Stable, Patent Airway and No signs of Nausea or vomiting  Last Vitals:  Filed Vitals:   04/10/14 1317  BP: 142/83  Pulse: 60  Temp: 36.5 C  Resp: 18    Post-op Vital Signs: stable   Complications: No apparent anesthesia complications

## 2014-04-10 NOTE — H&P (Signed)
Ethan Robinson is a 79 year old male who returns having undergone urodynamics for further evaluation of urinary hesitancy.   History of Present Illness LUTS: He said many years ago he underwent cystoscopy but does not recall who performed the study. His symptoms consist of hesitancy which is fairly severe with intermittency and dribbling urinary stream which is much worse at night. He says during the day and seemed to be better. He was placed on tamsulosin in the past and did note some improvement but he said it seemed to have lost its effectiveness. He has passed a stone when he was in his 49s.     Interval history: No new urologic complaint and noted today.   Past Medical History Problems  1. History of arthritis (Z87.39) 2. History of cardiac disorder (Z86.79) 3. History of depression (Z86.59) 4. History of esophageal reflux (Z87.19) 5. History of gastric ulcer (Z87.19) 6. History of hypercholesterolemia (Z86.39) 7. History of Inguinal hernia, right (K40.90)  Surgical History Problems  1. History of Back Surgery 2. History of Hand Incision Tendon Sheath Of A Finger 3. History of Peripheral Nerve Block Wrist Median 4. History of Shoulder Surgery Left  Current Meds 1. Aspirin 325 MG Oral Tablet Delayed Release;  Therapy: (Recorded:29Sep2015) to Recorded 2. Calcium 600 MG Oral Tablet;  Therapy: (Recorded:29Sep2015) to Recorded 3. Co-Enzyme Q10 CAPS;  Therapy: (Recorded:29Sep2015) to Recorded 4. Crestor 20 MG Oral Tablet;  Therapy: (Recorded:29Sep2015) to Recorded 5. Fish Oil CAPS;  Therapy: (Recorded:29Sep2015) to Recorded 6. Folic Acid CAPS;  Therapy: (Recorded:29Sep2015) to Recorded 7. Ginkgo TABS;  Therapy: (Recorded:29Sep2015) to Recorded 8. Humira Pen 40 MG/0.8ML KIT;  Therapy: (Recorded:29Sep2015) to Recorded 9. Hydroxychloroquine Sulfate 200 MG Oral Tablet;  Therapy: (Recorded:29Sep2015) to Recorded 10. Lyrica 75 MG Oral Capsule;   Therapy: (Recorded:29Sep2015) to  Recorded 11. Methotrexate TABS;   Therapy: (Recorded:29Sep2015) to Recorded 12. Metoprolol Succinate ER 25 MG Oral Tablet Extended Release 24 Hour;   Therapy: (Recorded:29Sep2015) to Recorded 13. Multi Vitamin Mens Oral Tablet;   Therapy: (Recorded:29Sep2015) to Recorded 14. PARoxetine HCl - 20 MG Oral Tablet;   Therapy: (Recorded:29Sep2015) to Recorded 15. Primidone TABS;   Therapy: (Recorded:29Sep2015) to Recorded 16. SulfaSALAzine 500 MG Oral Tablet;   Therapy: (Recorded:29Sep2015) to Recorded 17. Symbicort 160-4.5 MCG/ACT Inhalation Aerosol;   Therapy: (Recorded:29Sep2015) to Recorded 18. Systane Ultra PF 0.4-0.3 % Ophthalmic Solution;   Therapy: (Recorded:29Sep2015) to Recorded 19. Tamsulosin HCl - 0.4 MG Oral Capsule;   Therapy: (Recorded:29Sep2015) to Recorded 20. Vitamin B12 TABS;   Therapy: (Recorded:29Sep2015) to Recorded  Allergies Medication  1. No Known Drug Allergies  Family History Problems  1. Family history of Alzheimer's disease (Z82.0) : Mother 2. Family history of cerebrovascular accident (Z82.3) : Mother 3. Family history of dementia (Z81.8) : Mother 4. Family history of prostate cancer (Z80.42) : Uncle 5. Family history of Throat cancer : Father  Social History Problems  1. Alcohol use (F10.99) 2. Caffeine use (F15.90) 3. Former smoker [phone]) 4. Married  Review of Systems Genitourinary, constitutional, skin, eye, otolaryngeal, hematologic/lymphatic, cardiovascular, pulmonary, endocrine, musculoskeletal, gastrointestinal, neurological and psychiatric system(s) were reviewed and pertinent findings if present are noted.  Genitourinary: urinary frequency, nocturia, difficulty starting the urinary stream, weak urinary stream, urinary stream starts and stops, incomplete emptying of bladder, post-void dribbling and erectile dysfunction.  Gastrointestinal: nausea, vomiting and diarrhea.  Constitutional: feeling tired (fatigue).  Integumentary: skin  rash/lesion and pruritus.  Eyes: blurred vision.  ENT: sore throat.  Hematologic/Lymphatic: a tendency to easily bruise.  Respiratory: cough.  Musculoskeletal: joint pain.  Neurological: dizziness.  Psychiatric: depression.    Vitals Height: 5 ft 10.5 in Weight: 200 lb  BMI Calculated: 28.29 BSA Calculated: 2.1 Blood Pressure: 127 / 79 Heart Rate: 57  Physical Exam Constitutional: Well nourished and well developed . No acute distress.  ENT:. The ears and nose are normal in appearance.  Neck: The appearance of the neck is normal and no neck mass is present.  Pulmonary: No respiratory distress and normal respiratory rhythm and effort.  Cardiovascular: Heart rate and rhythm are normal . No peripheral edema.  Abdomen: The abdomen is soft and nontender. No masses are palpated. No CVA tenderness.  A right inguinal hernia is present, which is reducible. No hepatosplenomegaly noted.  Rectal: Rectal exam demonstrates normal sphincter tone, no tenderness and no masses. The prostate has no nodularity and is not tender. The left seminal vesicle is nonpalpable. The right seminal vesicle is nonpalpable. The perineum is normal on inspection.  Genitourinary: Examination of the penis demonstrates no discharge, no masses, no lesions and a normal meatus. The scrotum is without lesions. The right epididymis is palpably normal and non-tender. The left epididymis is palpably normal and non-tender. The right testis is non-tender and without masses. The left testis is non-tender and without masses.  Lymphatics: The femoral and inguinal nodes are not enlarged or tender.  Skin: Normal skin turgor, no visible rash and no visible skin lesions.  Neuro/Psych:. Mood and affect are appropriate.     Urodynamics 03/24/14: study was performed to evaluate obstructive voiding symptoms.  Initial PVR: 40 cc   CMG: Maximum cystometric capacity was 360 cc. Hypersensitivity with first sensation of filling at 91 cc. Low  amplitude instability of 4-5 centimeters H2O.  Pressure-flow: Voluntary contraction with 355 cc voided, maximum flow rate was 8 cc/second and detrusor pressure at maximum flow of 38 cm H2O.  EMG: No significant abnormality.  Fluoroscopy: No significant abnormality with small diverticulum noted.    Impression: He appeared to have a slightly diminished functional capacity with some slight hypersensitivity and low amplitude instability which I do not think is of clinical significance. He did have fairly significant outlet obstruction with high voiding pressure.   Assessment  I went over the results of the urodynamic study. We discussed each of the findings of the study and the normal/anticipated results as well as the significance of the results of this study in relation to the reported subjective symptoms. He clearly has outlet obstruction and we therefore discussed the options. We discussed maximum medical management with an alpha-blocker and a 5 alpha reductase inhibitor. He is already on an alpha-blocker and I would switch into Rapaflo which might help with his voiding symptoms more than the Flomax that he is taking. He wanted to get off medication and we therefore discussed outlet reduction surgery. I went over transurethral resection of the prostate as a means of doing this. We also discussed other outlet reduction procedures. As far as a transurethral resection goes I went over the procedure with him in detail. We discussed the risks and complications, the probability of success, the need for an overnight stay and the anticipated postoperative course. He would like to proceed with a transurethral resection at this time.   Plan   1.  I have asked him to stop his daily aspirin.  2. He will be scheduled for TURP.

## 2014-04-10 NOTE — Anesthesia Preprocedure Evaluation (Signed)
Anesthesia Evaluation  Patient identified by MRN, date of birth, ID band Patient awake    Reviewed: Allergy & Precautions, H&P , NPO status , Patient's Chart, lab work & pertinent test results  Airway Mallampati: II  TM Distance: >3 FB Neck ROM: Full    Dental no notable dental hx.    Pulmonary asthma , former smoker,  breath sounds clear to auscultation  Pulmonary exam normal       Cardiovascular Exercise Tolerance: Good hypertension, Pt. on home beta blockers and Pt. on medications + CAD Rhythm:Regular Rate:Normal     Neuro/Psych PSYCHIATRIC DISORDERS Depression  Neuromuscular disease    GI/Hepatic Neg liver ROS, PUD, GERD-  ,  Endo/Other  negative endocrine ROS  Renal/GU negative Renal ROS  negative genitourinary   Musculoskeletal  (+) Arthritis -, Rheumatoid disorders,    Abdominal   Peds negative pediatric ROS (+)  Hematology  (+) anemia ,   Anesthesia Other Findings   Reproductive/Obstetrics negative OB ROS                             Anesthesia Physical Anesthesia Plan  ASA: III  Anesthesia Plan: General   Post-op Pain Management:    Induction: Intravenous  Airway Management Planned: LMA  Additional Equipment:   Intra-op Plan:   Post-operative Plan: Extubation in OR  Informed Consent: I have reviewed the patients History and Physical, chart, labs and discussed the procedure including the risks, benefits and alternatives for the proposed anesthesia with the patient or authorized representative who has indicated his/her understanding and acceptance.   Dental advisory given  Plan Discussed with: CRNA  Anesthesia Plan Comments:         Anesthesia Quick Evaluation

## 2014-04-10 NOTE — Discharge Instructions (Signed)

## 2014-04-11 DIAGNOSIS — N401 Enlarged prostate with lower urinary tract symptoms: Secondary | ICD-10-CM | POA: Diagnosis not present

## 2014-04-11 NOTE — Discharge Summary (Signed)
Date of admission: 04/10/2014  Date of discharge: 04/11/2014  Admission diagnosis: BPH  Discharge diagnosis: BPH  Secondary diagnoses: None  History and Physical: For full details, please see admission history and physical. Briefly, Ethan Robinson is a 79 y.o. year old patient with BPH, to OR on 11/13 for TURP.   Hospital Course: The patient underwent TURP on 04/10/2014. They tolerated the procedure well and recovered on the floor without complication. Their diet was advanced to regular. They were ambulatory. Their pain was controlled with PO pain meds. On day of discharge he was voiding well.  PVR = 29ml after >234ml void.  DC on POD#1 in stable condition.  Laboratory values: No results for input(s): HGB, HCT in the last 72 hours. No results for input(s): CREATININE in the last 72 hours.  Disposition: Home  Discharge medications:    Medication List    STOP taking these medications        tamsulosin 0.4 MG Caps capsule  Commonly known as:  FLOMAX      TAKE these medications        aspirin 325 MG tablet  Take 325 mg by mouth at bedtime.     CALCIUM 600 + D PO  Take 1 tablet by mouth at bedtime.     CO Q 10 PO  Take 1 tablet by mouth at bedtime.     FISH OIL TRIPLE STRENGTH 1400 MG Caps  Take 1 capsule by mouth daily.     folic acid 1 MG tablet  Commonly known as:  FOLVITE  Take 1 mg by mouth every morning.     Ginkgo Biloba 200 MG Caps  Take 1 capsule by mouth every morning.     HYDROcodone-acetaminophen 10-325 MG per tablet  Commonly known as:  NORCO  Take 1-2 tablets by mouth every 4 (four) hours as needed for moderate pain. Maximum dose per 24 hours - 8 pills     hydroxychloroquine 200 MG tablet  Commonly known as:  PLAQUENIL  Take 200 mg by mouth 2 (two) times daily.     KRILL OIL PO  Take 1 capsule by mouth at bedtime.     methotrexate 25 MG/ML injection  Inject 25 mg into the skin once a week.     metoprolol succinate 25 MG 24 hr tablet   Commonly known as:  TOPROL-XL  Take 1 tablet (25 mg total) by mouth daily.     multivitamin with minerals tablet  Take 1 tablet by mouth every morning.     PARoxetine 20 MG tablet  Commonly known as:  PAXIL  TAKE ONE TABLET BY MOUTH ONCE DAILY     phenazopyridine 200 MG tablet  Commonly known as:  PYRIDIUM  Take 1 tablet (200 mg total) by mouth 3 (three) times daily as needed for pain.     predniSONE 5 MG tablet  Commonly known as:  DELTASONE  Take 5 mg by mouth every morning.     pregabalin 75 MG capsule  Commonly known as:  LYRICA  Take 1 capsule (75 mg total) by mouth 2 (two) times daily.     primidone 50 MG tablet  Commonly known as:  MYSOLINE  Take 2 tablets (100 mg total) by mouth 2 (two) times daily.     rosuvastatin 20 MG tablet  Commonly known as:  CRESTOR  Take 1 tablet (20 mg total) by mouth at bedtime.     sulfaSALAzine 500 MG tablet  Commonly known as:  AZULFIDINE  Take 1,000 mg by mouth 2 (two) times daily.     SYSTANE 0.4-0.3 % Soln  Generic drug:  Polyethyl Glycol-Propyl Glycol  Apply 1 drop to eye 2 (two) times daily as needed (dry eyes).     VITAMIN B 12 PO  Take 1 tablet by mouth at bedtime.        Followup:      Follow-up Information    Follow up with Garnett Farm, MD On 04/28/2014.   Specialty:  Urology   Why:  at 11:30   Contact information:   [address] [place] [address] [phone]      Seen, examined and discussed with Dr. Craige Cotta and agree with assessment and plan.

## 2014-04-13 ENCOUNTER — Encounter (HOSPITAL_COMMUNITY): Payer: Self-pay | Admitting: Urology

## 2014-05-04 ENCOUNTER — Other Ambulatory Visit: Payer: Self-pay

## 2014-05-04 MED ORDER — ROSUVASTATIN CALCIUM 20 MG PO TABS: 20.0000 mg | ORAL_TABLET | Freq: Every day | ORAL | Status: DC

## 2014-05-27 ENCOUNTER — Other Ambulatory Visit: Payer: Self-pay | Admitting: Internal Medicine

## 2014-05-27 NOTE — Telephone Encounter (Signed)
OK X1 

## 2014-06-09 ENCOUNTER — Ambulatory Visit (INDEPENDENT_AMBULATORY_CARE_PROVIDER_SITE_OTHER): Payer: Medicare Other | Admitting: Internal Medicine

## 2014-06-09 ENCOUNTER — Encounter: Payer: Self-pay | Admitting: Internal Medicine

## 2014-06-09 VITALS — BP 132/76 | HR 62 | Temp 98.1°F | Ht 70.0 in | Wt 200.0 lb

## 2014-06-09 DIAGNOSIS — E785 Hyperlipidemia, unspecified: Secondary | ICD-10-CM | POA: Diagnosis not present

## 2014-06-09 DIAGNOSIS — I2510 Atherosclerotic heart disease of native coronary artery without angina pectoris: Secondary | ICD-10-CM

## 2014-06-09 DIAGNOSIS — I10 Essential (primary) hypertension: Secondary | ICD-10-CM | POA: Diagnosis not present

## 2014-06-09 DIAGNOSIS — M1712 Unilateral primary osteoarthritis, left knee: Secondary | ICD-10-CM | POA: Diagnosis not present

## 2014-06-09 DIAGNOSIS — D649 Anemia, unspecified: Secondary | ICD-10-CM

## 2014-06-09 NOTE — Patient Instructions (Signed)

## 2014-06-09 NOTE — Progress Notes (Signed)
Pre visit review using our clinic review tool, if applicable. No additional management support is needed unless otherwise documented below in the visit note. 

## 2014-06-09 NOTE — Progress Notes (Signed)
   Subjective:    Patient ID: Ethan Robinson, male    DOB: 25-Nov-1934, 80 y.o.   MRN: [id number]  HPI  He is scheduled for a left total knee replacement 08/03/14 by Dr. Despina Hick  He has end-stage degenerative joint disease which is significantly compromising his quality of life.  Despite prior steroid injections and rooster comb injections he continues to have pain which limits his walking. He is also able unable to climb steps. By limiting his activities dramatically he is able to control the pain with Tylenol.  He has not been monitoring his blood pressures. He does not add salt to any significant degree but does not restrict it. He is not exercising because of the knee pain. He is on modified heart healthy diet.He has not seen his Cardiologist since 06/27/11.  His last lipids were in April 2014. Last hepatic function was January 2015.    He has been compliant with his blood pressure medicines and statin  He describes occasional myalgias. He also describes some memory loss.  He has occasional  dizziness with lateral rotation of his trunk.  In 11/15 @ the time of his TURP he was anemic (11.6/31).   Review of Systems   Chest pain, palpitations, tachycardia, exertional dyspnea, paroxysmal nocturnal dyspnea, claudication or edema are absent.      Objective:   Physical Exam  Pertinent or positive findings include: There is a side-to-side head tremor which is subtle He has a beard and mustache A grade 1 systolic murmur is loudest at the base He has isolated DIP changes in his fingers Crepitus is present of the knees; this is more pronounced on the left Pedal pulses are decreased but intact  General appearance :adequately nourished; in no distress. Eyes: No conjunctival inflammation or scleral icterus is present. Oral exam: Dental hygiene is good. Lips and gums are healthy appearing.There is no oropharyngeal erythema or exudate noted.  Heart:  Normal rate and regular rhythm. S1  and S2 normal without gallop,click, rub or other extra sounds   Lungs:Chest clear to auscultation; no wheezes, rhonchi,rales ,or rubs present.No increased work of breathing.  Abdomen: bowel sounds normal, soft and non-tender without masses, organomegaly or hernias noted.  No guarding or rebound. No flank tenderness to percussion. Vascular : all pulses equal ; no bruits present. Skin:Warm & dry.  Intact without suspicious lesions or rashes ; no jaundice or tenting Lymphatic: No lymphadenopathy is noted about the head, neck, axilla.           Assessment & Plan:  #1 end-stage degenerative joint disease left knee with marked limitation of activities and impact on quality of life  #2 hypertension, adequately controlled  #3 dyslipidemia; status update needed #4 CAD , assessment not current #5 anemia perioperatively in 11/15  See orders

## 2014-06-15 ENCOUNTER — Telehealth: Payer: Self-pay

## 2014-06-15 NOTE — Telephone Encounter (Signed)
06/09/14 office note and clearance sheet has been faxed to [facility]

## 2014-06-30 ENCOUNTER — Other Ambulatory Visit: Payer: Self-pay | Admitting: Internal Medicine

## 2014-07-06 ENCOUNTER — Ambulatory Visit (HOSPITAL_COMMUNITY)
Admission: RE | Admit: 2014-07-06 | Discharge: 2014-07-06 | Disposition: A | Discharge status: Home / Self Care | Payer: Medicare Other | Source: Ambulatory Visit | Attending: Internal Medicine | Admitting: Internal Medicine

## 2014-07-06 VITALS — BP 116/60 | HR 61 | Wt 201.0 lb

## 2014-07-06 DIAGNOSIS — I2510 Atherosclerotic heart disease of native coronary artery without angina pectoris: Secondary | ICD-10-CM | POA: Diagnosis not present

## 2014-07-06 DIAGNOSIS — Z01810 Encounter for preprocedural cardiovascular examination: Secondary | ICD-10-CM | POA: Insufficient documentation

## 2014-07-06 DIAGNOSIS — R05 Cough: Secondary | ICD-10-CM

## 2014-07-06 DIAGNOSIS — I10 Essential (primary) hypertension: Secondary | ICD-10-CM | POA: Insufficient documentation

## 2014-07-06 DIAGNOSIS — Z01818 Encounter for other preprocedural examination: Secondary | ICD-10-CM | POA: Diagnosis not present

## 2014-07-06 DIAGNOSIS — R059 Cough, unspecified: Secondary | ICD-10-CM

## 2014-07-06 NOTE — Progress Notes (Signed)
Patient ID: Ethan Robinson, male   DOB: 1934-11-13, 80 y.o.   MRN: [id number]          1Date:  07/06/2014   Name:  Ethan Robinson       DOB:  30-Aug-1934 MRN:  [id number]  PCP:  Dr. Alwyn Ren Primary Cardiologist:  Dr. Arvilla Meres  Primary Electrophysiologist:  None    History of Present Illness: Ethan Robinson is a 80 y.o. male who presents for surgical clearance.  He needs a left  TKR with Dr. Lequita Halt in March.     He has a history of CAD, status post multiple stents to LCX in 1997 (details unavailable), hypertension, hyperlipidemia, borderline diabetes. Rheumatoid arthritis, and TURP 03/2014.   Last seen by Dr. Arvilla Meres in 12/10.  Myoview at that time: EF 66%, lateral ischemia (low risk study).   He returns for surgical clearance for knee replacement in March with Dr. Lequita Halt. Denies SOB/CP/PND/Orthopnea.  Unable to exercise due to arthritis however last week he was able to mow the grass and rake his yard for about 1 hour. Underwent TURP with general anesthesia in November without problem. Taking all medications.   Cardiac Review of Systems: {Y] = yes [ ]  = no  Chest Pain [    ]  Resting SOB [   ] Exertional SOB  [  ]  Orthopnea [  ]   Pedal Edema [   ]    Palpitations [  ] Syncope  [  ]   Presyncope [   ]  General Review of Systems: [Y] = yes [  ]=no Constitional: recent weight change [  ]; anorexia [  ]; fatigue [  ]; nausea [  ]; night sweats [  ]; fever [  ]; or chills [  ];                                                                     Eyes : blurred vision [  ]; diplopia [   ]; vision changes [  ];  Amaurosis fugax[  ]; Resp: cough [  y];  wheezing[  ];  hemoptysis[  ];  PND [  ];  GI:  gallstones[  ], vomiting[  ];  dysphagia[  ]; melena[  ];  hematochezia [  ]; heartburn[  ];   GU: kidney stones [  ]; hematuria[  ];   dysuria [  ];  nocturia[  ]; incontinence [  ];             Skin: rash, swelling[  ];, hair loss[  ];  peripheral edema[  ];  or itching[   ]; Musculosketetal: myalgias[  ];  joint swelling[y  ];  joint erythema[  ];  joint pain[ y ];  back pain[  ];  Heme/Lymph: bruising[  ];  bleeding[  ];  anemia[  ];  Neuro: TIA[  ];  headaches[  ];  stroke[  ];  vertigo[  ];  seizures[  ];   paresthesias[y  ];  difficulty walking[  ];  Psych:depression[  ]; anxiety[  ];  Endocrine: diabetes[  ];  thyroid dysfunction[  ];  Other:  Past Medical History  Diagnosis Date  . Diverticulosis 2004  [facility]; due 2014  . Other and unspecified hyperlipidemia   . CAD (coronary artery disease)     Dr Gala Romney  . Unspecified essential hypertension   . Anemia 2010    H/H  12.9/38  . GERD (gastroesophageal reflux disease)     NO MEDS  . RA (rheumatoid arthritis)     Dr Dierdre Forth  AND OA BOTH KNEES AND HANDS  . Movement disorder     essential tremors  . Asthma     hx of   . Depression     Current Outpatient Prescriptions  Medication Sig Dispense Refill  . aspirin 325 MG tablet Take 325 mg by mouth at bedtime.     . Calcium Carbonate-Vitamin D (CALCIUM 600 + D PO) Take 1 tablet by mouth at bedtime.     . Coenzyme Q10 (CO Q 10 PO) Take 1 tablet by mouth at bedtime.     . Cyanocobalamin (VITAMIN B 12 PO) Take 1 tablet by mouth at bedtime.     . folic acid (FOLVITE) 1 MG tablet Take 1 mg by mouth every morning.     . Ginkgo Biloba 200 MG CAPS Take 1 capsule by mouth every morning.     . hydroxychloroquine (PLAQUENIL) 200 MG tablet Take 200 mg by mouth 2 (two) times daily.    Marland Kitchen KRILL OIL PO Take 1 capsule by mouth at bedtime.    . methotrexate 25 MG/ML injection Inject 25 mg into the skin once a week.     . metoprolol succinate (TOPROL-XL) 25 MG 24 hr tablet TAKE ONE TABLET BY MOUTH ONCE DAILY 90 tablet 1  . Multiple Vitamins-Minerals (MULTIVITAMIN WITH MINERALS) tablet Take 1 tablet by mouth every morning.     . Omega-3 Fatty Acids (FISH OIL TRIPLE STRENGTH) 1400 MG CAPS Take 1 capsule by mouth daily.     Marland Kitchen PARoxetine (PAXIL) 20 MG tablet  TAKE ONE TABLET BY MOUTH ONCE DAILY 90 tablet 0  . Polyethyl Glycol-Propyl Glycol (SYSTANE) 0.4-0.3 % SOLN Apply 1 drop to eye 2 (two) times daily as needed (dry eyes).     . predniSONE (DELTASONE) 5 MG tablet Take 5 mg by mouth every morning.     . primidone (MYSOLINE) 50 MG tablet Take 2 tablets (100 mg total) by mouth 2 (two) times daily. 360 tablet 3  . rosuvastatin (CRESTOR) 20 MG tablet Take 1 tablet (20 mg total) by mouth at bedtime. 90 tablet 1  . sulfaSALAzine (AZULFIDINE) 500 MG tablet Take 1,000 mg by mouth 2 (two) times daily.    . [DISCONTINUED] Calcium Carbonate (CALCIUM 600 PO) Take 600 mg by mouth daily.      No current facility-administered medications for this encounter.    Allergies: Allergies  Allergen Reactions  . Sulfonamide Derivatives Rash    Medi Alert bracelet  is recommended  . Atorvastatin Other (See Comments)    Cramping    . Pravastatin Sodium Other (See Comments)    Cramp   . Simvastatin Other (See Comments)    Cramps     History  Substance Use Topics  . Smoking status: Former Smoker -- 2.00 packs/day    Types: Cigarettes    Quit date: 05/29/1972  . Smokeless tobacco: Never Used     Comment: smoked 1957-1974, up to 2 ppd  occ alcohol  . Alcohol Use: 0.6 oz/week    1 Cans of beer per week     Comment: beer rarely    History   Social History  .  Marital Status: Married    Spouse Name: Steward Drone    Number of Children: 1  . Years of Education: 12   Occupational History  .      retired   Social History Main Topics  . Smoking status: Former Smoker -- 2.00 packs/day    Types: Cigarettes    Quit date: 05/29/1972  . Smokeless tobacco: Never Used     Comment: smoked 1957-1974, up to 2 ppd  occ alcohol  . Alcohol Use: 0.6 oz/week    1 Cans of beer per week     Comment: beer rarely  . Drug Use: No  . Sexual Activity: Not on file   Other Topics Concern  . Not on file   Social History Narrative   Patient lives with his wife(Brenda).    Patient has 1 child.   Patient has a high school education.   Patient is right-handed.   Patient is retired.   Family History  Problem Relation Age of Onset  . Cancer Father     esophagus  . Stroke Mother     in mid 6s  . Arthritis Mother   . Diabetes Brother   . Heart attack Paternal Grandmother     ? in 42s  . Diabetes Maternal Aunt   . Prostate cancer Maternal Uncle   . Cancer Maternal Uncle   . Prostate cancer Cousin   . Cancer Cousin   . Liver cancer      Paternal family history  . Leukemia      Paternal family history  . Cancer Cousin      PHYSICAL EXAM: VS:  BP 116/60 mmHg  Pulse 61  Wt 201 lb (91.173 kg)  SpO2 98% Well nourished, well developed, in no acute distress HEENT: normal Neck: no JVD Endo: no thyromegaly Vasc: no carotid bruits Cardiac:  normal S1, S2; RRR; no murmur Lungs:  clear to auscultation bilaterally, no wheezing, rhonchi or rale. Diminished throughout Abd: soft, nontender, no hepatomegaly Ext: no edema Skin: warm and dry Neuro:  CNs 2-12 intact, no focal abnormalities noted  EKG:  Sinus Brady 54 bpm. LAFB. Anterior TWI (no change from previous)  ASSESSMENT AND PLAN:   1. CAD status post multiple stents to circa 1997 (details unavailable) No evidence of ischemia. Continue aspirin, bb, and statin.  2. HTN - stable. Continue current regimen.  3. Surgical Clearance. For LTKR in March 2016. Given functional capacity and ability to tolerate general anesthesia for TURP without ischemic symptoms, I feel that he is low-risk for cardiac complications for TKR. He can proceed without further testing. We did discuss the possibility of stress testing but felt it was unnecessary. Will get PFTs prior to surgery given lung exam and h/o smoking x 40 years.   Follow up in 12 months at [facility]. CLEGG,AMY NP-C  4:23 PM   Patient seen and examined with Tonye Becket, NP. We discussed all aspects of the encounter. I agree with the assessment and plan as stated  above. I have edited the note to reflect my thoughts. Ok to proceed with surgery. Will get PFTs.   Truman Hayward 5:32 PM

## 2014-07-06 NOTE — Patient Instructions (Signed)
Your physician has recommended that you have a pulmonary function test. Pulmonary Function Tests are a group of tests that measure how well air moves in and out of your lungs.  Your physician recommends that you schedule a follow-up appointment in: 12 months with [facility]

## 2014-07-07 ENCOUNTER — Ambulatory Visit: Payer: Self-pay | Admitting: Orthopedic Surgery

## 2014-07-07 NOTE — Addendum Note (Signed)
Encounter addended by: Deitra Mayo, CCT on: 07/07/2014  8:51 AM<BR>     Documentation filed: Charges VN

## 2014-07-07 NOTE — Progress Notes (Signed)
Preoperative surgical orders have been place into the Epic hospital system for Ethan Robinson on 07/07/2014, 11:27 AM  by Patrica Duel for surgery on 08-03-2014.  Preop Total Knee orders including Experal, IV Tylenol, and IV Decadron as long as there are no contraindications to the above medications. Avel Peace, PA-C

## 2014-07-10 ENCOUNTER — Ambulatory Visit (HOSPITAL_COMMUNITY)
Admission: RE | Admit: 2014-07-10 | Discharge: 2014-07-10 | Disposition: A | Discharge status: Home / Self Care | Payer: Medicare Other | Source: Ambulatory Visit | Attending: Internal Medicine | Admitting: Internal Medicine

## 2014-07-10 DIAGNOSIS — R05 Cough: Secondary | ICD-10-CM | POA: Insufficient documentation

## 2014-07-10 DIAGNOSIS — R059 Cough, unspecified: Secondary | ICD-10-CM

## 2014-07-10 LAB — PULMONARY FUNCTION TEST
DL/VA % PRED: 72 %
DL/VA: 3.32 ml/min/mmHg/L
DLCO unc % pred: 57 %
DLCO unc: 18.5 ml/min/mmHg
FEF 25-75 Post: 3.67 L/sec
FEF 25-75 Pre: 3.28 L/sec
FEF2575-%Change-Post: 11 %
FEF2575-%PRED-POST: 175 %
FEF2575-%Pred-Pre: 156 %
FEV1-%CHANGE-POST: 4 %
FEV1-%PRED-POST: 100 %
FEV1-%Pred-Pre: 95 %
FEV1-POST: 2.96 L
FEV1-Pre: 2.83 L
FEV1FVC-%CHANGE-POST: 2 %
FEV1FVC-%Pred-Pre: 112 %
FEV6-%Change-Post: 5 %
FEV6-%Pred-Post: 92 %
FEV6-%Pred-Pre: 87 %
FEV6-POST: 3.57 L
FEV6-PRE: 3.39 L
FEV6FVC-%CHANGE-POST: 1 %
FEV6FVC-%PRED-POST: 107 %
FEV6FVC-%PRED-PRE: 106 %
FVC-%Change-Post: 2 %
FVC-%PRED-PRE: 84 %
FVC-%Pred-Post: 86 %
FVC-PRE: 3.5 L
FVC-Post: 3.58 L
Post FEV1/FVC ratio: 83 %
Post FEV6/FVC ratio: 100 %
Pre FEV1/FVC ratio: 81 %
Pre FEV6/FVC Ratio: 99 %
RV % PRED: 79 %
RV: 2.07 L
TLC % pred: 84 %
TLC: 5.94 L

## 2014-07-10 MED ORDER — ALBUTEROL SULFATE (2.5 MG/3ML) 0.083% IN NEBU
2.5000 mg | INHALATION_SOLUTION | Freq: Once | RESPIRATORY_TRACT | Status: AC
  Administered 2014-07-10: 2.5 mg via RESPIRATORY_TRACT

## 2014-07-23 ENCOUNTER — Ambulatory Visit: Payer: Medicare Other | Admitting: Cardiovascular Disease

## 2014-07-27 ENCOUNTER — Encounter (HOSPITAL_COMMUNITY)
Admission: RE | Admit: 2014-07-27 | Discharge: 2014-07-27 | Disposition: A | Discharge status: Home / Self Care | Payer: Medicare Other | Source: Ambulatory Visit | Attending: Orthopedic Surgery | Admitting: Orthopedic Surgery

## 2014-07-27 ENCOUNTER — Encounter (HOSPITAL_COMMUNITY): Payer: Self-pay

## 2014-07-27 DIAGNOSIS — M179 Osteoarthritis of knee, unspecified: Secondary | ICD-10-CM | POA: Diagnosis not present

## 2014-07-27 DIAGNOSIS — Z01818 Encounter for other preprocedural examination: Secondary | ICD-10-CM | POA: Diagnosis not present

## 2014-07-27 LAB — CBC
HCT: 36 % — ABNORMAL LOW (ref 39.0–52.0)
Hemoglobin: 11.9 g/dL — ABNORMAL LOW (ref 13.0–17.0)
MCH: 33.5 pg (ref 26.0–34.0)
MCHC: 33.1 g/dL (ref 30.0–36.0)
MCV: 101.4 fL — ABNORMAL HIGH (ref 78.0–100.0)
PLATELETS: 256 10*3/uL (ref 150–400)
RBC: 3.55 MIL/uL — ABNORMAL LOW (ref 4.22–5.81)
RDW: 13.2 % (ref 11.5–15.5)
WBC: 7.9 10*3/uL (ref 4.0–10.5)

## 2014-07-27 LAB — PROTIME-INR
INR: 1.12 (ref 0.00–1.49)
Prothrombin Time: 14.6 seconds (ref 11.6–15.2)

## 2014-07-27 LAB — COMPREHENSIVE METABOLIC PANEL
ALBUMIN: 4.6 g/dL (ref 3.5–5.2)
ALT: 24 U/L (ref 0–53)
AST: 38 U/L — AB (ref 0–37)
Alkaline Phosphatase: 36 U/L — ABNORMAL LOW (ref 39–117)
Anion gap: 8 (ref 5–15)
BUN: 20 mg/dL (ref 6–23)
CALCIUM: 9.9 mg/dL (ref 8.4–10.5)
CHLORIDE: 103 mmol/L (ref 96–112)
CO2: 25 mmol/L (ref 19–32)
Creatinine, Ser: 0.91 mg/dL (ref 0.50–1.35)
GFR calc Af Amer: >90 mL/min (ref >90)
GFR, EST NON AFRICAN AMERICAN: 80 mL/min — AB (ref >90)
Glucose, Bld: 113 mg/dL — ABNORMAL HIGH (ref 70–99)
Potassium: 5.6 mmol/L — ABNORMAL HIGH (ref 3.5–5.1)
Sodium: 136 mmol/L (ref 135–145)
Total Bilirubin: 0.8 mg/dL (ref 0.3–1.2)
Total Protein: 7.6 g/dL (ref 6.0–8.3)

## 2014-07-27 LAB — URINALYSIS, ROUTINE W REFLEX MICROSCOPIC
BILIRUBIN URINE: NEGATIVE
GLUCOSE, UA: NEGATIVE mg/dL
Hgb urine dipstick: NEGATIVE
Ketones, ur: NEGATIVE mg/dL
Leukocytes, UA: NEGATIVE
NITRITE: NEGATIVE
PH: 6 (ref 5.0–8.0)
Protein, ur: NEGATIVE mg/dL
SPECIFIC GRAVITY, URINE: 1.018 (ref 1.005–1.030)
Urobilinogen, UA: 0.2 mg/dL (ref 0.0–1.0)

## 2014-07-27 LAB — SURGICAL PCR SCREEN
MRSA, PCR: NEGATIVE
STAPHYLOCOCCUS AUREUS: NEGATIVE

## 2014-07-27 LAB — APTT: aPTT: 28 seconds (ref 24–37)

## 2014-07-27 NOTE — Patient Instructions (Addendum)
[name]  07/27/2014   Your procedure is scheduled on:   -08-03-2014 MONDAY  Enter through [facility]  Entrance and follow signs to [facility]. Arrive at  0600      AM .  Call this number if you have problems the morning of surgery: [phone]  Or Presurgical Testing [phone].   For Living Will and/or Health Care Power Attorney Forms: please provide copy for your medical record,may bring AM of surgery(Forms should be already notarized -we do not provide this service).( No information preferred today).      Do not eat food/ or drink: After Midnight.      Take these medicines the morning of surgery with A SIP OF WATER: Metoprolol. Prednisone.    Do not wear jewelry, make-up or nail polish.  Do not wear deodorant, lotions, powders, or perfumes.   Do not shave legs and under arms- 48 hours(2 days) prior to first CHG shower.(Shaving face and neck okay.)  Do not bring valuables to the hospital.(Hospital is not responsible for lost valuables).  Contacts, dentures or removable bridgework, body piercing, hair pins may not be worn into surgery.  Leave suitcase in the car. After surgery it may be brought to your room.  For patients admitted to the hospital, checkout time is 11:00 AM the day of discharge.(Restricted visitors-Any Persons displaying flu-like symptoms or illness).    Patients discharged the day of surgery will not be allowed to drive home. Must have responsible person with you x 24 hours once discharged.  Name and phone number of your driver: spouse-Ethan Robinson [phone] h     Please read over the following fact sheets that you were given:  CHG(Chlorhexidine Gluconate 4% Surgical Soap) use, MRSA Information, Blood Transfusion fact sheet, Incentive Spirometry Instruction.  Remember : Type/Screen "Blue armbands" - may not be removed once applied(would result in being retested AM of surgery, if removed).         [facility] - Preparing for Surgery Before  surgery, you can play an important role.  Because skin is not sterile, your skin needs to be as free of germs as possible.  You can reduce the number of germs on your skin by washing with CHG (chlorahexidine gluconate) soap before surgery.  CHG is an antiseptic cleaner which kills germs and bonds with the skin to continue killing germs even after washing. Please DO NOT use if you have an allergy to CHG or antibacterial soaps.  If your skin becomes reddened/irritated stop using the CHG and inform your nurse when you arrive at Short Stay. Do not shave (including legs and underarms) for at least 48 hours prior to the first CHG shower.  You may shave your face/neck. Please follow these instructions carefully:  1.  Shower with CHG Soap the night before surgery and the  morning of Surgery.  2.  If you choose to wash your hair, wash your hair first as usual with your  normal  shampoo.  3.  After you shampoo, rinse your hair and body thoroughly to remove the  shampoo.                           4.  Use CHG as you would any other liquid soap.  You can apply chg directly  to the skin and wash  Gently with a scrungie or clean washcloth.  5.  Apply the CHG Soap to your body ONLY FROM THE NECK DOWN.   Do not use on face/ open                           Wound or open sores. Avoid contact with eyes, ears mouth and genitals (private parts).                       Wash face,  Genitals (private parts) with your normal soap.             6.  Wash thoroughly, paying special attention to the area where your surgery  will be performed.  7.  Thoroughly rinse your body with warm water from the neck down.  8.  DO NOT shower/wash with your normal soap after using and rinsing off  the CHG Soap.                9.  Pat yourself dry with a clean towel.            10.  Wear clean pajamas.            11.  Place clean sheets on your bed the night of your first shower and do not  sleep with pets. Day of Surgery  : Do not apply any lotions/deodorants the morning of surgery.  Please wear clean clothes to the hospital/surgery center.  FAILURE TO FOLLOW THESE INSTRUCTIONS MAY RESULT IN THE CANCELLATION OF YOUR SURGERY PATIENT SIGNATURE_________________________________  NURSE SIGNATURE__________________________________  ________________________________________________________________________   Adam Phenix  An incentive spirometer is a tool that can help keep your lungs clear and active. This tool measures how well you are filling your lungs with each breath. Taking long deep breaths may help reverse or decrease the chance of developing breathing (pulmonary) problems (especially infection) following:  A long period of time when you are unable to move or be active. BEFORE THE PROCEDURE   If the spirometer includes an indicator to show your best effort, your nurse or respiratory therapist will set it to a desired goal.  If possible, sit up straight or lean slightly forward. Try not to slouch.  Hold the incentive spirometer in an upright position. INSTRUCTIONS FOR USE   Sit on the edge of your bed if possible, or sit up as far as you can in bed or on a chair.  Hold the incentive spirometer in an upright position.  Breathe out normally.  Place the mouthpiece in your mouth and seal your lips tightly around it.  Breathe in slowly and as deeply as possible, raising the piston or the ball toward the top of the column.  Hold your breath for 3-5 seconds or for as long as possible. Allow the piston or ball to fall to the bottom of the column.  Remove the mouthpiece from your mouth and breathe out normally.  Rest for a few seconds and repeat Steps 1 through 7 at least 10 times every 1-2 hours when you are awake. Take your time and take a few normal breaths between deep breaths.  The spirometer may include an indicator to show your best effort. Use the indicator as a goal to work toward during  each repetition.  After each set of 10 deep breaths, practice coughing to be sure your lungs are clear. If you have an incision (the cut made at the time of  surgery), support your incision when coughing by placing a pillow or rolled up towels firmly against it. Once you are able to get out of bed, walk around indoors and cough well. You may stop using the incentive spirometer when instructed by your caregiver.  RISKS AND COMPLICATIONS  Take your time so you do not get dizzy or light-headed.  If you are in pain, you may need to take or ask for pain medication before doing incentive spirometry. It is harder to take a deep breath if you are having pain. AFTER USE  Rest and breathe slowly and easily.  It can be helpful to keep track of a log of your progress. Your caregiver can provide you with a simple table to help with this. If you are using the spirometer at home, follow these instructions: [place] IF:   You are having difficultly using the spirometer.  You have trouble using the spirometer as often as instructed.  Your pain medication is not giving enough relief while using the spirometer.  You develop fever of 100.5 F (38.1 C) or higher. SEEK IMMEDIATE MEDICAL CARE IF:   You cough up bloody sputum that had not been present before.  You develop fever of 102 F (38.9 C) or greater.  You develop worsening pain at or near the incision site. MAKE SURE YOU:   Understand these instructions.  Will watch your condition.  Will get help right away if you are not doing well or get worse. Document Released: 09/25/2006 Document Revised: 08/07/2011 Document Reviewed: 11/26/2006 Exit[employer] Patient Information 2014 [employer], [place].   ________________________________________________________________________  WHAT IS A BLOOD TRANSFUSION? Blood Transfusion Information  A transfusion is the replacement of blood or some of its parts. Blood is made up of multiple cells which  provide different functions.  Red blood cells carry oxygen and are used for blood loss replacement.  White blood cells fight against infection.  Platelets control bleeding.  Plasma helps clot blood.  Other blood products are available for specialized needs, such as hemophilia or other clotting disorders. BEFORE THE TRANSFUSION  Who gives blood for transfusions?   Healthy volunteers who are fully evaluated to make sure their blood is safe. This is blood bank blood. Transfusion therapy is the safest it has ever been in the practice of medicine. Before blood is taken from a donor, a complete history is taken to make sure that person has no history of diseases nor engages in risky social behavior (examples are intravenous drug use or sexual activity with multiple partners). The donor's travel history is screened to minimize risk of transmitting infections, such as malaria. The donated blood is tested for signs of infectious diseases, such as HIV and hepatitis. The blood is then tested to be sure it is compatible with you in order to minimize the chance of a transfusion reaction. If you or a relative donates blood, this is often done in anticipation of surgery and is not appropriate for emergency situations. It takes many days to process the donated blood. RISKS AND COMPLICATIONS Although transfusion therapy is very safe and saves many lives, the main dangers of transfusion include:   Getting an infectious disease.  Developing a transfusion reaction. This is an allergic reaction to something in the blood you were given. Every precaution is taken to prevent this. The decision to have a blood transfusion has been considered carefully by your caregiver before blood is given. Blood is not given unless the benefits outweigh the risks. AFTER THE TRANSFUSION  Right after receiving a blood transfusion, you will usually feel much better and more energetic. This is especially true if your red blood cells  have gotten low (anemic). The transfusion raises the level of the red blood cells which carry oxygen, and this usually causes an energy increase.  The nurse administering the transfusion will monitor you carefully for complications. HOME CARE INSTRUCTIONS  No special instructions are needed after a transfusion. You may find your energy is better. Speak with your caregiver about any limitations on activity for underlying diseases you may have. SEEK MEDICAL CARE IF:   Your condition is not improving after your transfusion.  You develop redness or irritation at the intravenous (IV) site. SEEK IMMEDIATE MEDICAL CARE IF:  Any of the following symptoms occur over the next 12 hours:  Shaking chills.  You have a temperature by mouth above 102 F (38.9 C), not controlled by medicine.  Chest, back, or muscle pain.  People around you feel you are not acting correctly or are confused.  Shortness of breath or difficulty breathing.  Dizziness and fainting.  You get a rash or develop hives.  You have a decrease in urine output.  Your urine turns a dark color or changes to pink, red, or brown. Any of the following symptoms occur over the next 10 days:  You have a temperature by mouth above 102 F (38.9 C), not controlled by medicine.  Shortness of breath.  Weakness after normal activity.  The white part of the eye turns yellow (jaundice).  You have a decrease in the amount of urine or are urinating less often.  Your urine turns a dark color or changes to pink, red, or brown. Document Released: 05/12/2000 Document Revised: 08/07/2011 Document Reviewed: 12/30/2007 [facility] Patient Information 2014 [place], [place].  _______________________________________________________________________

## 2014-07-27 NOTE — Progress Notes (Signed)
CMP done 07/27/2014 faxed via EPIC to Dr Lequita Halt.

## 2014-07-27 NOTE — Pre-Procedure Instructions (Signed)
07-27-14 EKG 2'16/ CXR 11'15 Epic. Clearance note 07-06-14 Epic(Dr. Bensihmon,cardiology)- Epic.

## 2014-07-28 ENCOUNTER — Ambulatory Visit: Payer: Self-pay | Admitting: Orthopedic Surgery

## 2014-07-28 NOTE — Progress Notes (Signed)
07-28-14 Faxed order to repeat BMP on arrival preop per Dr. Lequita Halt.

## 2014-07-29 NOTE — H&P (Signed)
TOTAL KNEE ADMISSION H&P  Patient is being admitted for left total knee arthroplasty.  Subjective:  Chief Complaint:left knee pain.  HPI: Ethan Robinson, 80 y.o. male, has a history of pain and functional disability in the left knee due to arthritis and has failed non-surgical conservative treatments for greater than 12 weeks to includeNSAID's and/or analgesics, corticosteriod injections, viscosupplementation injections and activity modification.  Onset of symptoms was gradual, starting 8 years ago with gradually worsening course since that time. The patient noted prior procedures on the knee to include  arthroscopy and menisectomy on the left knee(s).  Patient currently rates pain in the left knee(s) at 8 out of 10 with activity. Patient has night pain, worsening of pain with activity and weight bearing, pain that interferes with activities of daily living, pain with passive range of motion, crepitus and joint swelling.  Patient has evidence of periarticular osteophytes and joint space narrowing by imaging studies. There is no active infection.  Patient Active Problem List   Diagnosis Date Noted  . Pre-operative cardiovascular examination 07/06/2014  . BPH (benign prostatic hypertrophy) with urinary obstruction 04/10/2014  . Gallstones 01/31/2013  . Back pain, thoracic 01/15/2013  . Right lumbar radiculopathy 01/15/2013  . OA (osteoarthritis) of knee 08/07/2011  . Unspecified adverse effect of unspecified drug, medicinal and biological substance 06/13/2011  . VENTRAL HERNIA 01/11/2010  . RHEUMATOID ARTHRITIS 01/11/2010  . PAIN IN JOINT, MULTIPLE SITES 06/22/2009  . Essential hypertension 05/04/2009  . PEPTIC ULCER DISEASE 04/29/2009  . ARTHRALGIA 04/29/2009  . DEPRESSION 07/13/2008  . COUGH, CHRONIC 04/15/2008  . DYSPHAGIA PHARYNGEAL PHASE 04/15/2008  . Essential tremor 12/19/2007  . HYPERGLYCEMIA, FASTING 12/19/2007  . Hyperlipidemia 07/22/2007  . Anemia 07/22/2007  . Coronary  atherosclerosis 07/22/2007  . ALLERGIC RHINITIS 07/22/2007  . ASTHMA 07/22/2007  . GERD 07/22/2007  . DEGENERATIVE JOINT DISEASE 07/22/2007  . BENIGN PROSTATIC HYPERTROPHY, HX OF 07/22/2007   Past Medical History  Diagnosis Date  . Diverticulosis 2004    [facility]; due 2014  . Other and unspecified hyperlipidemia   . CAD (coronary artery disease)     Dr Gala Romney  . Unspecified essential hypertension   . Anemia 2010    H/H  12.9/38  . GERD (gastroesophageal reflux disease)     NO MEDS  . RA (rheumatoid arthritis)     Dr Dierdre Forth  AND OA BOTH KNEES AND HANDS  . Movement disorder     essential tremors  . Asthma     hx of   . Depression     Past Surgical History  Procedure Laterality Date  . Tonsillectomy    . Angioplasty  1997    3 stents  . Knee surgery      x 2  . Carpal tunnel release      bilateral  . Trigger finger release      multiple  . Back surgery  1995    for ruptured disc LS spine  . Shoulder surgery  2005  . Colonoscopy  2004    Diverticulosis  . Cataract extraction, bilateral  2011  . Total knee arthroplasty  08/07/2011    Procedure: TOTAL KNEE ARTHROPLASTY;  Surgeon: Loanne Drilling, MD;  Location: [facility]S;  Service: Orthopedics;  Laterality: Right;  . Cholecystectomy N/A 02/13/2013    Procedure: LAPAROSCOPIC CHOLECYSTECTOMY WITH INTRAOPERATIVE CHOLANGIOGRAM;  Surgeon: Clovis Pu. Cornett, MD;  Location: [facility] OR;  Service: General;  Laterality: N/A;  . Post op transfusion  9/14    for  anemia  . Transurethral resection of prostate N/A 04/10/2014    Procedure: TRANSURETHRAL RESECTION OF THE PROSTATE WITH GYRUS INSTRUMENTS;  Surgeon: Garnett Farm, MD;  Location: [facility];  Service: Urology;  Laterality: N/A;  . Cardiac catheterization      coronary stents x3- placed 20 yrs ago.     Current outpatient prescriptions:  .  acetaminophen (TYLENOL) 500 MG tablet, Take 1,000 mg by mouth every 6 (six) hours as needed for moderate pain or headache., Disp: , Rfl:  .   aspirin 325 MG tablet, Take 325 mg by mouth at bedtime. , Disp: , Rfl:  .  Calcium Carbonate-Vitamin D (CALCIUM 600 + D PO), Take 1 tablet by mouth at bedtime. , Disp: , Rfl:  .  Coenzyme Q10 (CO Q 10 PO), Take 1 tablet by mouth at bedtime. , Disp: , Rfl:  .  Cyanocobalamin (VITAMIN B 12 PO), Take 1 tablet by mouth at bedtime. , Disp: , Rfl:  .  folic acid (FOLVITE) 1 MG tablet, Take 1 mg by mouth every morning. , Disp: , Rfl:  .  Ginkgo Biloba 200 MG CAPS, Take 1 capsule by mouth every morning. , Disp: , Rfl:  .  hydroxychloroquine (PLAQUENIL) 200 MG tablet, Take 200 mg by mouth 2 (two) times daily., Disp: , Rfl:  .  KRILL OIL PO, Take 1 capsule by mouth at bedtime., Disp: , Rfl:  .  methotrexate 25 MG/ML injection, Inject 25 mg into the skin once a week. No set days, Disp: , Rfl:  .  metoprolol succinate (TOPROL-XL) 25 MG 24 hr tablet, TAKE ONE TABLET BY MOUTH ONCE DAILY, Disp: 90 tablet, Rfl: 1 .  Multiple Vitamins-Minerals (MULTIVITAMIN WITH MINERALS) tablet, Take 1 tablet by mouth every morning. , Disp: , Rfl:  .  PARoxetine (PAXIL) 20 MG tablet, TAKE ONE TABLET BY MOUTH ONCE DAILY, Disp: 90 tablet, Rfl: 0 .  Polyethyl Glycol-Propyl Glycol (SYSTANE) 0.4-0.3 % SOLN, Apply 1 drop to eye 2 (two) times daily as needed (dry eyes). , Disp: , Rfl:  .  predniSONE (DELTASONE) 5 MG tablet, Take 5 mg by mouth every morning. , Disp: , Rfl:  .  primidone (MYSOLINE) 50 MG tablet, Take 2 tablets (100 mg total) by mouth 2 (two) times daily., Disp: 360 tablet, Rfl: 3 .  rosuvastatin (CRESTOR) 20 MG tablet, Take 1 tablet (20 mg total) by mouth at bedtime., Disp: 90 tablet, Rfl: 1 .  sulfaSALAzine (AZULFIDINE) 500 MG tablet, Take 1,000 mg by mouth 2 (two) times daily., Disp: , Rfl:    Allergies  Allergen Reactions  . Sulfonamide Derivatives Rash    Medi Alert bracelet  is recommended  . Atorvastatin Other (See Comments)    Cramping    . Pravastatin Sodium Other (See Comments)    Cramp   .  Simvastatin Other (See Comments)    Cramps     History  Substance Use Topics  . Smoking status: Former Smoker -- 2.00 packs/day    Types: Cigarettes    Quit date: 05/29/1972  . Smokeless tobacco: Never Used     Comment: smoked 1957-1974, up to 2 ppd  occ alcohol  . Alcohol Use: 0.6 oz/week    1 Cans of beer per week     Comment: beer rarely    Family History  Problem Relation Age of Onset  . Cancer Father     esophagus  . Stroke Mother     in mid 63s  . Arthritis Mother   .  Diabetes Brother   . Heart attack Paternal Grandmother     ? in 74s  . Diabetes Maternal Aunt   . Prostate cancer Maternal Uncle   . Cancer Maternal Uncle   . Prostate cancer Cousin   . Cancer Cousin   . Liver cancer      Paternal family history  . Leukemia      Paternal family history  . Cancer Cousin      Review of Systems  Constitutional: Negative.   HENT: Negative.   Eyes: Negative.   Respiratory: Positive for cough and shortness of breath. Negative for hemoptysis, sputum production and wheezing.   Cardiovascular: Negative.   Gastrointestinal: Positive for heartburn.  Genitourinary: Negative.   Musculoskeletal: Positive for joint pain. Negative for myalgias, back pain, falls and neck pain.       Left knee pain  Skin: Negative.   Neurological: Positive for tremors. Negative for dizziness, tingling, sensory change, focal weakness, seizures and loss of consciousness.  Endo/Heme/Allergies: Negative.   Psychiatric/Behavioral: Negative.     Objective:  Physical Exam  Constitutional: He is oriented to person, place, and time. He appears well-developed. No distress.  Overweight  HENT:  Head: Normocephalic and atraumatic.  Right Ear: External ear normal.  Left Ear: External ear normal.  Nose: Nose normal.  Mouth/Throat: Oropharynx is clear and moist.  Eyes: Conjunctivae and EOM are normal.  Neck: Normal range of motion. Neck supple.  Cardiovascular: Normal rate, regular rhythm, normal  heart sounds and intact distal pulses.   No murmur heard. Respiratory: Effort normal and breath sounds normal. No respiratory distress. He has no wheezes.  GI: Soft. Bowel sounds are normal. He exhibits no distension. There is no tenderness.  Musculoskeletal:       Right hip: Normal.       Left hip: Normal.       Right knee: Normal.       Left knee: He exhibits decreased range of motion and swelling. He exhibits no effusion and no erythema. Tenderness found. Medial joint line and lateral joint line tenderness noted.       Right lower leg: He exhibits no tenderness and no swelling.       Left lower leg: He exhibits no tenderness and no swelling.  Right knee shows no swelling. Range is 0-125. No tenderness or instability. Left knee no effusion. Marked crepitus on range of motion of the left knee with some tenderness medial greater than lateral. No instability noted.   Neurological: He is alert and oriented to person, place, and time. He has normal strength and normal reflexes. No sensory deficit.  Skin: No rash noted. He is not diaphoretic. No erythema.  Psychiatric: He has a normal mood and affect. His behavior is normal.    Vitals  Weight: 199.4 lb Height: 69.5in Body Surface Area: 2.07 m Body Mass Index: 29.02 kg/m  Pulse: 76 (Regular)  BP: 138/78 (Sitting, Left Arm, Standard)  Imaging Review Plain radiographs demonstrate severe degenerative joint disease of the left knee(s). The overall alignment ismild varus. The bone quality appears to be good for age and reported activity level.  Assessment/Plan:  End stage primary osteoarthritis, left knee   The patient history, physical examination, clinical judgment of the provider and imaging studies are consistent with end stage degenerative joint disease of the left knee(s) and total knee arthroplasty is deemed medically necessary. The treatment options including medical management, injection therapy arthroscopy and  arthroplasty were discussed at length. The risks and benefits  of total knee arthroplasty were presented and reviewed. The risks due to aseptic loosening, infection, stiffness, patella tracking problems, thromboembolic complications and other imponderables were discussed. The patient acknowledged the explanation, agreed to proceed with the plan and consent was signed. Patient is being admitted for inpatient treatment for surgery, pain control, PT, OT, prophylactic antibiotics, VTE prophylaxis, progressive ambulation and ADL's and discharge planning. The patient is planning to be discharged home with home health services   Topical TXA PCP: Dr. Alwyn Ren Cardio: Dr. Murrell Converse, PA-C

## 2014-08-03 ENCOUNTER — Inpatient Hospital Stay (HOSPITAL_COMMUNITY): Payer: Medicare Other | Admitting: Registered Nurse

## 2014-08-03 ENCOUNTER — Inpatient Hospital Stay (HOSPITAL_COMMUNITY)
Admission: RE | Admit: 2014-08-03 | Discharge: 2014-08-06 | DRG: 470 | Disposition: A | Discharge status: Skilled Nursing Facility | Payer: Medicare Other | Source: Ambulatory Visit | Attending: Orthopedic Surgery | Admitting: Orthopedic Surgery

## 2014-08-03 ENCOUNTER — Encounter (HOSPITAL_COMMUNITY): Payer: Self-pay | Admitting: *Deleted

## 2014-08-03 ENCOUNTER — Encounter (HOSPITAL_COMMUNITY)
Admission: RE | Disposition: A | Discharge status: Skilled Nursing Facility | Payer: Self-pay | Source: Ambulatory Visit | Attending: Orthopedic Surgery

## 2014-08-03 DIAGNOSIS — G250 Essential tremor: Secondary | ICD-10-CM | POA: Diagnosis not present

## 2014-08-03 DIAGNOSIS — M1710 Unilateral primary osteoarthritis, unspecified knee: Secondary | ICD-10-CM | POA: Diagnosis present

## 2014-08-03 DIAGNOSIS — M179 Osteoarthritis of knee, unspecified: Secondary | ICD-10-CM | POA: Diagnosis not present

## 2014-08-03 DIAGNOSIS — M069 Rheumatoid arthritis, unspecified: Secondary | ICD-10-CM | POA: Diagnosis not present

## 2014-08-03 DIAGNOSIS — Z96652 Presence of left artificial knee joint: Secondary | ICD-10-CM | POA: Diagnosis not present

## 2014-08-03 DIAGNOSIS — E785 Hyperlipidemia, unspecified: Secondary | ICD-10-CM | POA: Diagnosis present

## 2014-08-03 DIAGNOSIS — Z01812 Encounter for preprocedural laboratory examination: Secondary | ICD-10-CM | POA: Diagnosis not present

## 2014-08-03 DIAGNOSIS — R509 Fever, unspecified: Secondary | ICD-10-CM | POA: Diagnosis not present

## 2014-08-03 DIAGNOSIS — Z87891 Personal history of nicotine dependence: Secondary | ICD-10-CM | POA: Diagnosis not present

## 2014-08-03 DIAGNOSIS — T814XXA Infection following a procedure, initial encounter: Secondary | ICD-10-CM | POA: Diagnosis not present

## 2014-08-03 DIAGNOSIS — M25562 Pain in left knee: Secondary | ICD-10-CM | POA: Diagnosis present

## 2014-08-03 DIAGNOSIS — R05 Cough: Secondary | ICD-10-CM | POA: Diagnosis not present

## 2014-08-03 DIAGNOSIS — I2510 Atherosclerotic heart disease of native coronary artery without angina pectoris: Secondary | ICD-10-CM | POA: Diagnosis not present

## 2014-08-03 DIAGNOSIS — M6281 Muscle weakness (generalized): Secondary | ICD-10-CM | POA: Diagnosis not present

## 2014-08-03 DIAGNOSIS — M1712 Unilateral primary osteoarthritis, left knee: Secondary | ICD-10-CM | POA: Diagnosis not present

## 2014-08-03 DIAGNOSIS — Z471 Aftercare following joint replacement surgery: Secondary | ICD-10-CM | POA: Diagnosis not present

## 2014-08-03 DIAGNOSIS — I10 Essential (primary) hypertension: Secondary | ICD-10-CM | POA: Diagnosis present

## 2014-08-03 DIAGNOSIS — K219 Gastro-esophageal reflux disease without esophagitis: Secondary | ICD-10-CM | POA: Diagnosis not present

## 2014-08-03 DIAGNOSIS — D649 Anemia, unspecified: Secondary | ICD-10-CM | POA: Diagnosis not present

## 2014-08-03 DIAGNOSIS — R262 Difficulty in walking, not elsewhere classified: Secondary | ICD-10-CM | POA: Diagnosis not present

## 2014-08-03 HISTORY — PX: TOTAL KNEE ARTHROPLASTY: SHX125

## 2014-08-03 LAB — BASIC METABOLIC PANEL
ANION GAP: 8 (ref 5–15)
BUN: 18 mg/dL (ref 6–23)
CO2: 23 mmol/L (ref 19–32)
Calcium: 9 mg/dL (ref 8.4–10.5)
Chloride: 103 mmol/L (ref 96–112)
Creatinine, Ser: 1.02 mg/dL (ref 0.50–1.35)
GFR calc Af Amer: 80 mL/min — ABNORMAL LOW (ref >90)
GFR, EST NON AFRICAN AMERICAN: 69 mL/min — AB (ref >90)
Glucose, Bld: 114 mg/dL — ABNORMAL HIGH (ref 70–99)
Potassium: 4.1 mmol/L (ref 3.5–5.1)
SODIUM: 134 mmol/L — AB (ref 135–145)

## 2014-08-03 LAB — TYPE AND SCREEN
ABO/RH(D): O POS
Antibody Screen: NEGATIVE

## 2014-08-03 SURGERY — ARTHROPLASTY, KNEE, TOTAL
Anesthesia: Monitor Anesthesia Care | Site: Knee | Laterality: Left

## 2014-08-03 MED ORDER — TRANEXAMIC ACID 100 MG/ML IV SOLN
2000.0000 mg | INTRAVENOUS | Status: DC | PRN
  Administered 2014-08-03: 2000 mg via TOPICAL

## 2014-08-03 MED ORDER — BUPIVACAINE LIPOSOME 1.3 % IJ SUSP
INTRAMUSCULAR | Status: DC | PRN
  Administered 2014-08-03: 20 mL

## 2014-08-03 MED ORDER — CEFAZOLIN SODIUM-DEXTROSE 2-3 GM-% IV SOLR
INTRAVENOUS | Status: AC
  Filled 2014-08-03: qty 50

## 2014-08-03 MED ORDER — SODIUM CHLORIDE 0.9 % IV SOLN
INTRAVENOUS | Status: DC
  Administered 2014-08-03 (×2): via INTRAVENOUS

## 2014-08-03 MED ORDER — CEFAZOLIN SODIUM-DEXTROSE 2-3 GM-% IV SOLR
2.0000 g | Freq: Four times a day (QID) | INTRAVENOUS | Status: AC
  Administered 2014-08-03 (×2): 2 g via INTRAVENOUS
  Filled 2014-08-03 (×2): qty 50

## 2014-08-03 MED ORDER — SODIUM CHLORIDE 0.9 % IR SOLN
Status: DC | PRN
  Administered 2014-08-03: 1000 mL

## 2014-08-03 MED ORDER — HYDROMORPHONE HCL 1 MG/ML IJ SOLN: 0.2500 mg | INTRAMUSCULAR | Status: DC | PRN

## 2014-08-03 MED ORDER — OXYCODONE HCL 5 MG/5ML PO SOLN
5.0000 mg | Freq: Once | ORAL | Status: DC | PRN
  Filled 2014-08-03: qty 5

## 2014-08-03 MED ORDER — MORPHINE SULFATE 2 MG/ML IJ SOLN: 1.0000 mg | INTRAMUSCULAR | Status: DC | PRN

## 2014-08-03 MED ORDER — ONDANSETRON HCL 4 MG PO TABS: 4.0000 mg | ORAL_TABLET | Freq: Four times a day (QID) | ORAL | Status: DC | PRN

## 2014-08-03 MED ORDER — LACTATED RINGERS IV SOLN
INTRAVENOUS | Status: DC | PRN
  Administered 2014-08-03 (×3): via INTRAVENOUS

## 2014-08-03 MED ORDER — DOCUSATE SODIUM 100 MG PO CAPS
100.0000 mg | ORAL_CAPSULE | Freq: Two times a day (BID) | ORAL | Status: DC
  Administered 2014-08-03 – 2014-08-05 (×6): 100 mg via ORAL

## 2014-08-03 MED ORDER — PREDNISONE 20 MG PO TABS
20.0000 mg | ORAL_TABLET | Freq: Every day | ORAL | Status: AC
  Filled 2014-08-03: qty 1

## 2014-08-03 MED ORDER — PREDNISONE 10 MG PO TABS
10.0000 mg | ORAL_TABLET | Freq: Two times a day (BID) | ORAL | Status: AC
  Administered 2014-08-04 (×2): 10 mg via ORAL
  Filled 2014-08-03 (×2): qty 1

## 2014-08-03 MED ORDER — BUPIVACAINE HCL 0.25 % IJ SOLN
INTRAMUSCULAR | Status: DC | PRN
  Administered 2014-08-03: 20 mL

## 2014-08-03 MED ORDER — PRIMIDONE 50 MG PO TABS
100.0000 mg | ORAL_TABLET | Freq: Two times a day (BID) | ORAL | Status: DC
  Administered 2014-08-03 – 2014-08-06 (×7): 100 mg via ORAL
  Filled 2014-08-03 (×8): qty 2

## 2014-08-03 MED ORDER — RIVAROXABAN 10 MG PO TABS: 10.0000 mg | ORAL_TABLET | Freq: Every day | ORAL | Status: DC

## 2014-08-03 MED ORDER — PREDNISONE 5 MG PO TABS
5.0000 mg | ORAL_TABLET | Freq: Two times a day (BID) | ORAL | Status: AC
  Administered 2014-08-05 (×2): 5 mg via ORAL
  Filled 2014-08-03 (×2): qty 1

## 2014-08-03 MED ORDER — METOCLOPRAMIDE HCL 10 MG PO TABS: 5.0000 mg | ORAL_TABLET | Freq: Three times a day (TID) | ORAL | Status: DC | PRN

## 2014-08-03 MED ORDER — DEXAMETHASONE SODIUM PHOSPHATE 10 MG/ML IJ SOLN
INTRAMUSCULAR | Status: AC
  Filled 2014-08-03: qty 1

## 2014-08-03 MED ORDER — PROMETHAZINE HCL 25 MG/ML IJ SOLN: 6.2500 mg | INTRAMUSCULAR | Status: DC | PRN

## 2014-08-03 MED ORDER — PROPOFOL 10 MG/ML IV BOLUS
INTRAVENOUS | Status: AC
  Filled 2014-08-03: qty 20

## 2014-08-03 MED ORDER — PREDNISONE 5 MG PO TABS
5.0000 mg | ORAL_TABLET | Freq: Every day | ORAL | Status: DC
  Administered 2014-08-06: 5 mg via ORAL
  Filled 2014-08-03 (×2): qty 1

## 2014-08-03 MED ORDER — DEXAMETHASONE SODIUM PHOSPHATE 10 MG/ML IJ SOLN
10.0000 mg | Freq: Once | INTRAMUSCULAR | Status: AC
  Administered 2014-08-03: 10 mg via INTRAVENOUS

## 2014-08-03 MED ORDER — CHLORHEXIDINE GLUCONATE 4 % EX LIQD: 60.0000 mL | Freq: Once | CUTANEOUS | Status: DC

## 2014-08-03 MED ORDER — MIDAZOLAM HCL 2 MG/2ML IJ SOLN
INTRAMUSCULAR | Status: AC
  Filled 2014-08-03: qty 2

## 2014-08-03 MED ORDER — BUPIVACAINE LIPOSOME 1.3 % IJ SUSP
20.0000 mL | Freq: Once | INTRAMUSCULAR | Status: DC
  Filled 2014-08-03: qty 20

## 2014-08-03 MED ORDER — FENTANYL CITRATE 0.05 MG/ML IJ SOLN
INTRAMUSCULAR | Status: DC | PRN
  Administered 2014-08-03 (×2): 50 ug via INTRAVENOUS

## 2014-08-03 MED ORDER — MENTHOL 3 MG MT LOZG
1.0000 | LOZENGE | OROMUCOSAL | Status: DC | PRN
  Filled 2014-08-03: qty 9

## 2014-08-03 MED ORDER — TRANEXAMIC ACID 100 MG/ML IV SOLN
2000.0000 mg | Freq: Once | INTRAVENOUS | Status: DC
  Filled 2014-08-03: qty 20

## 2014-08-03 MED ORDER — FLEET ENEMA 7-19 GM/118ML RE ENEM: 1.0000 | ENEMA | Freq: Once | RECTAL | Status: AC | PRN

## 2014-08-03 MED ORDER — FENTANYL CITRATE 0.05 MG/ML IJ SOLN
INTRAMUSCULAR | Status: AC
  Filled 2014-08-03: qty 2

## 2014-08-03 MED ORDER — SODIUM CHLORIDE 0.9 % IV SOLN: INTRAVENOUS | Status: DC

## 2014-08-03 MED ORDER — DEXAMETHASONE SODIUM PHOSPHATE 10 MG/ML IJ SOLN
10.0000 mg | Freq: Once | INTRAMUSCULAR | Status: DC
  Filled 2014-08-03: qty 1

## 2014-08-03 MED ORDER — METOPROLOL SUCCINATE ER 25 MG PO TB24
25.0000 mg | ORAL_TABLET | Freq: Every day | ORAL | Status: DC
  Administered 2014-08-04 – 2014-08-06 (×3): 25 mg via ORAL
  Filled 2014-08-03 (×3): qty 1

## 2014-08-03 MED ORDER — SODIUM CHLORIDE 0.9 % IJ SOLN
INTRAMUSCULAR | Status: DC | PRN
  Administered 2014-08-03: 30 mL

## 2014-08-03 MED ORDER — MIDAZOLAM HCL 5 MG/5ML IJ SOLN
INTRAMUSCULAR | Status: DC | PRN
  Administered 2014-08-03: 1 mg via INTRAVENOUS

## 2014-08-03 MED ORDER — PROPOFOL INFUSION 10 MG/ML OPTIME
INTRAVENOUS | Status: DC | PRN
  Administered 2014-08-03: 100 ug/kg/min via INTRAVENOUS

## 2014-08-03 MED ORDER — BISACODYL 10 MG RE SUPP: 10.0000 mg | Freq: Every day | RECTAL | Status: DC | PRN

## 2014-08-03 MED ORDER — BUPIVACAINE HCL (PF) 0.25 % IJ SOLN
INTRAMUSCULAR | Status: AC
  Filled 2014-08-03: qty 30

## 2014-08-03 MED ORDER — ONDANSETRON HCL 4 MG/2ML IJ SOLN: 4.0000 mg | Freq: Four times a day (QID) | INTRAMUSCULAR | Status: DC | PRN

## 2014-08-03 MED ORDER — PHENYLEPHRINE HCL 10 MG/ML IJ SOLN
INTRAMUSCULAR | Status: DC | PRN
  Administered 2014-08-03: 80 ug via INTRAVENOUS
  Administered 2014-08-03: 120 ug via INTRAVENOUS

## 2014-08-03 MED ORDER — POLYETHYLENE GLYCOL 3350 17 G PO PACK: 17.0000 g | PACK | Freq: Every day | ORAL | Status: DC | PRN

## 2014-08-03 MED ORDER — ACETAMINOPHEN 10 MG/ML IV SOLN
1000.0000 mg | Freq: Once | INTRAVENOUS | Status: AC
  Administered 2014-08-03: 1000 mg via INTRAVENOUS
  Filled 2014-08-03: qty 100

## 2014-08-03 MED ORDER — 0.9 % SODIUM CHLORIDE (POUR BTL) OPTIME
TOPICAL | Status: DC | PRN
  Administered 2014-08-03: 1000 mL

## 2014-08-03 MED ORDER — OXYCODONE HCL 5 MG PO TABS: 5.0000 mg | ORAL_TABLET | Freq: Once | ORAL | Status: DC | PRN

## 2014-08-03 MED ORDER — ROSUVASTATIN CALCIUM 20 MG PO TABS
20.0000 mg | ORAL_TABLET | Freq: Every day | ORAL | Status: DC
  Administered 2014-08-03 – 2014-08-05 (×3): 20 mg via ORAL
  Filled 2014-08-03 (×4): qty 1

## 2014-08-03 MED ORDER — BUPIVACAINE IN DEXTROSE 0.75-8.25 % IT SOLN
INTRATHECAL | Status: DC | PRN
  Administered 2014-08-03: 2 mL via INTRATHECAL

## 2014-08-03 MED ORDER — ACETAMINOPHEN 650 MG RE SUPP: 650.0000 mg | Freq: Four times a day (QID) | RECTAL | Status: DC | PRN

## 2014-08-03 MED ORDER — PAROXETINE HCL 20 MG PO TABS
20.0000 mg | ORAL_TABLET | Freq: Every day | ORAL | Status: DC
  Administered 2014-08-03 – 2014-08-06 (×4): 20 mg via ORAL
  Filled 2014-08-03 (×4): qty 1

## 2014-08-03 MED ORDER — LACTATED RINGERS IV SOLN
INTRAVENOUS | Status: DC
  Administered 2014-08-03: 1000 mL via INTRAVENOUS

## 2014-08-03 MED ORDER — METHOCARBAMOL 1000 MG/10ML IJ SOLN
500.0000 mg | Freq: Four times a day (QID) | INTRAVENOUS | Status: DC | PRN
  Administered 2014-08-03: 500 mg via INTRAVENOUS
  Filled 2014-08-03 (×2): qty 5

## 2014-08-03 MED ORDER — ACETAMINOPHEN 325 MG PO TABS
650.0000 mg | ORAL_TABLET | Freq: Four times a day (QID) | ORAL | Status: DC | PRN
  Administered 2014-08-05: 650 mg via ORAL
  Filled 2014-08-03: qty 2

## 2014-08-03 MED ORDER — OXYCODONE HCL 5 MG PO TABS
5.0000 mg | ORAL_TABLET | ORAL | Status: DC | PRN
  Administered 2014-08-03 (×2): 5 mg via ORAL
  Administered 2014-08-04 – 2014-08-05 (×7): 10 mg via ORAL
  Filled 2014-08-03 (×4): qty 2
  Filled 2014-08-03: qty 1
  Filled 2014-08-03 (×4): qty 2

## 2014-08-03 MED ORDER — SODIUM CHLORIDE 0.9 % IJ SOLN
INTRAMUSCULAR | Status: AC
  Filled 2014-08-03: qty 50

## 2014-08-03 MED ORDER — PHENOL 1.4 % MT LIQD
1.0000 | OROMUCOSAL | Status: DC | PRN
  Filled 2014-08-03: qty 177

## 2014-08-03 MED ORDER — CEFAZOLIN SODIUM-DEXTROSE 2-3 GM-% IV SOLR
2.0000 g | INTRAVENOUS | Status: AC
  Administered 2014-08-03: 2 g via INTRAVENOUS

## 2014-08-03 MED ORDER — METHOCARBAMOL 500 MG PO TABS
500.0000 mg | ORAL_TABLET | Freq: Four times a day (QID) | ORAL | Status: DC | PRN
  Administered 2014-08-04 – 2014-08-05 (×3): 500 mg via ORAL
  Filled 2014-08-03 (×3): qty 1

## 2014-08-03 MED ORDER — DIPHENHYDRAMINE HCL 12.5 MG/5ML PO ELIX: 12.5000 mg | ORAL_SOLUTION | ORAL | Status: DC | PRN

## 2014-08-03 MED ORDER — ACETAMINOPHEN 500 MG PO TABS
1000.0000 mg | ORAL_TABLET | Freq: Four times a day (QID) | ORAL | Status: AC
  Administered 2014-08-03 – 2014-08-04 (×4): 1000 mg via ORAL
  Filled 2014-08-03 (×6): qty 2

## 2014-08-03 MED ORDER — METOCLOPRAMIDE HCL 5 MG/ML IJ SOLN: 5.0000 mg | Freq: Three times a day (TID) | INTRAMUSCULAR | Status: DC | PRN

## 2014-08-03 MED ORDER — ENOXAPARIN SODIUM 30 MG/0.3ML ~~LOC~~ SOLN
30.0000 mg | Freq: Two times a day (BID) | SUBCUTANEOUS | Status: DC
  Administered 2014-08-04 (×2): 30 mg via SUBCUTANEOUS
  Filled 2014-08-03 (×5): qty 0.3

## 2014-08-03 MED ORDER — PHENYLEPHRINE 40 MCG/ML (10ML) SYRINGE FOR IV PUSH (FOR BLOOD PRESSURE SUPPORT)
PREFILLED_SYRINGE | INTRAVENOUS | Status: AC
  Filled 2014-08-03: qty 10

## 2014-08-03 SURGICAL SUPPLY — 69 items
BAG DECANTER FOR FLEXI CONT (MISCELLANEOUS) ×2 IMPLANT
BAG SPEC THK2 15X12 ZIP CLS (MISCELLANEOUS) ×1
BAG ZIPLOCK 12X15 (MISCELLANEOUS) ×2 IMPLANT
BANDAGE ELASTIC 6 VELCRO ST LF (GAUZE/BANDAGES/DRESSINGS) ×2 IMPLANT
BANDAGE ESMARK 6X9 LF (GAUZE/BANDAGES/DRESSINGS) ×1 IMPLANT
BLADE SAG 18X100X1.27 (BLADE) ×2 IMPLANT
BLADE SAW SGTL 11.0X1.19X90.0M (BLADE) ×2 IMPLANT
BNDG ESMARK 6X9 LF (GAUZE/BANDAGES/DRESSINGS) ×2
BOWL SMART MIX CTS (DISPOSABLE) ×2 IMPLANT
CAP KNEE TOTAL 3 SIGMA ×2 IMPLANT
CEMENT HV SMART SET (Cement) ×4 IMPLANT
CUFF TOURN SGL QUICK 34 (TOURNIQUET CUFF) ×1
CUFF TRNQT CYL 34X4X40X1 (TOURNIQUET CUFF) ×1 IMPLANT
DECANTER SPIKE VIAL GLASS SM (MISCELLANEOUS) ×2 IMPLANT
DRAPE EXTREMITY T 121X128X90 (DRAPE) ×2 IMPLANT
DRAPE POUCH INSTRU U-SHP 10X18 (DRAPES) ×2 IMPLANT
DRAPE U-SHAPE 47X51 STRL (DRAPES) ×2 IMPLANT
DRSG ADAPTIC 3X8 NADH LF (GAUZE/BANDAGES/DRESSINGS) ×2 IMPLANT
DRSG PAD ABDOMINAL 8X10 ST (GAUZE/BANDAGES/DRESSINGS) ×2 IMPLANT
DURAPREP 26ML APPLICATOR (WOUND CARE) ×2 IMPLANT
ELECT REM PT RETURN 9FT ADLT (ELECTROSURGICAL) ×2
ELECTRODE REM PT RTRN 9FT ADLT (ELECTROSURGICAL) ×1 IMPLANT
EVACUATOR 1/8 PVC DRAIN (DRAIN) ×2 IMPLANT
FACESHIELD WRAPAROUND (MASK) ×10 IMPLANT
GAUZE SPONGE 4X4 12PLY STRL (GAUZE/BANDAGES/DRESSINGS) ×2 IMPLANT
GLOVE BIO SURGEON STRL SZ7.5 (GLOVE) ×2 IMPLANT
GLOVE BIO SURGEON STRL SZ8 (GLOVE) ×2 IMPLANT
GLOVE BIOGEL PI IND STRL 6.5 (GLOVE) IMPLANT
GLOVE BIOGEL PI IND STRL 7.0 (GLOVE) ×1 IMPLANT
GLOVE BIOGEL PI IND STRL 7.5 (GLOVE) ×1 IMPLANT
GLOVE BIOGEL PI IND STRL 8 (GLOVE) ×2 IMPLANT
GLOVE BIOGEL PI INDICATOR 6.5 (GLOVE)
GLOVE BIOGEL PI INDICATOR 7.0 (GLOVE) ×1
GLOVE BIOGEL PI INDICATOR 7.5 (GLOVE) ×1
GLOVE BIOGEL PI INDICATOR 8 (GLOVE) ×2
GLOVE SURG SS PI 6.5 STRL IVOR (GLOVE) IMPLANT
GLOVE SURG SS PI 7.0 STRL IVOR (GLOVE) ×2 IMPLANT
GLOVE SURG SS PI 7.5 STRL IVOR (GLOVE) ×2 IMPLANT
GOWN BRE IMP PREV XXLGXLNG (GOWN DISPOSABLE) ×2 IMPLANT
GOWN STRL REUS W/TWL LRG LVL3 (GOWN DISPOSABLE) ×2 IMPLANT
GOWN STRL REUS W/TWL XL LVL3 (GOWN DISPOSABLE) ×4 IMPLANT
HANDPIECE INTERPULSE COAX TIP (DISPOSABLE) ×1
IMMOBILIZER KNEE 20 (SOFTGOODS) ×2
IMMOBILIZER KNEE 20 THIGH 36 (SOFTGOODS) ×1 IMPLANT
KIT BASIN OR (CUSTOM PROCEDURE TRAY) ×2 IMPLANT
MANIFOLD NEPTUNE II (INSTRUMENTS) ×2 IMPLANT
NDL SAFETY ECLIPSE 18X1.5 (NEEDLE) ×2 IMPLANT
NEEDLE HYPO 18GX1.5 SHARP (NEEDLE) ×2
NS IRRIG 1000ML POUR BTL (IV SOLUTION) ×2 IMPLANT
PACK TOTAL JOINT (CUSTOM PROCEDURE TRAY) ×2 IMPLANT
PAD ABD 8X10 STRL (GAUZE/BANDAGES/DRESSINGS) ×2 IMPLANT
PADDING CAST COTTON 6X4 STRL (CAST SUPPLIES) ×6 IMPLANT
PEN SKIN MARKING BROAD (MISCELLANEOUS) ×2 IMPLANT
POSITIONER SURGICAL ARM (MISCELLANEOUS) ×2 IMPLANT
SET HNDPC FAN SPRY TIP SCT (DISPOSABLE) ×1 IMPLANT
STRIP CLOSURE SKIN 1/2X4 (GAUZE/BANDAGES/DRESSINGS) ×2 IMPLANT
SUCTION FRAZIER 12FR DISP (SUCTIONS) ×2 IMPLANT
SUT MNCRL AB 4-0 PS2 18 (SUTURE) ×2 IMPLANT
SUT VIC AB 2-0 CT1 27 (SUTURE) ×6
SUT VIC AB 2-0 CT1 TAPERPNT 27 (SUTURE) ×3 IMPLANT
SUT VLOC 180 0 24IN GS25 (SUTURE) ×2 IMPLANT
SYR 20CC LL (SYRINGE) ×2 IMPLANT
SYR 50ML LL SCALE MARK (SYRINGE) ×2 IMPLANT
TOWEL OR 17X26 10 PK STRL BLUE (TOWEL DISPOSABLE) ×2 IMPLANT
TOWEL OR NON WOVEN STRL DISP B (DISPOSABLE) ×2 IMPLANT
TRAY FOLEY CATH 16FRSI W/METER (SET/KITS/TRAYS/PACK) ×2 IMPLANT
WATER STERILE IRR 1500ML POUR (IV SOLUTION) ×2 IMPLANT
WRAP KNEE MAXI GEL POST OP (GAUZE/BANDAGES/DRESSINGS) ×2 IMPLANT
YANKAUER SUCT BULB TIP 10FT TU (MISCELLANEOUS) ×2 IMPLANT

## 2014-08-03 NOTE — Anesthesia Postprocedure Evaluation (Signed)
  Anesthesia Post-op Note  Patient: Ethan Robinson  Procedure(s) Performed: Procedure(s) (LRB): LEFT TOTAL KNEE ARTHROPLASTY (Left)  Patient Location: PACU  Anesthesia Type: Spinal  Level of Consciousness: awake and alert   Airway and Oxygen Therapy: Patient Spontanous Breathing  Post-op Pain: mild  Post-op Assessment: Post-op Vital signs reviewed, Patient's Cardiovascular Status Stable, Respiratory Function Stable, Patent Airway and No signs of Nausea or vomiting  Last Vitals:  Filed Vitals:   08/03/14 1145  BP: 137/91  Pulse: 68  Temp: 36.9 C  Resp: 14    Post-op Vital Signs: stable   Complications: No apparent anesthesia complications. No complaints.

## 2014-08-03 NOTE — Interval H&P Note (Signed)
History and Physical Interval Note:  08/03/2014 7:05 AM  Ethan Robinson  has presented today for surgery, with the diagnosis of LEFT KNEE OA   The various methods of treatment have been discussed with the patient and family. After consideration of risks, benefits and other options for treatment, the patient has consented to  Procedure(s): LEFT TOTAL KNEE ARTHROPLASTY (Left) as a surgical intervention .  The patient's history has been reviewed, patient examined, no change in status, stable for surgery.  I have reviewed the patient's chart and labs.  Questions were answered to the patient's satisfaction.     Loanne Drilling

## 2014-08-03 NOTE — Anesthesia Preprocedure Evaluation (Addendum)
Anesthesia Evaluation  Patient identified by MRN, date of birth, ID band Patient awake    Reviewed: Allergy & Precautions, NPO status , Patient's Chart, lab work & pertinent test results  Airway Mallampati: II  TM Distance: >3 FB Neck ROM: Full    Dental  (+) Teeth Intact   Pulmonary asthma , former smoker,  breath sounds clear to auscultation        Cardiovascular hypertension, + CAD and + Cardiac Stents Rhythm:Regular Rate:Normal     Neuro/Psych Depression negative neurological ROS     GI/Hepatic Neg liver ROS, PUD, GERD-  ,  Endo/Other  negative endocrine ROS  Renal/GU negative Renal ROS     Musculoskeletal  (+) Arthritis -, Osteoarthritis, Rheumatoid disorders and on steriods ,    Abdominal   Peds  Hematology  (+) anemia ,   Anesthesia Other Findings   Reproductive/Obstetrics                            Anesthesia Physical Anesthesia Plan  ASA: III  Anesthesia Plan: Spinal and MAC   Post-op Pain Management:    Induction: Intravenous  Airway Management Planned: Natural Airway and Simple Face Mask  Additional Equipment:   Intra-op Plan:   Post-operative Plan:   Informed Consent: I have reviewed the patients History and Physical, chart, labs and discussed the procedure including the risks, benefits and alternatives for the proposed anesthesia with the patient or authorized representative who has indicated his/her understanding and acceptance.     Plan Discussed with: CRNA  Anesthesia Plan Comments:        Anesthesia Quick Evaluation

## 2014-08-03 NOTE — Evaluation (Signed)
Physical Therapy Evaluation Patient Details Name: Ethan Robinson MRN: [id number] DOB: 28-Oct-1934 Today's Date: 08/03/2014   History of Present Illness  L TKA  Clinical Impression  Pt is s/p TKA resulting in the deficits listed below (see PT Problem List).  Pt will benefit from skilled PT to increase their independence and safety with mobility to allow discharge to the venue listed below.   Pt ambulated 69' with RW and min/guard assist. Initiated L TKA exercises, pt was able to do SLR independently. Good progress expected.      Follow Up Recommendations Supervision for mobility/OOB;Home health PT    Equipment Recommendations  None recommended by PT    Recommendations for Other Services OT consult     Precautions / Restrictions Precautions Precautions: Knee Restrictions Weight Bearing Restrictions: No Other Position/Activity Restrictions: WBAT      Mobility  Bed Mobility Overal bed mobility: Needs Assistance Bed Mobility: Supine to Sit     Supine to sit: Min assist     General bed mobility comments: min A LLE OOB  Transfers Overall transfer level: Needs assistance Equipment used: Rolling walker (2 wheeled) Transfers: Sit to/from Stand Sit to Stand: Min guard         General transfer comment: cues for hand placement, min/guard for balance  Ambulation/Gait Ambulation/Gait assistance: Min guard Ambulation Distance (Feet): 45 Feet Assistive device: Rolling walker (2 wheeled) Gait Pattern/deviations: Step-to pattern;Decreased step length - left;Trunk flexed   Gait velocity interpretation: Below normal speed for age/gender General Gait Details: cues for positioning in RW and for sequencing, min/guard for balance  Stairs            Wheelchair Mobility    Modified Rankin (Stroke Patients Only)       Balance Overall balance assessment: Modified Independent                                           Pertinent Vitals/Pain Pain  Assessment: 0-10 Pain Score: 6  Pain Location: L knee with walking Pain Descriptors / Indicators: Sore Pain Intervention(s): Premedicated before session;Monitored during session;Limited activity within patient's tolerance;Ice applied    Home Living Family/patient expects to be discharged to:: Private residence Living Arrangements: Spouse/significant other Available Help at Discharge: Family;Available 24 hours/day Type of Home: House Home Access: Stairs to enter Entrance Stairs-Rails: Left Entrance Stairs-Number of Steps: 4 Home Layout: One level;Able to live on main level with bedroom/bathroom Home Equipment: Toilet riser;Walker - 2 wheels      Prior Function Level of Independence: Independent               Hand Dominance        Extremity/Trunk Assessment   Upper Extremity Assessment: Overall WFL for tasks assessed           Lower Extremity Assessment: LLE deficits/detail   LLE Deficits / Details: 0-40* knee AAROM, SLR 3/5, ankle WNL  Cervical / Trunk Assessment: Normal  Communication   Communication: No difficulties  Cognition Arousal/Alertness: Awake/alert Behavior During Therapy: WFL for tasks assessed/performed Overall Cognitive Status: Within Functional Limits for tasks assessed                      General Comments      Exercises Total Joint Exercises Ankle Circles/Pumps: AROM;Both;10 reps Quad Sets: AROM;Left;5 reps Heel Slides: AAROM;Left;10 reps;Supine Straight Leg Raises: AROM;Left;5 reps;Supine Goniometric ROM: 0-35* L  knee AAROM      Assessment/Plan    PT Assessment Patient needs continued PT services  PT Diagnosis Difficulty walking;Acute pain   PT Problem List Decreased strength;Decreased range of motion;Decreased activity tolerance;Pain;Decreased knowledge of use of DME;Decreased mobility  PT Treatment Interventions Gait training;DME instruction;Stair training;Functional mobility training;Therapeutic  activities;Patient/family education;Therapeutic exercise   PT Goals (Current goals can be found in the Care Plan section) Acute Rehab PT Goals Patient Stated Goal: to walk better PT Goal Formulation: With patient Time For Goal Achievement: 08/17/14 Potential to Achieve Goals: Good    Frequency 7X/week   Barriers to discharge        Co-evaluation               End of Session Equipment Utilized During Treatment: Gait belt Activity Tolerance: Patient tolerated treatment well Patient left: in chair;with call bell/phone within reach Nurse Communication: Mobility status         Time: 1040-4591 PT Time Calculation (min) (ACUTE ONLY): 23 min   Charges:   PT Evaluation $Initial PT Evaluation Tier I: 1 Procedure PT Treatments $Gait Training: 8-22 mins   PT G Codes:        Tamala Ser 08/03/2014, 5:18 PM [phone]

## 2014-08-03 NOTE — Anesthesia Procedure Notes (Signed)
Spinal Patient location during procedure: OR Staffing Anesthesiologist: Suzette Battiest E Performed by: anesthesiologist  Preanesthetic Checklist Completed: patient identified, site marked, surgical consent, pre-op evaluation, timeout performed, IV checked, risks and benefits discussed and monitors and equipment checked Spinal Block Patient position: sitting Prep: Betadine Patient monitoring: heart rate, continuous pulse ox and blood pressure Injection technique: single-shot Needle Needle type: Spinocan  Needle gauge: 22 G Needle length: 9 cm Additional Notes Expiration date of kit checked and confirmed. Patient tolerated procedure well, without complications.

## 2014-08-03 NOTE — Progress Notes (Signed)
Utilization review completed.  

## 2014-08-03 NOTE — Transfer of Care (Signed)
Immediate Anesthesia Transfer of Care Note  Patient: Ethan Robinson  Procedure(s) Performed: Procedure(s): LEFT TOTAL KNEE ARTHROPLASTY (Left)  Patient Location: PACU  Anesthesia Type:Spinal  Level of Consciousness: awake, alert , oriented and patient cooperative  Airway & Oxygen Therapy: Patient Spontanous Breathing and Patient connected to face mask [id number] spinal level  Post-op Assessment: Report given to RN  Post vital signs: stable  Last Vitals:  Filed Vitals:   08/03/14 0949  BP: 114/66  Pulse: 75  Temp: 36.6 C  Resp: 13    Complications: No apparent anesthesia complications

## 2014-08-03 NOTE — Op Note (Signed)
Pre-operative diagnosis- Osteoarthritis  Left knee(s)  Post-operative diagnosis- Osteoarthritis Left knee(s)  Procedure-  Left  Total Knee Arthroplasty  Surgeon- Gus Rankin. Alexx Giambra, MD  Assistant- Avel Peace, PA-C   Anesthesia-  Spinal  EBL-* No blood loss amount entered *   Drains Hemovac  Tourniquet time-  Total Tourniquet Time Documented: Thigh (Left) - 30 minutes Total: Thigh (Left) - 30 minutes     Complications- None  Condition-PACU - hemodynamically stable.   Brief Clinical Note   Ethan Robinson is a 80 y.o. year old male with end stage OA of his left knee with progressively worsening pain and dysfunction. He has constant pain, with activity and at rest and significant functional deficits with difficulties even with ADLs. He has had extensive non-op management including analgesics, injections of cortisone and viscosupplements, and home exercise program, but remains in significant pain with significant dysfunction. Radiographs show bone on bone arthritis medial and patellofemoral. He presents now for left Total Knee Arthroplasty.     Procedure in detail---   The patient is brought into the operating room and positioned supine on the operating table. After successful administration of  Spinal,   a tourniquet is placed high on the  Left thigh(s) and the lower extremity is prepped and draped in the usual sterile fashion. Time out is performed by the operating team and then the  Left lower extremity is wrapped in Esmarch, knee flexed and the tourniquet inflated to 300 mmHg.       A midline incision is made with a ten blade through the subcutaneous tissue to the level of the extensor mechanism. A fresh blade is used to make a medial parapatellar arthrotomy. Soft tissue over the proximal medial tibia is subperiosteally elevated to the joint line with a knife and into the semimembranosus bursa with a Cobb elevator. Soft tissue over the proximal lateral tibia is elevated with  attention being paid to avoiding the patellar tendon on the tibial tubercle. The patella is everted, knee flexed 90 degrees and the ACL and PCL are removed. Findings are bone on bone medial and patellofemoral with large global osteophytes.        The drill is used to create a starting hole in the distal femur and the canal is thoroughly irrigated with sterile saline to remove the fatty contents. The 5 degree Left  valgus alignment guide is placed into the femoral canal and the distal femoral cutting block is pinned to remove 10 mm off the distal femur. Resection is made with an oscillating saw.      The tibia is subluxed forward and the menisci are removed. The extramedullary alignment guide is placed referencing proximally at the medial aspect of the tibial tubercle and distally along the second metatarsal axis and tibial crest. The block is pinned to remove 21mm off the more deficient medial  side. Resection is made with an oscillating saw. Size 4is the most appropriate size for the tibia and the proximal tibia is prepared with the modular drill and keel punch for that size.      The femoral sizing guide is placed and size 4 is most appropriate. Rotation is marked off the epicondylar axis and confirmed by creating a rectangular flexion gap at 90 degrees. The size 4 cutting block is pinned in this rotation and the anterior, posterior and chamfer cuts are made with the oscillating saw. The intercondylar block is then placed and that cut is made.      Trial size 4  tibial component, trial size 4 posterior stabilized femur and a 12.5  mm posterior stabilized rotating platform insert trial is placed. Full extension is achieved with excellent varus/valgus and anterior/posterior balance throughout full range of motion. The patella is everted and thickness measured to be 27  mm. Free hand resection is taken to 15 mm, a 41 template is placed, lug holes are drilled, trial patella is placed, and it tracks normally.  Osteophytes are removed off the posterior femur with the trial in place. All trials are removed and the cut bone surfaces prepared with pulsatile lavage. Cement is mixed and once ready for implantation, the size 4 tibial implant, size  4 posterior stabilized femoral component, and the size 41 patella are cemented in place and the patella is held with the clamp. The trial insert is placed and the knee held in full extension. The Exparel (20 ml mixed with 30 ml saline) and .25% Bupivicaine, are injected into the extensor mechanism, posterior capsule, medial and lateral gutters and subcutaneous tissues.  All extruded cement is removed and once the cement is hard the permanent 12.5 mm posterior stabilized rotating platform insert is placed into the tibial tray.      The wound is copiously irrigated with saline solution and the extensor mechanism closed over a hemovac drain with #1 V-loc suture. The tourniquet is released for a total tourniquet time of 30  minutes. Flexion against gravity is 135 degrees and the patella tracks normally. Subcutaneous tissue is closed with 2.0 vicryl and subcuticular with running 4.0 Monocryl. The incision is cleaned and dried and steri-strips and a bulky sterile dressing are applied. The limb is placed into a knee immobilizer and the patient is awakened and transported to recovery in stable condition.      Please note that a surgical assistant was a medical necessity for this procedure in order to perform it in a safe and expeditious manner. Surgical assistant was necessary to retract the ligaments and vital neurovascular structures to prevent injury to them and also necessary for proper positioning of the limb to allow for anatomic placement of the prosthesis.   Gus Rankin Shazia Mitchener, MD    08/03/2014, 9:15 AM

## 2014-08-04 ENCOUNTER — Encounter (HOSPITAL_COMMUNITY): Payer: Self-pay | Admitting: Orthopedic Surgery

## 2014-08-04 LAB — CBC
HCT: 27.3 % — ABNORMAL LOW (ref 39.0–52.0)
HEMOGLOBIN: 9.2 g/dL — AB (ref 13.0–17.0)
MCH: 33.8 pg (ref 26.0–34.0)
MCHC: 33.7 g/dL (ref 30.0–36.0)
MCV: 100.4 fL — AB (ref 78.0–100.0)
Platelets: 159 10*3/uL (ref 150–400)
RBC: 2.72 MIL/uL — ABNORMAL LOW (ref 4.22–5.81)
RDW: 13.5 % (ref 11.5–15.5)
WBC: 9.9 10*3/uL (ref 4.0–10.5)

## 2014-08-04 LAB — BASIC METABOLIC PANEL
ANION GAP: 6 (ref 5–15)
BUN: 13 mg/dL (ref 6–23)
CHLORIDE: 105 mmol/L (ref 96–112)
CO2: 23 mmol/L (ref 19–32)
Calcium: 8.1 mg/dL — ABNORMAL LOW (ref 8.4–10.5)
Creatinine, Ser: 0.8 mg/dL (ref 0.50–1.35)
GFR calc Af Amer: >90 mL/min (ref >90)
GFR, EST NON AFRICAN AMERICAN: 84 mL/min — AB (ref >90)
GLUCOSE: 138 mg/dL — AB (ref 70–99)
POTASSIUM: 3.6 mmol/L (ref 3.5–5.1)
Sodium: 134 mmol/L — ABNORMAL LOW (ref 135–145)

## 2014-08-04 NOTE — Progress Notes (Signed)
Physical Therapy Treatment Patient Details Name: Ethan Robinson MRN: [id number] DOB: 01-19-1935 Today's Date: 08/04/2014    History of Present Illness L TKA    PT Comments    POD # 1 pm session.  Assisted with amb in hallway then back to bed.  Pt required increased time due to increased c/o fatigue. Pt plans to D/C to home tomorrow.  Follow Up Recommendations  Home health PT     Equipment Recommendations  None recommended by PT    Recommendations for Other Services       Precautions / Restrictions Precautions Precautions: Knee Precaution Comments: pt did SLR with PT Restrictions Weight Bearing Restrictions: No Other Position/Activity Restrictions: WBAT    Mobility  Bed Mobility Overal bed mobility: Needs Assistance Bed Mobility: Sit to Supine     Supine to sit: Min assist Sit to supine: Min assist   General bed mobility comments: min assist back to bed to support L LE  Transfers Overall transfer level: Needs assistance Equipment used: Rolling walker (2 wheeled) Transfers: Sit to/from Stand Sit to Stand: Min assist         General transfer comment: 50% VC's on proper tech and hand placement  Ambulation/Gait Ambulation/Gait assistance: Min assist Ambulation Distance (Feet): 45 Feet Assistive device: Rolling walker (2 wheeled) Gait Pattern/deviations: Step-to pattern;Step-through pattern Gait velocity: decreased   General Gait Details: cues for positioning in RW and for sequencing, min/guard for balance   Stairs            Wheelchair Mobility    Modified Rankin (Stroke Patients Only)       Balance                                    Cognition Arousal/Alertness: Awake/alert Behavior During Therapy: WFL for tasks assessed/performed Overall Cognitive Status: Within Functional Limits for tasks assessed                      Exercises      General Comments        Pertinent Vitals/Pain Pain Assessment:  0-10 Pain Score: 7  Pain Location: L knee Pain Descriptors / Indicators: Tender;Sore Pain Intervention(s): Monitored during session;Premedicated before session;Repositioned;Ice applied    Home Living                      Prior Function            PT Goals (current goals can now be found in the care plan section) Progress towards PT goals: Progressing toward goals    Frequency  7X/week    PT Plan      Co-evaluation             End of Session Equipment Utilized During Treatment: Gait belt;Left knee immobilizer Activity Tolerance: Patient tolerated treatment well Patient left: in chair;with call bell/phone within reach     Time: 1250-1315 PT Time Calculation (min) (ACUTE ONLY): 25 min  Charges:  $Gait Training: 8-22 mins $Therapeutic Activity: 8-22 mins                    G Codes:      Felecia Shelling  PTA [facility]  Acute  Rehab Pager      [phone]

## 2014-08-04 NOTE — Care Management Note (Addendum)
    Page 1 of 1   08/06/2014     12:16:02 PM CARE MANAGEMENT NOTE 08/06/2014  Patient:  Ethan Robinson, Ethan Robinson   Account Number:  [id number]  Date Initiated:  08/04/2014  Documentation initiated by:  [facility]  Subjective/Objective Assessment:   adm: LEFT TOTAL KNEE ARTHROPLASTY (Left)     Action/Plan:   discharge planning   Anticipated DC Date:  08/04/2014   Anticipated DC Plan:  [place]  CM consult      [facility] Choice  HOME HEALTH   Choice offered to / List presented to:  C-1 Patient           Status of service:  Completed, signed off Medicare Important Message given?   (If response is "NO", the following Medicare IM given date fields will be blank) Date Medicare IM given:   Medicare IM given by:   Date Additional Medicare IM given:   Additional Medicare IM given by:    Discharge Disposition:  [place]  Per UR Regulation:    If discussed at Long Length of Stay Meetings, dates discussed:    Comments:  08/06/14 12:14 CM notes pt to go to SNF; CSW arranging; [place] Go rep aware.  No other CM needs were communicated. Mariane Masters, BSN, Jearld Lesch [phone].  08/04/14 08:45 CM met with pt in room to offer choice of home health agency.  Pt chooses [facility] to render HHPT.  Pt has both a rolling walker and commode.  Address and contact information verified by pt.  Referral emailed to [employer], Tim. No other CM needs were communicated.  Mariane Masters, BSn, Cm [phone].

## 2014-08-04 NOTE — Evaluation (Signed)
Occupational Therapy Evaluation Patient Details Name: Ethan Robinson MRN: [id number] DOB: 1935-03-17 Today's Date: 08/04/2014    History of Present Illness L TKA   Clinical Impression   This 80 year old man was admitted for the above surgery.  He had his other knee done in '13. He will benefit from skilled OT in acute to increase safety and independence with ADLs/bathroom transfers.  Goals are for overall supervision and he is currently min A for goals which will be addressed.  His wife will assist with LB adls.      Follow Up Recommendations  No OT follow up;Supervision/Assistance - 24 hour    Equipment Recommendations  None recommended by OT (pt may want to consider a shower seat)    Recommendations for Other Services       Precautions / Restrictions Precautions Precautions: Knee Precaution Comments: pt did SLR with PT Restrictions Other Position/Activity Restrictions: WBAT      Mobility Bed Mobility   Bed Mobility: Supine to Sit     Supine to sit: Min assist     General bed mobility comments: min A LLE OOB  Transfers   Equipment used: Rolling walker (2 wheeled) Transfers: Sit to/from Stand Sit to Stand: Min assist         General transfer comment: light min A; pt shaky.  Cues for UE/LE placement    Balance Overall balance assessment:  (Min guard to min A:  1 LOB)                                          ADL Overall ADL's : Needs assistance/impaired                         Toilet Transfer: Minimal assistance;Ambulation;BSC             General ADL Comments: Pt does not have AE:  wife will assist as needed with LB ADLs.  Pt had one LOB when he first stood up from commode, requiring support to regain.  Demonstrated stepping into shower stall, but pt did not practice.  I recommended that he sponge bathe if he doesn't get a shower seat.  Discussed placement of this.      Vision     Perception     Praxis       Pertinent Vitals/Pain Pain Score: 7  Pain Location: L knee Pain Descriptors / Indicators: Sore Pain Intervention(s): Limited activity within patient's tolerance;Monitored during session;Premedicated before session;Repositioned;Ice applied     Hand Dominance     Extremity/Trunk Assessment Upper Extremity Assessment Upper Extremity Assessment: Overall WFL for tasks assessed           Communication Communication Communication: No difficulties   Cognition Arousal/Alertness: Awake/alert Behavior During Therapy: WFL for tasks assessed/performed Overall Cognitive Status: Within Functional Limits for tasks assessed                     General Comments       Exercises       Shoulder Instructions      Home Living Family/patient expects to be discharged to:: Private residence Living Arrangements: Spouse/significant other Available Help at Discharge: Family;Available 24 hours/day Type of Home: House             Bathroom Shower/Tub: Walk-in Soil scientist Toilet: Handicapped height  Home Equipment: Toilet riser;Walker - 2 wheels   Additional Comments: small stand up shower:  doesn't have a seat      Prior Functioning/Environment Level of Independence: Independent             OT Diagnosis: Generalized weakness;Acute pain   OT Problem List: Decreased strength;Decreased activity tolerance;Impaired balance (sitting and/or standing);Pain;Decreased knowledge of use of DME or AE   OT Treatment/Interventions: Self-care/ADL training;DME and/or AE instruction;Patient/family education;Balance training    OT Goals(Current goals can be found in the care plan section) Acute Rehab OT Goals Patient Stated Goal: to walk better OT Goal Formulation: With patient Time For Goal Achievement: 08/11/14 Potential to Achieve Goals: Good ADL Goals Pt Will Perform Grooming: with supervision;standing Pt Will Transfer to Toilet: with supervision;ambulating;bedside  commode Pt Will Perform Toileting - Clothing Manipulation and hygiene: with supervision;sit to/from stand Additional ADL Goal #1: pt will verbalize shower sequence vs. demonstrate with min guard  OT Frequency: Min 2X/week   Barriers to D/C:            Co-evaluation              End of Session    Activity Tolerance: Patient tolerated treatment well Patient left: in chair;with call bell/phone within reach   Time: 0752-0813 OT Time Calculation (min): 21 min Charges:  OT General Charges $OT Visit: 1 Procedure OT Evaluation $Initial OT Evaluation Tier I: 1 Procedure G-Codes:    Zitlaly Malson 08-12-14, 8:25 AM   Marica Otter, OTR/L [phone] 08/12/2014

## 2014-08-04 NOTE — Progress Notes (Signed)
Physical Therapy Treatment Patient Details Name: Ethan Robinson MRN: [id number] DOB: [birth year] Today's Date: 08/04/2014    History of Present Illness L TKA    PT Comments    POD # 1 am session.  Applied KI and assisted OOB to amb in hallway.  Slightly unsteady/shaky gait.  Returned to room to perform TKR TE's followed by ICE.   Follow Up Recommendations  Home health PT     Equipment Recommendations  None recommended by PT    Recommendations for Other Services       Precautions / Restrictions Precautions Precautions: Knee Precaution Comments: pt did SLR with PT Restrictions Weight Bearing Restrictions: No Other Position/Activity Restrictions: WBAT    Mobility  Bed Mobility Overal bed mobility: Needs Assistance Bed Mobility: Supine to Sit     Supine to sit: Min assist     General bed mobility comments: min A LLE OOB and increased time  Transfers Overall transfer level: Needs assistance Equipment used: Rolling walker (2 wheeled) Transfers: Sit to/from Stand Sit to Stand: Min assist         General transfer comment: 50% VC's on proper tech and hand placement  Ambulation/Gait Ambulation/Gait assistance: Min assist Ambulation Distance (Feet): 50 Feet Assistive device: Rolling walker (2 wheeled) Gait Pattern/deviations: Step-to pattern;Step-through pattern Gait velocity: decreased   General Gait Details: cues for positioning in RW and for sequencing, min/guard for balance   Stairs            Wheelchair Mobility    Modified Rankin (Stroke Patients Only)       Balance                                    Cognition Arousal/Alertness: Awake/alert Behavior During Therapy: WFL for tasks assessed/performed Overall Cognitive Status: Within Functional Limits for tasks assessed                      Exercises   Total Knee Replacement TE's 10 reps B LE ankle pumps 10 reps towel squeezes 10 reps knee presses 10 reps heel  slides  10 reps SAQ's 10 reps SLR's 10 reps ABD Followed by ICE     General Comments        Pertinent Vitals/Pain Pain Assessment: 0-10 Pain Score: 7  Pain Location: L knee Pain Descriptors / Indicators: Tender;Sore Pain Intervention(s): Monitored during session;Premedicated before session;Repositioned;Ice applied    Home Living                      Prior Function            PT Goals (current goals can now be found in the care plan section) Progress towards PT goals: Progressing toward goals    Frequency  7X/week    PT Plan      Co-evaluation             End of Session Equipment Utilized During Treatment: Gait belt;Left knee immobilizer Activity Tolerance: Patient tolerated treatment well Patient left: in chair;with call bell/phone within reach     Time: 1017-1042 PT Time Calculation (min) (ACUTE ONLY): 25 min  Charges:  $Gait Training: 8-22 mins $Therapeutic Exercise: 8-22 mins                    G Codes:      Felecia Shelling  PTA [facility]  Acute  Rehab Pager  [phone]  

## 2014-08-04 NOTE — Progress Notes (Signed)
   Subjective: 1 Day Post-Op Procedure(s) (LRB): LEFT TOTAL KNEE ARTHROPLASTY (Left) Patient reports pain as moderate.   Patient seen in rounds with Dr. Lequita Halt. Patient had a rough night with pain and not much rest. Patient is having problems with pain in the knee, requiring pain medications We will start therapy today and work on pain management. He walked 45 feet yesterday with therapy. Plan is to go Home after hospital stay.  Objective: Vital signs in last 24 hours: Temp:  [97.5 F (36.4 C)-98.7 F (37.1 C)] 98.7 F (37.1 C) (03/08 2025) Pulse Rate:  [56-75] 58 (03/08 0111) Resp:  [12-18] 16 (03/08 0638) BP: (112-137)/(50-91) 120/53 mmHg (03/08 0638) SpO2:  [94 %-100 %] 96 % (03/08 4270)  Intake/Output from previous day:  Intake/Output Summary (Last 24 hours) at 08/04/14 0917 Last data filed at 08/04/14 6237  Gross per 24 hour  Intake 4286.67 ml  Output   2735 ml  Net 1551.67 ml    Intake/Output this shift: UOP 850 since around [place]  Labs:  Recent Labs  08/04/14 0452  HGB 9.2*    Recent Labs  08/04/14 0452  WBC 9.9  RBC 2.72*  HCT 27.3*  PLT 159    Recent Labs  08/03/14 0640 08/04/14 0452  NA 134* 134*  K 4.1 3.6  CL 103 105  CO2 23 23  BUN 18 13  CREATININE 1.02 0.80  GLUCOSE 114* 138*  CALCIUM 9.0 8.1*   No results for input(s): LABPT, INR in the last 72 hours.  EXAM General - Patient is Alert, Appropriate and Oriented Extremity - Neurovascular intact Sensation intact distally Dorsiflexion/Plantar flexion intact Dressing - dressing C/D/I Motor Function - intact, moving foot and toes well on exam.  Hemovac pulled without difficulty.  Past Medical History  Diagnosis Date  . Diverticulosis 2004    [facility]; due 2014  . Other and unspecified hyperlipidemia   . CAD (coronary artery disease)     Dr Gala Romney  . Unspecified essential hypertension   . Anemia 2010    H/H  12.9/38  . GERD (gastroesophageal reflux disease)     NO MEDS    . RA (rheumatoid arthritis)     Dr Dierdre Forth  AND OA BOTH KNEES AND HANDS  . Movement disorder     essential tremors  . Asthma     hx of   . Depression     Assessment/Plan: 1 Day Post-Op Procedure(s) (LRB): LEFT TOTAL KNEE ARTHROPLASTY (Left) Principal Problem:   OA (osteoarthritis) of knee  Estimated body mass index is 28.7 kg/(m^2) as calculated from the following:   Height as of this encounter: 5\' 10"  (1.778 m).   Weight as of this encounter: 90.719 kg (200 lb). Advance diet Up with therapy Plan for discharge tomorrow Discharge home with home health  DVT Prophylaxis - Lovenox since he takes Primidone. Injections for one week and then switch to a 325 mg Aspirin. Weight-Bearing as tolerated to left leg.  He walked 45 feet yesterday with therapy. D/C O2 and Pulse OX and try on Room Air  , PA-C Orthopaedic Surgery 08/04/2014, 9:17 AM

## 2014-08-05 LAB — CBC
HCT: 26.9 % — ABNORMAL LOW (ref 39.0–52.0)
Hemoglobin: 9.2 g/dL — ABNORMAL LOW (ref 13.0–17.0)
MCH: 33.8 pg (ref 26.0–34.0)
MCHC: 34.2 g/dL (ref 30.0–36.0)
MCV: 98.9 fL (ref 78.0–100.0)
Platelets: 161 10*3/uL (ref 150–400)
RBC: 2.72 MIL/uL — AB (ref 4.22–5.81)
RDW: 13.5 % (ref 11.5–15.5)
WBC: 13 10*3/uL — AB (ref 4.0–10.5)

## 2014-08-05 LAB — BASIC METABOLIC PANEL
ANION GAP: 7 (ref 5–15)
BUN: 11 mg/dL (ref 6–23)
CO2: 23 mmol/L (ref 19–32)
Calcium: 8.1 mg/dL — ABNORMAL LOW (ref 8.4–10.5)
Chloride: 99 mmol/L (ref 96–112)
Creatinine, Ser: 0.76 mg/dL (ref 0.50–1.35)
GFR calc non Af Amer: 86 mL/min — ABNORMAL LOW (ref >90)
Glucose, Bld: 134 mg/dL — ABNORMAL HIGH (ref 70–99)
Potassium: 3.7 mmol/L (ref 3.5–5.1)
Sodium: 129 mmol/L — ABNORMAL LOW (ref 135–145)

## 2014-08-05 MED ORDER — ENOXAPARIN SODIUM 40 MG/0.4ML ~~LOC~~ SOLN
40.0000 mg | SUBCUTANEOUS | Status: DC
  Administered 2014-08-05: 40 mg via SUBCUTANEOUS
  Filled 2014-08-05 (×2): qty 0.4

## 2014-08-05 MED ORDER — ENOXAPARIN SODIUM 40 MG/0.4ML ~~LOC~~ SOLN: 40.0000 mg | SUBCUTANEOUS | Status: DC

## 2014-08-05 MED ORDER — ENOXAPARIN SODIUM 40 MG/0.4ML ~~LOC~~ SOLN
40.0000 mg | SUBCUTANEOUS | Status: DC
  Administered 2014-08-06: 40 mg via SUBCUTANEOUS
  Filled 2014-08-05 (×2): qty 0.4

## 2014-08-05 MED ORDER — OXYCODONE HCL 5 MG PO TABS: 5.0000 mg | ORAL_TABLET | ORAL | Status: DC | PRN

## 2014-08-05 MED ORDER — METHOCARBAMOL 500 MG PO TABS: 500.0000 mg | ORAL_TABLET | Freq: Four times a day (QID) | ORAL | Status: DC | PRN

## 2014-08-05 MED ORDER — ENOXAPARIN SODIUM 40 MG/0.4ML ~~LOC~~ SOLN: 40.0000 mg | Freq: Two times a day (BID) | SUBCUTANEOUS | Status: DC

## 2014-08-05 NOTE — Progress Notes (Signed)
Physical Therapy Treatment Patient Details Name: Ethan Robinson MRN: [id number] DOB: May 23, 1935 Today's Date: 08/05/2014    History of Present Illness L TKA    PT Comments    POD # 2 am pt OOB in recliner with spouse in room.  Demonstrated and instructed spouse on KI use for amb/stairs and proper application.  Had spouse hands on assist pt out of recliner to amb.  Pt demonstrated increased difficulty to perform self rise and had x 1 posterior LOB into chair with first attempt.  Re directed body positioning with cueing pt did stand but required Mod/Min asist.  Had spouse amb pt in hallway.  Pt still unsteady/shaky.  At this point spouse stated concern about helping pt at home.  Next, performed 4 steps one rail/one crutch.  Pt had great difficulty performing stair.  R LE is weak and buckled with a near fall descending first step.   At this point, discussed ST Rehab at [facility].  Spouse is very agreeable.  Pt is reluctant but agreeable.  Pt wants to know more info about cost.  Reported to RN pt plans fo D/C SNF.  Follow Up Recommendations  SNF     Equipment Recommendations  None recommended by PT    Recommendations for Other Services       Precautions / Restrictions Precautions Precautions: Knee Precaution Comments: instructed spouse on KI use and proper application Restrictions Weight Bearing Restrictions: No Other Position/Activity Restrictions: WBAT    Mobility  Bed Mobility   Bed Mobility: Sit to Supine     Supine to sit: Min assist     General bed mobility comments: pt OOB in recliner  Transfers Overall transfer level: Needs assistance Equipment used: Rolling walker (2 wheeled) Transfers: Sit to/from Stand Sit to Stand: Min assist;Mod assist         General transfer comment: cues for hand and leg placement.  Assisted LLE to slide forward when sitting.  75% VC's on safety with turns and backward gait. increased difficulty self rising and X 1 posterior LOB with  initial sit to stand.  Ambulation/Gait Ambulation/Gait assistance: Min assist Ambulation Distance (Feet): 35 Feet Assistive device: Rolling walker (2 wheeled) Gait Pattern/deviations: Step-to pattern;Trunk flexed Gait velocity: decreased   General Gait Details: cues for positioning in RW and for sequencing.  Poor posture with R lean.  Very unsteady/shaky gait.     Stairs Stairs: Yes Stairs assistance: Max assist Stair Management: One rail Left;Step to pattern;Backwards;With crutches Number of Stairs: 4 General stair comments: performed stair training with spouse present.  Pt performed poorly and dah near fall with therapist while stepping down.  opposite LE appears weak (s/p R TKR) with great diffuculty supporting safely.    Wheelchair Mobility    Modified Rankin (Stroke Patients Only)       Balance                                    Cognition Arousal/Alertness: Awake/alert Behavior During Therapy: WFL for tasks assessed/performed Overall Cognitive Status: Within Functional Limits for tasks assessed                      Exercises      General Comments        Pertinent Vitals/Pain Pain Assessment: 0-10 Pain Score: 5  Pain Location: L knee Pain Descriptors / Indicators: Aching;Sore;Tender Pain Intervention(s): Monitored during session;Premedicated before session;Repositioned;Ice applied  Home Living                      Prior Function            PT Goals (current goals can now be found in the care plan section) Acute Rehab PT Goals Patient Stated Goal: to walk better Progress towards PT goals: Progressing toward goals    Frequency  7X/week    PT Plan      Co-evaluation             End of Session Equipment Utilized During Treatment: Gait belt;Left knee immobilizer Activity Tolerance: Patient limited by fatigue;Patient limited by pain Patient left: in chair;with call bell/phone within reach     Time:  1140-1230 PT Time Calculation (min) (ACUTE ONLY): 50 min  Charges:  $Gait Training: 23-37 mins $Therapeutic Activity: 8-22 mins                    G Codes:      Felecia Shelling  PTA [facility]  Acute  Rehab Pager      [phone]  Discussed change in D/C plans to LPT.

## 2014-08-05 NOTE — Progress Notes (Signed)
Clinical Social Work Department CLINICAL SOCIAL WORK PLACEMENT NOTE 08/05/2014  Patient:  Ethan Robinson, Ethan Robinson  Account Number:  [id number] Admit date:  08/03/2014  Clinical Social Worker:  Cori Razor, LCSW  Date/time:  08/05/2014 04:05 PM  Clinical Social Work is seeking post-discharge placement for this patient at the following level of care:   SKILLED NURSING   (*CSW will update this form in Epic as items are completed)   08/05/2014  Patient/family provided with [facility] Department of Clinical Social Work's list of facilities offering this level of care within the geographic area requested by the patient (or if unable, by the patient's family).  08/05/2014  Patient/family informed of their freedom to choose among providers that offer the needed level of care, that participate in Medicare, Medicaid or managed care program needed by the patient, have an available bed and are willing to accept the patient.    Patient/family informed of [facility]' ownership interest in [facility], as well as of the fact that they are under no obligation to receive care at this facility.  PASARR submitted to EDS on 08/05/2014 PASARR number received on 08/05/2014  FL2 transmitted to all facilities in geographic area requested by pt/family on  08/05/2014 FL2 transmitted to all facilities within larger geographic area on   Patient informed that his/her managed care company has contracts with or will negotiate with  certain facilities, including the following:     Patient/family informed of bed offers received:   Patient chooses bed at  Physician recommends and patient chooses bed at    Patient to be transferred to  on   Patient to be transferred to facility by  Patient and family notified of transfer on  Name of family member notified:    The following physician request were entered in Epic:   Additional Comments:  Cori Razor LCSW [phone]

## 2014-08-05 NOTE — Discharge Instructions (Addendum)
° °Dr. Frank Aluisio °Total Joint Specialist °[facility] °[address] °[place], [place] [address] °[phone] ° °TOTAL KNEE REPLACEMENT POSTOPERATIVE DIRECTIONS ° ° ° °Knee Rehabilitation, Guidelines Following Surgery  °Results after knee surgery are often greatly improved when you follow the exercise, range of motion and muscle strengthening exercises prescribed by your doctor. Safety measures are also important to protect the knee from further injury. Any time any of these exercises cause you to have increased pain or swelling in your knee joint, decrease the amount until you are comfortable again and slowly increase them. If you have problems or questions, call your caregiver or physical therapist for advice.  ° °HOME CARE INSTRUCTIONS  °Remove items at home which could result in a fall. This includes throw rugs or furniture in walking pathways.  °Continue medications as instructed at time of discharge. °You may have some home medications which will be placed on hold until you complete the course of blood thinner medication.  °You may start showering once you are discharged home but do not submerge the incision under water. Just pat the incision dry and apply a dry gauze dressing on daily. °Walk with walker as instructed.  °You may resume a sexual relationship in one month or when given the OK by  your doctor.  °· Use walker as long as suggested by your caregivers. °· Avoid periods of inactivity such as sitting longer than an hour when not asleep. This helps prevent blood clots.  °You may put full weight on your legs and walk as much as is comfortable.  °You may return to work once you are cleared by your doctor.  °Do not drive a car for 6 weeks or until released by you surgeon.  °· Do not drive while taking narcotics.  °Wear the elastic stockings for three weeks following surgery during the day but you may remove then at night. °Make sure you keep all of your appointments after your  operation with all of your doctors and caregivers. You should call the office at the above phone number and make an appointment for approximately two weeks after the date of your surgery. °Change the dressing daily and reapply a dry dressing each time. °Please pick up a stool softener and laxative for home use as long as you are requiring pain medications. °· ICE to the affected knee every three hours for 30 minutes at a time and then as needed for pain and swelling.  Continue to use ice on the knee for pain and swelling from surgery. You may notice swelling that will progress down to the foot and ankle.  This is normal after surgery.  Elevate the leg when you are not up walking on it.   °It is important for you to complete the blood thinner medication as prescribed by your doctor. °· Continue to use the breathing machine which will help keep your temperature down.  It is common for your temperature to cycle up and down following surgery, especially at night when you are not up moving around and exerting yourself.  The breathing machine keeps your lungs expanded and your temperature down. ° °RANGE OF MOTION AND STRENGTHENING EXERCISES  °Rehabilitation of the knee is important following a knee injury or an operation. After just a few days of immobilization, the muscles of the thigh which control the knee become weakened and shrink (atrophy). Knee exercises are designed to build up the tone and strength of the thigh muscles and to improve knee   motion. Often times heat used for twenty to thirty minutes before working out will loosen up your tissues and help with improving the range of motion but do not use heat for the first two weeks following surgery. These exercises can be done on a training (exercise) mat, on the floor, on a table or on a bed. Use what ever works the best and is most comfortable for you Knee exercises include:  Leg Lifts - While your knee is still immobilized in a splint or cast, you can do  straight leg raises. Lift the leg to 60 degrees, hold for 3 sec, and slowly lower the leg. Repeat 10-20 times 2-3 times daily. Perform this exercise against resistance later as your knee gets better.  Quad and Hamstring Sets - Tighten up the muscle on the front of the thigh (Quad) and hold for 5-10 sec. Repeat this 10-20 times hourly. Hamstring sets are done by pushing the foot backward against an object and holding for 5-10 sec. Repeat as with quad sets.  A rehabilitation program following serious knee injuries can speed recovery and prevent re-injury in the future due to weakened muscles. Contact your doctor or a physical therapist for more information on knee rehabilitation.   SKILLED REHAB INSTRUCTIONS: If the patient is transferred to a skilled rehab facility following release from the hospital, a list of the current medications will be sent to the facility for the patient to continue.  When discharged from the skilled rehab facility, please have the facility set up the patient's Home Health Physical Therapy prior to being released. Also, the skilled facility will be responsible for providing the patient with their medications at time of release from the facility to include their pain medication, the muscle relaxants, and their blood thinner medication. If the patient is still at the rehab facility at time of the two week follow up appointment, the skilled rehab facility will also need to assist the patient in arranging follow up appointment in our office and any transportation needs.  MAKE SURE YOU:  Understand these instructions.  Will watch your condition.  Will get help right away if you are not doing well or get worse.    Pick up stool softner and laxative for home use following surgery while on pain medications. Do not submerge incision under water. Please use good hand washing techniques while changing dressing each day. May shower starting three days after surgery. Please use a clean  towel to pat the incision dry following showers. Continue to use ice for pain and swelling after surgery. Do not use any lotions or creams on the incision until instructed by your surgeon.  Take Lovenox injections for six days and then resume the home 325 mg Aspirin dosing.  When discharged from the skilled rehab facility, please have the facility set up the patient's Home Health Physical Therapy prior to being released.   Also provide the patient with their medications at time of release from the facility to include their pain medication, the muscle relaxants, and their blood thinner medication.  If the patient is still at the rehab facility at time of follow up appointment, please also assist the patient in arranging follow up appointment in our office and any transportation needs. ICE to the affected knee or hip every three hours for 30 minutes at a time and then as needed for pain and swelling.   Postoperative Constipation Protocol  Constipation - defined medically as fewer than three stools per week and severe constipation  as less than one stool per week.  One of the most common issues patients have following surgery is constipation.  Even if you have a regular bowel pattern at home, your normal regimen is likely to be disrupted due to multiple reasons following surgery.  Combination of anesthesia, postoperative narcotics, change in appetite and fluid intake all can affect your bowels.  In order to avoid complications following surgery, here are some recommendations in order to help you during your recovery period.  Colace (docusate) - Pick up an over-the-counter form of Colace or another stool softener and take twice a day as long as you are requiring postoperative pain medications.  Take with a full glass of water daily.  If you experience loose stools or diarrhea, hold the colace until you stool forms back up.  If your symptoms do not get better within 1 week or if they get worse, check with  your doctor.  Dulcolax (bisacodyl) - Pick up over-the-counter and take as directed by the product packaging as needed to assist with the movement of your bowels.  Take with a full glass of water.  Use this product as needed if not relieved by Colace only.   MiraLax (polyethylene glycol) - Pick up over-the-counter to have on hand.  MiraLax is a solution that will increase the amount of water in your bowels to assist with bowel movements.  Take as directed and can mix with a glass of water, juice, soda, coffee, or tea.  Take if you go more than two days without a movement. Do not use MiraLax more than once per day. Call your doctor if you are still constipated or irregular after using this medication for 7 days in a row.  If you continue to have problems with postoperative constipation, please contact the office for further assistance and recommendations.  If you experience "the worst abdominal pain ever" or develop nausea or vomiting, please contact the office immediatly for further recommendations for treatment.

## 2014-08-05 NOTE — Discharge Summary (Signed)
Physician Discharge Summary   Patient ID: Ethan Robinson MRN: [id number] DOB/AGE: Jun 18, 1934 80 y.o.  Admit date: 08/03/2014 Discharge date: 08-06-2014  Primary Diagnosis:  Osteoarthritis Left knee(s)  Admission Diagnoses:  Past Medical History  Diagnosis Date  . Diverticulosis 2004    [facility]; due 2014  . Other and unspecified hyperlipidemia   . CAD (coronary artery disease)     Dr Haroldine Laws  . Unspecified essential hypertension   . Anemia 2010    H/H  12.9/38  . GERD (gastroesophageal reflux disease)     NO MEDS  . RA (rheumatoid arthritis)     Dr Amil Amen  AND OA BOTH KNEES AND HANDS  . Movement disorder     essential tremors  . Asthma     hx of   . Depression    Discharge Diagnoses:   Principal Problem:   OA (osteoarthritis) of knee  Estimated body mass index is 28.7 kg/(m^2) as calculated from the following:   Height as of this encounter: '5\' 10"'  (1.778 m).   Weight as of this encounter: 90.719 kg (200 lb).  Procedure:  Procedure(s) (LRB): LEFT TOTAL KNEE ARTHROPLASTY (Left)   Consults: None  HPI: Ethan Robinson is a 80 y.o. year old male with end stage OA of his left knee with progressively worsening pain and dysfunction. He has constant pain, with activity and at rest and significant functional deficits with difficulties even with ADLs. He has had extensive non-op management including analgesics, injections of cortisone and viscosupplements, and home exercise program, but remains in significant pain with significant dysfunction. Radiographs show bone on bone arthritis medial and patellofemoral. He presents now for left Total Knee Arthroplasty.   Laboratory Data: Admission on 08/03/2014  Component Date Value Ref Range Status  . Sodium 08/03/2014 134* 135 - 145 mmol/L Final  . Potassium 08/03/2014 4.1  3.5 - 5.1 mmol/L Final  . Chloride 08/03/2014 103  96 - 112 mmol/L Final  . CO2 08/03/2014 23  19 - 32 mmol/L Final  . Glucose, Bld 08/03/2014 114* 70 -  99 mg/dL Final  . BUN 08/03/2014 18  6 - 23 mg/dL Final  . Creatinine, Ser 08/03/2014 1.02  0.50 - 1.35 mg/dL Final  . Calcium 08/03/2014 9.0  8.4 - 10.5 mg/dL Final  . GFR calc non Af Amer 08/03/2014 69* >90 mL/min Final  . GFR calc Af Amer 08/03/2014 80* >90 mL/min Final   Comment: (NOTE) The eGFR has been calculated using the CKD EPI equation. This calculation has not been validated in all clinical situations. eGFR's persistently <90 mL/min signify possible Chronic Kidney Disease.   . Anion gap 08/03/2014 8  5 - 15 Final  . WBC 08/04/2014 9.9  4.0 - 10.5 K/uL Final  . RBC 08/04/2014 2.72* 4.22 - 5.81 MIL/uL Final  . Hemoglobin 08/04/2014 9.2* 13.0 - 17.0 g/dL Final  . HCT 08/04/2014 27.3* 39.0 - 52.0 % Final  . MCV 08/04/2014 100.4* 78.0 - 100.0 fL Final  . MCH 08/04/2014 33.8  26.0 - 34.0 pg Final  . MCHC 08/04/2014 33.7  30.0 - 36.0 g/dL Final  . RDW 08/04/2014 13.5  11.5 - 15.5 % Final  . Platelets 08/04/2014 159  150 - 400 K/uL Final  . Sodium 08/04/2014 134* 135 - 145 mmol/L Final  . Potassium 08/04/2014 3.6  3.5 - 5.1 mmol/L Final  . Chloride 08/04/2014 105  96 - 112 mmol/L Final  . CO2 08/04/2014 23  19 - 32 mmol/L Final  .  Glucose, Bld 08/04/2014 138* 70 - 99 mg/dL Final  . BUN 08/04/2014 13  6 - 23 mg/dL Final  . Creatinine, Ser 08/04/2014 0.80  0.50 - 1.35 mg/dL Final  . Calcium 08/04/2014 8.1* 8.4 - 10.5 mg/dL Final  . GFR calc non Af Amer 08/04/2014 84* >90 mL/min Final  . GFR calc Af Amer 08/04/2014 >90  >90 mL/min Final   Comment: (NOTE) The eGFR has been calculated using the CKD EPI equation. This calculation has not been validated in all clinical situations. eGFR's persistently <90 mL/min signify possible Chronic Kidney Disease.   . Anion gap 08/04/2014 6  5 - 15 Final  . WBC 08/05/2014 13.0* 4.0 - 10.5 K/uL Final  . RBC 08/05/2014 2.72* 4.22 - 5.81 MIL/uL Final  . Hemoglobin 08/05/2014 9.2* 13.0 - 17.0 g/dL Final  . HCT 08/05/2014 26.9* 39.0 - 52.0 %  Final  . MCV 08/05/2014 98.9  78.0 - 100.0 fL Final  . MCH 08/05/2014 33.8  26.0 - 34.0 pg Final  . MCHC 08/05/2014 34.2  30.0 - 36.0 g/dL Final  . RDW 08/05/2014 13.5  11.5 - 15.5 % Final  . Platelets 08/05/2014 161  150 - 400 K/uL Final  . Sodium 08/05/2014 129* 135 - 145 mmol/L Final  . Potassium 08/05/2014 3.7  3.5 - 5.1 mmol/L Final  . Chloride 08/05/2014 99  96 - 112 mmol/L Final  . CO2 08/05/2014 23  19 - 32 mmol/L Final  . Glucose, Bld 08/05/2014 134* 70 - 99 mg/dL Final  . BUN 08/05/2014 11  6 - 23 mg/dL Final  . Creatinine, Ser 08/05/2014 0.76  0.50 - 1.35 mg/dL Final  . Calcium 08/05/2014 8.1* 8.4 - 10.5 mg/dL Final  . GFR calc non Af Amer 08/05/2014 86* >90 mL/min Final  . GFR calc Af Amer 08/05/2014 >90  >90 mL/min Final   Comment: (NOTE) The eGFR has been calculated using the CKD EPI equation. This calculation has not been validated in all clinical situations. eGFR's persistently <90 mL/min signify possible Chronic Kidney Disease.   . Anion gap 08/05/2014 7  5 - 15 Final  . WBC 08/06/2014 10.7* 4.0 - 10.5 K/uL Final  . RBC 08/06/2014 2.86* 4.22 - 5.81 MIL/uL Final  . Hemoglobin 08/06/2014 9.6* 13.0 - 17.0 g/dL Final  . HCT 08/06/2014 28.2* 39.0 - 52.0 % Final  . MCV 08/06/2014 98.6  78.0 - 100.0 fL Final  . MCH 08/06/2014 33.6  26.0 - 34.0 pg Final  . MCHC 08/06/2014 34.0  30.0 - 36.0 g/dL Final  . RDW 08/06/2014 13.3  11.5 - 15.5 % Final  . Platelets 08/06/2014 193  150 - 400 K/uL Final  Hospital Outpatient Visit on 07/27/2014  Component Date Value Ref Range Status  . MRSA, PCR 07/27/2014 NEGATIVE  NEGATIVE Final  . Staphylococcus aureus 07/27/2014 NEGATIVE  NEGATIVE Final   Comment:        The Xpert SA Assay (FDA approved for NASAL specimens in patients over 60 years of age), is one component of a comprehensive surveillance program.  Test performance has been validated by [facility] for patients greater than or equal to 34 year old. It is not intended to  diagnose infection nor to guide or monitor treatment.   Marland Kitchen aPTT 07/27/2014 28  24 - 37 seconds Final  . WBC 07/27/2014 7.9  4.0 - 10.5 K/uL Final  . RBC 07/27/2014 3.55* 4.22 - 5.81 MIL/uL Final  . Hemoglobin 07/27/2014 11.9* 13.0 - 17.0 g/dL Final  .  HCT 07/27/2014 36.0* 39.0 - 52.0 % Final  . MCV 07/27/2014 101.4* 78.0 - 100.0 fL Final  . MCH 07/27/2014 33.5  26.0 - 34.0 pg Final  . MCHC 07/27/2014 33.1  30.0 - 36.0 g/dL Final  . RDW 07/27/2014 13.2  11.5 - 15.5 % Final  . Platelets 07/27/2014 256  150 - 400 K/uL Final  . Sodium 07/27/2014 136  135 - 145 mmol/L Final  . Potassium 07/27/2014 5.6* 3.5 - 5.1 mmol/L Final  . Chloride 07/27/2014 103  96 - 112 mmol/L Final  . CO2 07/27/2014 25  19 - 32 mmol/L Final  . Glucose, Bld 07/27/2014 113* 70 - 99 mg/dL Final  . BUN 07/27/2014 20  6 - 23 mg/dL Final  . Creatinine, Ser 07/27/2014 0.91  0.50 - 1.35 mg/dL Final  . Calcium 07/27/2014 9.9  8.4 - 10.5 mg/dL Final  . Total Protein 07/27/2014 7.6  6.0 - 8.3 g/dL Final  . Albumin 07/27/2014 4.6  3.5 - 5.2 g/dL Final  . AST 07/27/2014 38* 0 - 37 U/L Final  . ALT 07/27/2014 24  0 - 53 U/L Final  . Alkaline Phosphatase 07/27/2014 36* 39 - 117 U/L Final  . Total Bilirubin 07/27/2014 0.8  0.3 - 1.2 mg/dL Final  . GFR calc non Af Amer 07/27/2014 80* >90 mL/min Final  . GFR calc Af Amer 07/27/2014 >90  >90 mL/min Final   Comment: (NOTE) The eGFR has been calculated using the CKD EPI equation. This calculation has not been validated in all clinical situations. eGFR's persistently <90 mL/min signify possible Chronic Kidney Disease.   . Anion gap 07/27/2014 8  5 - 15 Final  . Prothrombin Time 07/27/2014 14.6  11.6 - 15.2 seconds Final  . INR 07/27/2014 1.12  0.00 - 1.49 Final  . ABO/RH(D) 07/27/2014 O POS   Final  . Antibody Screen 07/27/2014 NEG   Final  . Sample Expiration 07/27/2014 08/06/2014   Final  . Color, Urine 07/27/2014 YELLOW  YELLOW Final  . APPearance 07/27/2014 CLEAR  CLEAR  Final  . Specific Gravity, Urine 07/27/2014 1.018  1.005 - 1.030 Final  . pH 07/27/2014 6.0  5.0 - 8.0 Final  . Glucose, UA 07/27/2014 NEGATIVE  NEGATIVE mg/dL Final  . Hgb urine dipstick 07/27/2014 NEGATIVE  NEGATIVE Final  . Bilirubin Urine 07/27/2014 NEGATIVE  NEGATIVE Final  . Ketones, ur 07/27/2014 NEGATIVE  NEGATIVE mg/dL Final  . Protein, ur 07/27/2014 NEGATIVE  NEGATIVE mg/dL Final  . Urobilinogen, UA 07/27/2014 0.2  0.0 - 1.0 mg/dL Final  . Nitrite 07/27/2014 NEGATIVE  NEGATIVE Final  . Leukocytes, UA 07/27/2014 NEGATIVE  NEGATIVE Final   MICROSCOPIC NOT DONE ON URINES WITH NEGATIVE PROTEIN, BLOOD, LEUKOCYTES, NITRITE, OR GLUCOSE <1000 mg/dL.  Hospital Outpatient Visit on 07/10/2014  Component Date Value Ref Range Status  . FVC-Pre 07/10/2014 3.50   Final  . FVC-%Pred-Pre 07/10/2014 84   Final  . FVC-Post 07/10/2014 3.58   Final  . FVC-%Pred-Post 07/10/2014 86   Final  . FVC-%Change-Post 07/10/2014 2   Final  . FEV1-Pre 07/10/2014 2.83   Final  . FEV1-%Pred-Pre 07/10/2014 95   Final  . FEV1-Post 07/10/2014 2.96   Final  . FEV1-%Pred-Post 07/10/2014 100   Final  . FEV1-%Change-Post 07/10/2014 4   Final  . FEV6-Pre 07/10/2014 3.39   Final  . FEV6-%Pred-Pre 07/10/2014 87   Final  . FEV6-Post 07/10/2014 3.57   Final  . FEV6-%Pred-Post 07/10/2014 92   Final  . FEV6-%Change-Post 07/10/2014 5  Final  . Pre FEV1/FVC ratio 07/10/2014 81   Final  . FEV1FVC-%Pred-Pre 07/10/2014 112   Final  . Post FEV1/FVC ratio 07/10/2014 83   Final  . FEV1FVC-%Change-Post 07/10/2014 2   Final  . Pre FEV6/FVC Ratio 07/10/2014 99   Final  . FEV6FVC-%Pred-Pre 07/10/2014 106   Final  . Post FEV6/FVC ratio 07/10/2014 100   Final  . FEV6FVC-%Pred-Post 07/10/2014 107   Final  . FEV6FVC-%Change-Post 07/10/2014 1   Final  . FEF 25-75 Pre 07/10/2014 3.28   Final  . FEF2575-%Pred-Pre 07/10/2014 156   Final  . FEF 25-75 Post 07/10/2014 3.67   Final  . FEF2575-%Pred-Post 07/10/2014 175   Final  .  FEF2575-%Change-Post 07/10/2014 11   Final  . RV 07/10/2014 2.07   Final  . RV % pred 07/10/2014 79   Final  . TLC 07/10/2014 5.94   Final  . TLC % pred 07/10/2014 84   Final  . DLCO unc 07/10/2014 18.50   Final  . DLCO unc % pred 07/10/2014 57   Final  . DL/VA 07/10/2014 3.32   Final  . DL/VA % pred 07/10/2014 72   Final     X-Rays:No results found.  EKG: Orders placed or performed during the hospital encounter of 07/06/14  . EKG 12-Lead  . EKG 12-Lead     Hospital Course: Ethan Robinson is a 80 y.o. who was admitted to [facility]. They were brought to the operating room on 08/03/2014 and underwent Procedure(s): LEFT TOTAL KNEE ARTHROPLASTY.  Patient tolerated the procedure well and was later transferred to the recovery room and then to the orthopaedic floor for postoperative care.  They were given PO and IV analgesics for pain control following their surgery.  They were given 24 hours of postoperative antibiotics of  Anti-infectives    Start     Dose/Rate Route Frequency Ordered Stop   08/03/14 1400  ceFAZolin (ANCEF) IVPB 2 g/50 mL premix     2 g 100 mL/hr over 30 Minutes Intravenous Every 6 hours 08/03/14 1056 08/03/14 2133   08/03/14 0618  ceFAZolin (ANCEF) IVPB 2 g/50 mL premix     2 g 100 mL/hr over 30 Minutes Intravenous On call to O.R. 08/03/14 9628 08/03/14 3662     and started on DVT prophylaxis in the form of Lovenox since he takes Primidone. Injections  Scheduled for one week following discharge and then switch to a 325 mg Aspirin. .   PT and OT were ordered for total joint protocol.  Discharge planning consulted to help with postop disposition and equipment needs.  Patient had a rough night on the evening of surgery.  They started to get up OOB with therapy on day one. Started therapy and worked on pain Mudlogger. He walked 45 feet the first day with therapy. Hemovac drain was pulled without difficulty.  Continued to work with therapy into day two.  Dressing  was changed on day two and the incision was healing well.  He was set up to go home on day two.  Unfortunately, he had a difficult day on day two with therapy. Became very unsteady on his feet and nearly fell.Discharge was held and Education officer, museum notified of possible need for placement.By day three, he was een in rounds with Dr. Wynelle Link and setup for skilled nursing facility.  Incision was healing well.  Patient was seen in rounds and planned for [facility].  Discharge to SNF Diet - Cardiac diet Follow up -  in 1 week, next Thursday 08/13/2014 Activity - WBAT Disposition - [facility] Condition Upon Discharge - Stable D/C Meds - See DC Summary DVT Prophylaxis - Lovenox since he takes Primidone. Injections for one week and then switch back to a 325 mg Aspirin.      Discharge Instructions    Call MD / Call 911    Complete by:  As directed   If you experience chest pain or shortness of breath, CALL 911 and be transported to the hospital emergency room.  If you develope a fever above 101 F, pus (white drainage) or increased drainage or redness at the wound, or calf pain, call your surgeon's office.     Change dressing    Complete by:  As directed   Change dressing daily with sterile 4 x 4 inch gauze dressing and apply TED hose. Do not submerge the incision under water.     Constipation Prevention    Complete by:  As directed   Drink plenty of fluids.  Prune juice may be helpful.  You may use a stool softener, such as Colace (over the counter) 100 mg twice a day.  Use MiraLax (over the counter) for constipation as needed.     Diet - low sodium heart healthy    Complete by:  As directed      Discharge instructions    Complete by:  As directed   Pick up stool softner and laxative for home use following surgery while on pain medications. Do not submerge incision under water. Please use good hand washing techniques while changing dressing each day. May shower starting  three days after surgery, starting Thursday 08/06/2014 Please use a clean towel to pat the incision dry following showers. Continue to use ice for pain and swelling after surgery. Do not use any lotions or creams on the incision until instructed by your surgeon.  Lovenox injections for six more days and then resume the home 325 mg aspirin.  Postoperative Constipation Protocol  Constipation - defined medically as fewer than three stools per week and severe constipation as less than one stool per week.  One of the most common issues patients have following surgery is constipation.  Even if you have a regular bowel pattern at home, your normal regimen is likely to be disrupted due to multiple reasons following surgery.  Combination of anesthesia, postoperative narcotics, change in appetite and fluid intake all can affect your bowels.  In order to avoid complications following surgery, here are some recommendations in order to help you during your recovery period.  Colace (docusate) - Pick up an over-the-counter form of Colace or another stool softener and take twice a day as long as you are requiring postoperative pain medications.  Take with a full glass of water daily.  If you experience loose stools or diarrhea, hold the colace until you stool forms back up.  If your symptoms do not get better within 1 week or if they get worse, check with your doctor.  Dulcolax (bisacodyl) - Pick up over-the-counter and take as directed by the product packaging as needed to assist with the movement of your bowels.  Take with a full glass of water.  Use this product as needed if not relieved by Colace only.   MiraLax (polyethylene glycol) - Pick up over-the-counter to have on hand.  MiraLax is a solution that will increase the amount of water in your bowels to assist with bowel movements.  Take as  directed and can mix with a glass of water, juice, soda, coffee, or tea.  Take if you go more than two days without a  movement. Do not use MiraLax more than once per day. Call your doctor if you are still constipated or irregular after using this medication for 7 days in a row.  If you continue to have problems with postoperative constipation, please contact the office for further assistance and recommendations.  If you experience "the worst abdominal pain ever" or develop nausea or vomiting, please contact the office immediatly for further recommendations for treatment.  When discharged from the skilled rehab facility, please have the facility set up the patient's [place] prior to being released.   Also provide the patient with their medications at time of release from the facility to include their pain medication, the muscle relaxants, and their blood thinner medication.  If the patient is still at the rehab facility at time of follow up appointment, please also assist the patient in arranging follow up appointment in our office and any transportation needs. ICE to the affected knee or hip every three hours for 30 minutes at a time and then as needed for pain and swelling.     Do not put a pillow under the knee. Place it under the heel.    Complete by:  As directed      Do not sit on low chairs, stoools or toilet seats, as it may be difficult to get up from low surfaces    Complete by:  As directed      Driving restrictions    Complete by:  As directed   No driving until released by the physician.     Increase activity slowly as tolerated    Complete by:  As directed      Lifting restrictions    Complete by:  As directed   No lifting until released by the physician.     Patient may shower    Complete by:  As directed   You may shower without a dressing once there is no drainage.  Do not wash over the wound.  If drainage remains, do not shower until drainage stops.     TED hose    Complete by:  As directed   Use stockings (TED hose) for 3 weeks on both leg(s).  You may remove them at  night for sleeping.     Weight bearing as tolerated    Complete by:  As directed   Laterality:  left  Extremity:  Lower            Medication List    STOP taking these medications        aspirin 325 MG tablet     CALCIUM 600 + D PO     CO Q 10 PO     folic acid 1 MG tablet  Commonly known as:  FOLVITE     Ginkgo Biloba 200 MG Caps     hydroxychloroquine 200 MG tablet  Commonly known as:  PLAQUENIL     KRILL OIL PO     methotrexate 25 MG/ML injection     multivitamin with minerals tablet     VITAMIN B 12 PO      TAKE these medications        acetaminophen 500 MG tablet  Commonly known as:  TYLENOL  Take 1,000 mg by mouth every 6 (six) hours as needed for moderate pain or headache.  bisacodyl 10 MG suppository  Commonly known as:  DULCOLAX  Place 1 suppository (10 mg total) rectally daily as needed for moderate constipation.     docusate sodium 100 MG capsule  Commonly known as:  COLACE  Take 1 capsule (100 mg total) by mouth 2 (two) times daily.     enoxaparin 40 MG/0.4ML injection  Commonly known as:  LOVENOX  Inject 0.4 mLs (40 mg total) into the skin daily. Take daily for six days starting Friday 08/07/2014.  After six days, stop the injections and resume the home 325 mg Aspirin.     HYDROcodone-acetaminophen 5-325 MG per tablet  Commonly known as:  NORCO/VICODIN  Take 1-2 tablets by mouth every 4 (four) hours as needed for moderate pain.     methocarbamol 500 MG tablet  Commonly known as:  ROBAXIN  Take 1 tablet (500 mg total) by mouth every 6 (six) hours as needed for muscle spasms.     metoCLOPramide 5 MG tablet  Commonly known as:  REGLAN  Take 1 tablet (5 mg total) by mouth every 8 (eight) hours as needed for nausea (if ondansetron (ZOFRAN) ineffective.).     metoprolol succinate 25 MG 24 hr tablet  Commonly known as:  TOPROL-XL  TAKE ONE TABLET BY MOUTH ONCE DAILY     ondansetron 4 MG tablet  Commonly known as:  ZOFRAN  Take 1 tablet  (4 mg total) by mouth every 6 (six) hours as needed for nausea.     PARoxetine 20 MG tablet  Commonly known as:  PAXIL  TAKE ONE TABLET BY MOUTH ONCE DAILY     polyethylene glycol packet  Commonly known as:  MIRALAX / GLYCOLAX  Take 17 g by mouth daily as needed for mild constipation.     predniSONE 5 MG tablet  Commonly known as:  DELTASONE  Take 5 mg by mouth every morning.     primidone 50 MG tablet  Commonly known as:  MYSOLINE  Take 2 tablets (100 mg total) by mouth 2 (two) times daily.     rosuvastatin 20 MG tablet  Commonly known as:  CRESTOR  Take 1 tablet (20 mg total) by mouth at bedtime.     sulfaSALAzine 500 MG tablet  Commonly known as:  AZULFIDINE  Take 1,000 mg by mouth 2 (two) times daily.     SYSTANE 0.4-0.3 % Soln  Generic drug:  Polyethyl Glycol-Propyl Glycol  Apply 1 drop to eye 2 (two) times daily as needed (dry eyes).       Follow-up Information    Follow up with [facility].   Why:  home health physical therapy   Contact information:   [place] [address] [phone]       Follow up with Gearlean Alf, MD.   Specialty:  Orthopedic Surgery   Why:  Call office ASAP at [phone] to set up appointment on Thursday 08/13/2014 with Dr. Wynelle Link.   Contact information:   [address] [place] [address] [phone]       Signed: Arlee Muslim, PA-C Orthopaedic Surgery 08/06/2014, 8:22 AM

## 2014-08-05 NOTE — Progress Notes (Signed)
Physical Therapy Treatment Patient Details Name: Ethan Robinson MRN: [id number] DOB: 05/03/1935 Today's Date: 08/05/2014    History of Present Illness L TKA    PT Comments    POD # 2 pm session.  Pt had great difficulty self rising out of recliner.  Posterior LOB twice.  Required MAX assist to rise and increased time to correct balance.  amb in hallway.  Still present with severe R lean/shaky/unsteady gait.  HIGH FALL RISK.  Assisted back to bed with much assist.  Stand to sit required control from therapist.  L LE advancement prior to sit required assist from therapist.  Rigid throughout.  Positioned in bed to comfort.    Follow Up Recommendations  SNF     Equipment Recommendations  None recommended by PT    Recommendations for Other Services       Precautions / Restrictions Precautions Precautions: Knee Precaution Comments: instructed spouse on KI use and proper application Restrictions Weight Bearing Restrictions: No Other Position/Activity Restrictions: WBAT    Mobility  Bed Mobility Overal bed mobility: Needs Assistance Bed Mobility: Sit to Supine       Sit to supine: Max assist;Mod assist   General bed mobility comments: Mod/Max assist back to bed.  use of trapeze and rails.  Transfers Overall transfer level: Needs assistance Equipment used: Rolling walker (2 wheeled) Transfers: Sit to/from Stand Sit to Stand: Min assist;Mod assist         General transfer comment: still shaky and unsteady with right lean that is a concern for falls.  50% VC's on safety with turn completion and difficulty safely stepping backward without LOB.  Rigid.  Poor self corrective reaction time.  HIGH FALL RISK.  Ambulation/Gait Ambulation/Gait assistance: Min assist Ambulation Distance (Feet): 28 Feet Assistive device: Rolling walker (2 wheeled) Gait Pattern/deviations: Step-to pattern;Trunk flexed Gait velocity: decreased   General Gait Details: cues for positioning in RW  and for sequencing.  Poor posture with R lean.  Very unsteady/shaky gait.     Stairs Stairs: Yes Stairs assistance: Max assist Stair Management: One rail Left;Step to pattern;Backwards;With crutches Number of Stairs: 4 General stair comments: performed stair training with spouse present.  Pt performed poorly and dah near fall with therapist while stepping down.  opposite LE appears weak (s/p R TKR) with great diffuculty supporting safely.    Wheelchair Mobility    Modified Rankin (Stroke Patients Only)       Balance                                    Cognition Arousal/Alertness: Awake/alert Behavior During Therapy: WFL for tasks assessed/performed Overall Cognitive Status: Within Functional Limits for tasks assessed                      Exercises      General Comments        Pertinent Vitals/Pain Pain Assessment: 0-10 Pain Score: 5  Pain Location: L knee Pain Descriptors / Indicators: Aching;Sore;Tender Pain Intervention(s): Monitored during session;Premedicated before session;Repositioned;Ice applied    Home Living                      Prior Function            PT Goals (current goals can now be found in the care plan section) Progress towards PT goals: Progressing toward goals    Frequency  7X/week    PT Plan      Co-evaluation             End of Session Equipment Utilized During Treatment: Gait belt;Left knee immobilizer Activity Tolerance: Patient limited by fatigue;Patient limited by pain Patient left: in chair;with call bell/phone within reach     Time: 1140-1230 PT Time Calculation (min) (ACUTE ONLY): 50 min  Charges:  $Gait Training: 23-37 mins $Therapeutic Activity: 8-22 mins                    G Codes:      Felecia Shelling  PTA [facility]  Acute  Rehab Pager      [phone]

## 2014-08-05 NOTE — Progress Notes (Signed)
Clinical Social Work Department BRIEF PSYCHOSOCIAL ASSESSMENT 08/05/2014  Patient:  Ethan Robinson, Ethan Robinson     Account Number:  [id number]     Admit date:  08/03/2014  Clinical Social Worker:  Lacie Scotts  Date/Time:  08/05/2014 03:00 PM  Referred by:  Physician  Date Referred:  08/05/2014 Referred for  SNF Placement   Other Referral:   Interview type:  Patient Other interview type:    PSYCHOSOCIAL DATA Living Status:  WIFE Admitted from facility:   Level of care:   Primary support name:  Hassan Rowan Primary support relationship to patient:  SPOUSE Degree of support available:   supportive    CURRENT CONCERNS Current Concerns  Post-Acute Placement   Other Concerns:    SOCIAL WORK ASSESSMENT / PLAN Pt is a 80 yr old gentleman living at home prior to hospitalization. CSW met with pt / spouse to assist with d/c planning. This is a planned admission. PT has recommended ST rehab following hospital d/c. Pt / spouse are in agreement with this plan. SNF search has been initiated and bed offers are pending. CSW will continue to follow to assist with d/c planning.   Assessment/plan status:  Psychosocial Support/Ongoing Assessment of Needs Other assessment/ plan:   Information/referral to community resources:   Insurance coverage for SNF and ambulance transport reviewed.    PATIENT'S/FAMILY'S RESPONSE TO PLAN OF CARE: " I was hoping to go home but it looks like I need rehab. " Pt has been motivated to work with therapy but his progress has been slow. Spouse is concerned that pt needs more assistance than she can provide at this time.    Werner Lean LCSW [phone]

## 2014-08-05 NOTE — Progress Notes (Signed)
   Subjective: 2 Days Post-Op Procedure(s) (LRB): LEFT TOTAL KNEE ARTHROPLASTY (Left) Patient reports pain as mild.   Patient seen in rounds with Dr. Lequita Halt. Patient is well, but has had some minor complaints of pain in the knee, requiring pain medications  Positive flatus. Patient is ready to go home  Objective: Vital signs in last 24 hours: Temp:  [98.2 F (36.8 C)-99.1 F (37.3 C)] 98.4 F (36.9 C) (03/09 0514) Pulse Rate:  [68-82] 82 (03/09 0514) Resp:  [16-18] 17 (03/09 0514) BP: (121-148)/(56-67) 148/67 mmHg (03/09 0514) SpO2:  [93 %-95 %] 95 % (03/09 0514)  Intake/Output from previous day:  Intake/Output Summary (Last 24 hours) at 08/05/14 0703 Last data filed at 08/05/14 0515  Gross per 24 hour  Intake 799.67 ml  Output    825 ml  Net -25.33 ml     Labs:  Recent Labs  08/04/14 0452 08/05/14 0430  HGB 9.2* 9.2*    Recent Labs  08/04/14 0452 08/05/14 0430  WBC 9.9 13.0*  RBC 2.72* 2.72*  HCT 27.3* 26.9*  PLT 159 161    Recent Labs  08/04/14 0452 08/05/14 0430  NA 134* 129*  K 3.6 3.7  CL 105 99  CO2 23 23  BUN 13 11  CREATININE 0.80 0.76  GLUCOSE 138* 134*  CALCIUM 8.1* 8.1*   No results for input(s): LABPT, INR in the last 72 hours.  EXAM: General - Patient is Alert, Appropriate and Oriented Extremity - Neurovascular intact Sensation intact distally Dorsiflexion/Plantar flexion intact Incision - clean, dry, no drainage Motor Function - intact, moving foot and toes well on exam.   Assessment/Plan: 2 Days Post-Op Procedure(s) (LRB): LEFT TOTAL KNEE ARTHROPLASTY (Left) Procedure(s) (LRB): LEFT TOTAL KNEE ARTHROPLASTY (Left) Past Medical History  Diagnosis Date  . Diverticulosis 2004    [facility]; due 2014  . Other and unspecified hyperlipidemia   . CAD (coronary artery disease)     Dr Gala Romney  . Unspecified essential hypertension   . Anemia 2010    H/H  12.9/38  . GERD (gastroesophageal reflux disease)     NO MEDS  . RA  (rheumatoid arthritis)     Dr Dierdre Forth  AND OA BOTH KNEES AND HANDS  . Movement disorder     essential tremors  . Asthma     hx of   . Depression    Principal Problem:   OA (osteoarthritis) of knee  Estimated body mass index is 28.7 kg/(m^2) as calculated from the following:   Height as of this encounter: 5\' 10"  (1.778 m).   Weight as of this encounter: 200 lb (90.719 kg). Up with therapy Discharge home with home health Diet - Cardiac diet Follow up - in 2 weeks Activity - WBAT Disposition - Home Condition Upon Discharge - improving D/C Meds - See DC Summary DVT Prophylaxis - Lovenox for seven days and then resume the 325 mg Aspirin  , PA-C Orthopaedic Surgery 08/05/2014, 7:03 AM

## 2014-08-05 NOTE — Plan of Care (Signed)
Problem: Consults Goal: Diagnosis- Total Joint Replacement Outcome: Completed/Met Date Met:  08/05/14 Primary Total Knee LEFT

## 2014-08-05 NOTE — Progress Notes (Signed)
Occupational Therapy Treatment Patient Details Name: BOLDEN HAGERMAN MRN: [id number] DOB: 08-16-1934 Today's Date: 08/05/2014    History of present illness L TKA   OT comments  Pt needs step by step cues:  Did not recall hand/leg placement.  Needed more assistance to scoot forward to EOB.    Follow Up Recommendations  Supervision/Assistance - 24 hour    Equipment Recommendations   (pt unable to step in shower yet; may want shower chair later)    Recommendations for Other Services      Precautions / Restrictions Precautions Precautions: Knee Precaution Comments: used KI--pt had difficulty with SLR this am when cleaning dressing off leg Restrictions Other Position/Activity Restrictions: WBAT       Mobility Bed Mobility   Bed Mobility: Sit to Supine     Supine to sit: Min assist     General bed mobility comments: min A for leg and also mod A to scoot to EOB  Transfers   Equipment used: Rolling walker (2 wheeled) Transfers: Sit to/from Stand Sit to Stand: Min assist         General transfer comment: cues for hand and leg placement.  Assisted LLE to slide forward when sitting    Balance                                   ADL Overall ADL's : Needs assistance/impaired     Grooming: Oral care;Modified independent;Standing                   Toilet Transfer: Minimal assistance;Ambulation;BSC             General ADL Comments: Pt had declined pain medication when RN was in room--requested this as pain increased with weight bearing.  Pt needed cues for pushing through arms to advance RLE.  Cues also for sit/stand transition.  Pt is not ready to perform shower transfer:  unable to verbalize sequence.  Will attempt to talk to wife later when she is here.      Vision                     Perception     Praxis      Cognition   Behavior During Therapy: WFL for tasks assessed/performed Overall Cognitive Status: Within Functional  Limits for tasks assessed                       Extremity/Trunk Assessment               Exercises     Shoulder Instructions       General Comments      Pertinent Vitals/ Pain       Pain Score: 8  Pain Location: L knee Pain Descriptors / Indicators: Aching Pain Intervention(s): Limited activity within patient's tolerance;Monitored during session;Premedicated before session;Repositioned;Ice applied;Patient requesting pain meds-RN notified  Home Living                                          Prior Functioning/Environment              Frequency Min 2X/week     Progress Toward Goals  OT Goals(current goals can now be found in the care plan section)  Progress towards OT goals: Progressing toward goals (  slowly)  Acute Rehab OT Goals Patient Stated Goal: to walk better OT Goal Formulation: With patient Time For Goal Achievement: 08/11/14  Plan      Co-evaluation                 End of Session     Activity Tolerance Patient tolerated treatment well   Patient Left in chair;with call bell/phone within reach   Nurse Communication Patient requests pain meds        Time: 0821-0848 OT Time Calculation (min): 27 min  Charges: OT General Charges $OT Visit: 1 Procedure OT Treatments $Self Care/Home Management : 23-37 mins  Demon Volante 08/05/2014, 9:16 AM Marica Otter, OTR/L [phone] 08/05/2014

## 2014-08-06 ENCOUNTER — Non-Acute Institutional Stay (SKILLED_NURSING_FACILITY): Payer: Medicare Other | Admitting: Internal Medicine

## 2014-08-06 DIAGNOSIS — R262 Difficulty in walking, not elsewhere classified: Secondary | ICD-10-CM | POA: Diagnosis not present

## 2014-08-06 DIAGNOSIS — D539 Nutritional anemia, unspecified: Secondary | ICD-10-CM | POA: Diagnosis not present

## 2014-08-06 DIAGNOSIS — I2510 Atherosclerotic heart disease of native coronary artery without angina pectoris: Secondary | ICD-10-CM | POA: Diagnosis not present

## 2014-08-06 DIAGNOSIS — T814XXA Infection following a procedure, initial encounter: Secondary | ICD-10-CM | POA: Diagnosis not present

## 2014-08-06 DIAGNOSIS — R509 Fever, unspecified: Secondary | ICD-10-CM | POA: Diagnosis not present

## 2014-08-06 DIAGNOSIS — M1712 Unilateral primary osteoarthritis, left knee: Secondary | ICD-10-CM | POA: Diagnosis not present

## 2014-08-06 DIAGNOSIS — Z471 Aftercare following joint replacement surgery: Secondary | ICD-10-CM | POA: Diagnosis not present

## 2014-08-06 DIAGNOSIS — M069 Rheumatoid arthritis, unspecified: Secondary | ICD-10-CM | POA: Diagnosis not present

## 2014-08-06 DIAGNOSIS — I119 Hypertensive heart disease without heart failure: Secondary | ICD-10-CM | POA: Diagnosis not present

## 2014-08-06 DIAGNOSIS — G250 Essential tremor: Secondary | ICD-10-CM | POA: Diagnosis not present

## 2014-08-06 DIAGNOSIS — Z96652 Presence of left artificial knee joint: Secondary | ICD-10-CM | POA: Diagnosis not present

## 2014-08-06 DIAGNOSIS — R05 Cough: Secondary | ICD-10-CM | POA: Diagnosis not present

## 2014-08-06 DIAGNOSIS — M6281 Muscle weakness (generalized): Secondary | ICD-10-CM | POA: Diagnosis not present

## 2014-08-06 DIAGNOSIS — E785 Hyperlipidemia, unspecified: Secondary | ICD-10-CM | POA: Diagnosis not present

## 2014-08-06 DIAGNOSIS — T8140XA Infection following a procedure, unspecified, initial encounter: Secondary | ICD-10-CM

## 2014-08-06 LAB — CBC
HEMATOCRIT: 28.2 % — AB (ref 39.0–52.0)
Hemoglobin: 9.6 g/dL — ABNORMAL LOW (ref 13.0–17.0)
MCH: 33.6 pg (ref 26.0–34.0)
MCHC: 34 g/dL (ref 30.0–36.0)
MCV: 98.6 fL (ref 78.0–100.0)
Platelets: 193 10*3/uL (ref 150–400)
RBC: 2.86 MIL/uL — ABNORMAL LOW (ref 4.22–5.81)
RDW: 13.3 % (ref 11.5–15.5)
WBC: 10.7 10*3/uL — AB (ref 4.0–10.5)

## 2014-08-06 MED ORDER — HYDROCODONE-ACETAMINOPHEN 5-325 MG PO TABS: 1.0000 | ORAL_TABLET | ORAL | Status: DC | PRN

## 2014-08-06 MED ORDER — ENOXAPARIN SODIUM 40 MG/0.4ML ~~LOC~~ SOLN: 40.0000 mg | SUBCUTANEOUS | Status: DC

## 2014-08-06 MED ORDER — POLYETHYLENE GLYCOL 3350 17 G PO PACK: 17.0000 g | PACK | Freq: Every day | ORAL | Status: DC | PRN

## 2014-08-06 MED ORDER — BISACODYL 10 MG RE SUPP: 10.0000 mg | Freq: Every day | RECTAL | Status: DC | PRN

## 2014-08-06 MED ORDER — ONDANSETRON HCL 4 MG PO TABS: 4.0000 mg | ORAL_TABLET | Freq: Four times a day (QID) | ORAL | Status: DC | PRN

## 2014-08-06 MED ORDER — METHOCARBAMOL 500 MG PO TABS: 500.0000 mg | ORAL_TABLET | Freq: Four times a day (QID) | ORAL | Status: DC | PRN

## 2014-08-06 MED ORDER — METOCLOPRAMIDE HCL 5 MG PO TABS: 5.0000 mg | ORAL_TABLET | Freq: Three times a day (TID) | ORAL | Status: DC | PRN

## 2014-08-06 MED ORDER — DOCUSATE SODIUM 100 MG PO CAPS: 100.0000 mg | ORAL_CAPSULE | Freq: Two times a day (BID) | ORAL | Status: DC

## 2014-08-06 NOTE — Progress Notes (Signed)
Patient ID: Ethan Robinson, male   DOB: 1935-04-30, 80 y.o.   MRN: [id number]   This is an acute visit.  Level care skilled.  Facility [facility].  Chief complaint acute visit status post hospitalization for left knee replacement status post end-stage osteoarthritis.  History of present illness.  Patient is a very pleasant 80 year old male with end-stage osteoarthritis of his left knee-he underwent a left knee replacement apparently without complication he did receive postop antibiotics with Ancef and started on DVT prophylaxis with Lovenox.  He is here for therapy.  Nursing staff has noted a low-grade temperature of 100.0 today-he does have some mild erythema at surgical site.  Otherwise he has no complaints.  Previous medical history.  End-stage left knee osteoarthritis status post replacement.  Coronary artery disease.  Hypertension.  Hyperlipidemia.  History of diverticulosis.  Anemia.  GERD.  Rheumatoid arthritis.  Movement disorder.  Asthma.  Depression.  Family medical social history is been reviewed per discharge summary on 08/06/2014.  Medications.  Tylenol thousand milligrams every 6 hours for pain or headache.  Dulcolax suppository daily when necessary.  Colace 100 mg twice a day.  Lovenox 40 mg daily.  Norco 05-325 milligrams every 4 hours when necessary moderate pain  Robaxin 500 mg every 6 hours when necessary muscle spasms.  Reglan 5 mg every 8 hours when necessary.  Toprol-XL 25 mg daily.   Zofran 4 mg every 6 hours when necessary.  Paxil 20 mg daily.  MiraLAX daily when necessary.  Prednisone 5 mg every morning.  Primidone 50 mg take 2 tabs equal 100 mg twice a day.  Crestor 20 mg daily at bedtime.  Sulfsalazine 1000 mg twice a day.  Systane eyedrops 1 drop twice a day as needed for dry eyes.  Review of systems.  General does not complain of fever chills.  Skin slight erythema at surgical site left knee otherwise  does not complain of any rashes or itching.  Head ears eyes nose mouth and throat does not complain of visual changes sore throat or nasal discharge.  Respiratory does not complain of shortness of breath or cough.  Cardiac no chest pain does not appear to have significant lower extremity edema does have some edema at surgical site left knee.  GI does not complain of nausea vomiting diarrhea constipation appears to have some history however of nausea dense does not complain of abdominal pain.  GU does not complain of dysuria burning with urination.  A school skeletal at this point left knee pain appears to be controlled does not really complain of acute joint pain.  Neurologic does not complain of dizziness or headache does have a history of movement disorder with tremors  Psych-history of depression but does not complain of overt depression or anxiety today.  Physical exam.  Temperature is 100.0 pulse is 90 respirations 19 blood pressure is119/60  In general this is a very pleasant elderly male in no distress sitting comfortably in his wheelchair.  Skin is warm and dry do note on surgical site Steri-Strips are in place there is some mild erythema extending bilaterally from the surgical site there is no drainage or bleeding there is some slight warmth here.  Eyes pupils appear reactive to light sclera and conjunctiva are clear visual acuity appears grossly intact.  Oropharynx clear mucous membranes moist.  Chest is clear to auscultation there is no labored breathing.  Heart is regular rate and rhythm without murmur gallop or rub.  Abdomen is soft nontender with  positive bowel sounds.  GU could not really appreciate suprapubic tenderness.  Musculoskeletal left knee surgical site as noted below strength appears to be intact other 3 extremities he does have a tremor  bilateral  upper extremities  Neurologic-grossly intact to speech is clear she does have a history of essential  tremors mostly also spoke upper extremities bilaterally otherwise neurologically appears grossly intact.  Speech is clear no lateralizing findings.  Psych he is alert and oriented very pleasant and appropriate  Labs.   08/06/2014.  WBC 10.7 it was 13.0 in hospital yesterday.  Hemoglobin is 9.6.  Platelets 193.  08/03/2014.  Sodium 134 potassium 4.1 BUN 18 creatinine 1.02.  Assessment and plan.  #1 history of left knee replacement this appears to be stable there appears to be some mild erythema at the surgical site since he does have a low-grade temperature will treat this with Keflex 500 mg twice a day-this will have to be monitored for any changes.  Also will obtain a CBC with differential and metabolic panel tomorrow.  Also will chest to check x-ray to rule out any pulmonary etiology.  He is on Lovenox for anticoagulation DVT prophylaxis.  He has Norco for pain when necessary as well as Robaxin for muscle spasms.  #2-history of essential tremor he is on primidone apparently this is long-term and at baseline.   #3 history of rheumatoid arthritis-he is on low-dose prednisone.  #4-history of depression he is on Paxil this appears to be quite stable currently.  #5-history of significant nausea-he does have Zofran as well as Reglan when necessary he does not complain of nausea today.  #6-history of constipation apparently this is been significant in the past he is on numerous agents when necessary including MiraLAX and Dulcolax suppository-he is on Colace twice a day routinely.  #7-hyperlipidemia he does continue on Crestor since his stay here will be relatively short will not be aggressive in pursuing a lipid panel.  #8-hypertension he is on metoprolol since he has just arrived in facility do not have many readings at this point will monitor will be seen by Dr. Lyn Hollingshead tomorrow  #9-history of anemia suspect this is postop wil need to t keep an eye on this again a lab is  ordered for tomorrow.   [id number] note greater than 45 minutes spent assessing patient-discussing his status with nursing staff-reviewing his chart-and coordinating and formulating a plan of care for numerous diagnoses-of note greater than 50% of time spent coordinating plan of care

## 2014-08-06 NOTE — Progress Notes (Signed)
Physical Therapy Treatment Patient Details Name: Ethan Robinson MRN: [id number] DOB: 03/13/1935 Today's Date: 08/06/2014    History of Present Illness L TKA    PT Comments    POD # 3 am session.  NT assisted pt to BR.    Follow Up Recommendations  SNF ([facility])     Equipment Recommendations       Recommendations for Other Services       Precautions / Restrictions Precautions Precautions: Knee Precaution Comments: pt unable to perform active SLR so KI is still needed Restrictions Weight Bearing Restrictions: No Other Position/Activity Restrictions: WBAT    Mobility  Bed Mobility               General bed mobility comments: Pt OOB in bathroom   Transfers Overall transfer level: Needs assistance Equipment used: Rolling walker (2 wheeled) Transfers: Sit to/from Stand Sit to Stand: Mod assist         General transfer comment: pt required Mod assist to get up from commode with difficulty and poor posterior LOB.  Rigid. poor self corrective reation and posture.  HIGH FALL RISK.   Ambulation/Gait Ambulation/Gait assistance: Min assist Ambulation Distance (Feet): 12 Feet Assistive device: Rolling walker (2 wheeled) Gait Pattern/deviations: Step-to pattern;Trunk flexed;Staggering left;Staggering right Gait velocity: decreased   General Gait Details: still very unsteady shaky gait with R lean and difficulty safely amb.  Near fall inbathroom during turining from commode to sink.  poor self corrective reaction and diffuculty maintaining midline posture.    Stairs            Wheelchair Mobility    Modified Rankin (Stroke Patients Only)       Balance                                    Cognition                            Exercises   Total Knee Replacement TE's 10 reps B LE ankle pumps 10 reps towel squeezes 10 reps knee presses 10 reps heel slides  10 reps SAQ's 10 reps SLR's 10 reps ABD Followed by ICE      General Comments        Pertinent Vitals/Pain Pain Assessment: 0-10 Pain Score: 3  Pain Location: L knee Pain Descriptors / Indicators: Sore Pain Intervention(s): Monitored during session;Repositioned;Ice applied    Home Living                      Prior Function            PT Goals (current goals can now be found in the care plan section) Progress towards PT goals: Progressing toward goals    Frequency  7X/week    PT Plan      Co-evaluation             End of Session Equipment Utilized During Treatment: Gait belt;Left knee immobilizer Activity Tolerance: Patient limited by fatigue Patient left: in chair;with call bell/phone within reach     Time: 1130-1205 PT Time Calculation (min) (ACUTE ONLY): 35 min  Charges:  $Gait Training: 8-22 mins $Therapeutic Exercise: 8-22 mins                    G Codes:      Sharnae Winfree  PTA [employer]  Acute  Rehab Pager      [phone]

## 2014-08-06 NOTE — Progress Notes (Signed)
Clinical Social Work Department CLINICAL SOCIAL WORK PLACEMENT NOTE 08/06/2014  Patient:  Ethan Robinson, Ethan Robinson  Account Number:  [id number] Admit date:  08/03/2014  Clinical Social Worker:  Cori Razor, LCSW  Date/time:  08/06/2014 03:10 PM  Clinical Social Work is seeking post-discharge placement for this patient at the following level of care:   SKILLED NURSING   (*CSW will update this form in Epic as items are completed)   08/05/2014  Patient/family provided with [facility] Department of Clinical Social Work's list of facilities offering this level of care within the geographic area requested by the patient (or if unable, by the patient's family).  08/05/2014  Patient/family informed of their freedom to choose among providers that offer the needed level of care, that participate in Medicare, Medicaid or managed care program needed by the patient, have an available bed and are willing to accept the patient.    Patient/family informed of [facility]' ownership interest in [facility], as well as of the fact that they are under no obligation to receive care at this facility.  PASARR submitted to EDS on 08/05/2014 PASARR number received on 08/05/2014  FL2 transmitted to all facilities in geographic area requested by pt/family on  08/05/2014 FL2 transmitted to all facilities within larger geographic area on   Patient informed that his/her managed care company has contracts with or will negotiate with  certain facilities, including the following:     Patient/family informed of bed offers received:  08/06/2014 Patient chooses bed at [facility] Physician recommends and patient chooses bed at    Patient to be transferred to [facility] on  08/06/2014 Patient to be transferred to facility by [facility] Patient and family notified of transfer on 08/06/2014 Name of family member notified:  Spouse  The following physician request were  entered in Epic:   Additional Comments: Pt / spouse are in agreement with d/c to SNF today. PT approved transport by car. NSG reviewed d/c summary, scripts, avs. Scripts included in d/c packet. D/C packet given to nsg to provide to pt / spouse prior to d/c.  Cori Razor LCSW [phone]

## 2014-08-06 NOTE — Progress Notes (Signed)
Subjective: 3 Days Post-Op Procedure(s) (LRB): LEFT TOTAL KNEE ARTHROPLASTY (Left) Patient reports pain as mild and moderate.   Patient seen in rounds with Dr. Lequita Halt.  Had a difficult day yesterday with therapy.  Became very unsteady on his feet and nearly fell.  Not stable or ready to go home yesterday.  Discharge held and social worker notified of possible need for placement.  Looking into [facility].  Will transfer to SNF today if bed available.  If he did very well with therapy, then possibly home but more than likely will need SNF. Patient is well, but has had some minor complaints of pain in the knee, requiring pain medications Patient is ready to go to SNF if bed available.  Objective: Vital signs in last 24 hours: Temp:  [98.7 F (37.1 C)-99.8 F (37.7 C)] 99.6 F (37.6 C) (03/10 0548) Pulse Rate:  [77-79] 77 (03/10 0548) Resp:  [16] 16 (03/10 0548) BP: (125-139)/(61-67) 125/61 mmHg (03/10 0548) SpO2:  [95 %-98 %] 95 % (03/10 0548)  Intake/Output from previous day:  Intake/Output Summary (Last 24 hours) at 08/06/14 0809 Last data filed at 08/06/14 0030  Gross per 24 hour  Intake    400 ml  Output   1300 ml  Net   -900 ml    Labs:  Recent Labs  08/04/14 0452 08/05/14 0430 08/06/14 0510  HGB 9.2* 9.2* 9.6*    Recent Labs  08/05/14 0430 08/06/14 0510  WBC 13.0* 10.7*  RBC 2.72* 2.86*  HCT 26.9* 28.2*  PLT 161 193    Recent Labs  08/04/14 0452 08/05/14 0430  NA 134* 129*  K 3.6 3.7  CL 105 99  CO2 23 23  BUN 13 11  CREATININE 0.80 0.76  GLUCOSE 138* 134*  CALCIUM 8.1* 8.1*   No results for input(s): LABPT, INR in the last 72 hours.  EXAM: General - Patient is Alert and Appropriate Extremity - Neurovascular intact Sensation intact distally Dorsiflexion/Plantar flexion intact Incision - clean, dry, no drainage, healing Motor Function - intact, moving foot and toes well on exam.   Assessment/Plan: 3 Days Post-Op Procedure(s) (LRB): LEFT  TOTAL KNEE ARTHROPLASTY (Left) Procedure(s) (LRB): LEFT TOTAL KNEE ARTHROPLASTY (Left) Past Medical History  Diagnosis Date  . Diverticulosis 2004    [facility]; due 2014  . Other and unspecified hyperlipidemia   . CAD (coronary artery disease)     Dr Gala Romney  . Unspecified essential hypertension   . Anemia 2010    H/H  12.9/38  . GERD (gastroesophageal reflux disease)     NO MEDS  . RA (rheumatoid arthritis)     Dr Dierdre Forth  AND OA BOTH KNEES AND HANDS  . Movement disorder     essential tremors  . Asthma     hx of   . Depression    Principal Problem:   OA (osteoarthritis) of knee  Estimated body mass index is 28.7 kg/(m^2) as calculated from the following:   Height as of this encounter: 5\' 10"  (1.778 m).   Weight as of this encounter: 90.719 kg (200 lb). Up with therapy Discharge to SNF Diet - Cardiac diet Follow up - in 1 weeks, next Thursday 08/13/2014 Activity - WBAT Disposition - Skilled nursing facility - [facility] Condition Upon Discharge - Stable D/C Meds - See DC Summary DVT Prophylaxis - Lovenox since he takes Primidone. Injections for one week and then switch back to a 325 mg Aspirin.  08/15/2014, PA-C Orthopaedic Surgery 08/06/2014, 8:09  AM    

## 2014-08-07 ENCOUNTER — Telehealth: Payer: Self-pay | Admitting: *Deleted

## 2014-08-07 NOTE — Telephone Encounter (Signed)
Pt was on TCM list was d/c from hosp 08/06/14 had (L) Knee arthroplasty done. Sent to SNF will f/u with Dr. Gerri Spore on 3/17...Raechel Chute

## 2014-08-08 ENCOUNTER — Encounter: Payer: Self-pay | Admitting: Internal Medicine

## 2014-08-08 ENCOUNTER — Non-Acute Institutional Stay (SKILLED_NURSING_FACILITY): Payer: Medicare Other | Admitting: Internal Medicine

## 2014-08-08 DIAGNOSIS — M069 Rheumatoid arthritis, unspecified: Secondary | ICD-10-CM | POA: Diagnosis not present

## 2014-08-08 DIAGNOSIS — Z96652 Presence of left artificial knee joint: Secondary | ICD-10-CM

## 2014-08-08 DIAGNOSIS — G250 Essential tremor: Secondary | ICD-10-CM

## 2014-08-08 DIAGNOSIS — T8140XA Infection following a procedure, unspecified, initial encounter: Secondary | ICD-10-CM

## 2014-08-08 DIAGNOSIS — E785 Hyperlipidemia, unspecified: Secondary | ICD-10-CM

## 2014-08-08 DIAGNOSIS — I2510 Atherosclerotic heart disease of native coronary artery without angina pectoris: Secondary | ICD-10-CM | POA: Diagnosis not present

## 2014-08-08 DIAGNOSIS — F32A Depression, unspecified: Secondary | ICD-10-CM

## 2014-08-08 DIAGNOSIS — T814XXA Infection following a procedure, initial encounter: Secondary | ICD-10-CM | POA: Diagnosis not present

## 2014-08-08 DIAGNOSIS — Z96659 Presence of unspecified artificial knee joint: Secondary | ICD-10-CM | POA: Insufficient documentation

## 2014-08-08 DIAGNOSIS — F329 Major depressive disorder, single episode, unspecified: Secondary | ICD-10-CM | POA: Diagnosis not present

## 2014-08-08 DIAGNOSIS — I119 Hypertensive heart disease without heart failure: Secondary | ICD-10-CM | POA: Diagnosis not present

## 2014-08-08 NOTE — Assessment & Plan Note (Signed)
On sulfasalazine and prednisone; Plan - continue current meds

## 2014-08-08 NOTE — Assessment & Plan Note (Signed)
Continue plavix °

## 2014-08-08 NOTE — Assessment & Plan Note (Signed)
Controlled with metoprolol

## 2014-08-08 NOTE — Progress Notes (Signed)
MRN: [id number] Name: Ethan Robinson  Sex: male Age: 80 y.o. DOB: 1934/11/12  [facility] Facility/Room:111 Level Of Care: SNF Provider: Merrilee Seashore D Emergency Contacts: Extended Emergency Contact Information Primary Emergency Contact: Wares,Brenda Address: [address]          [place], [place] [address] [place] Home Phone: [phone] Mobile Phone: [phone] Relation: Spouse    Allergies: Sulfonamide derivatives; Atorvastatin; Pravastatin sodium; and Simvastatin  Chief Complaint  Patient presents with  . New Admit To SNF    HPI: Patient is 80 y.o. male who is admitted to SNF for OT/PT after L knee arthroplasty.  Past Medical History  Diagnosis Date  . Diverticulosis 2004    [facility]; due 2014  . Other and unspecified hyperlipidemia   . CAD (coronary artery disease)     Dr Gala Romney  . Unspecified essential hypertension   . Anemia 2010    H/H  12.9/38  . GERD (gastroesophageal reflux disease)     NO MEDS  . RA (rheumatoid arthritis)     Dr Dierdre Forth  AND OA BOTH KNEES AND HANDS  . Movement disorder     essential tremors  . Asthma     hx of   . Depression     Past Surgical History  Procedure Laterality Date  . Tonsillectomy    . Angioplasty  1997    3 stents  . Knee surgery      x 2  . Carpal tunnel release      bilateral  . Trigger finger release      multiple  . Back surgery  1995    for ruptured disc LS spine  . Shoulder surgery  2005  . Colonoscopy  2004    Diverticulosis  . Cataract extraction, bilateral  2011  . Total knee arthroplasty  08/07/2011    Procedure: TOTAL KNEE ARTHROPLASTY;  Surgeon: Loanne Drilling, MD;  Location: [facility];  Service: Orthopedics;  Laterality: Right;  . Cholecystectomy N/A 02/13/2013    Procedure: LAPAROSCOPIC CHOLECYSTECTOMY WITH INTRAOPERATIVE CHOLANGIOGRAM;  Surgeon: Clovis Pu. Cornett, MD;  Location: [facility];  Service: General;  Laterality: N/A;  . Post op transfusion  9/14    for  anemia  . Transurethral resection of prostate N/A 04/10/2014    Procedure: TRANSURETHRAL RESECTION OF THE PROSTATE WITH GYRUS INSTRUMENTS;  Surgeon: Garnett Farm, MD;  Location: [facility];  Service: Urology;  Laterality: N/A;  . Cardiac catheterization      coronary stents x3- placed 20 yrs ago.  . Total knee arthroplasty Left 08/03/2014    Procedure: LEFT TOTAL KNEE ARTHROPLASTY;  Surgeon: Ollen Gross, MD;  Location: [facility];  Service: Orthopedics;  Laterality: Left;      Medication List       This list is accurate as of: 08/08/14 10:02 PM.  Always use your most recent med list.               acetaminophen 500 MG tablet  Commonly known as:  TYLENOL  Take 1,000 mg by mouth every 6 (six) hours as needed for moderate pain or headache.     bisacodyl 10 MG suppository  Commonly known as:  DULCOLAX  Place 1 suppository (10 mg total) rectally daily as needed for moderate constipation.     docusate sodium 100 MG capsule  Commonly known as:  COLACE  Take 1 capsule (100 mg total) by mouth 2 (two) times daily.     enoxaparin 40 MG/0.4ML  injection  Commonly known as:  LOVENOX  Inject 0.4 mLs (40 mg total) into the skin daily. Take daily for six days starting Friday 08/07/2014.  After six days, stop the injections and resume the home 325 mg Aspirin.     HYDROcodone-acetaminophen 5-325 MG per tablet  Commonly known as:  NORCO/VICODIN  Take 1-2 tablets by mouth every 4 (four) hours as needed for moderate pain.     methocarbamol 500 MG tablet  Commonly known as:  ROBAXIN  Take 1 tablet (500 mg total) by mouth every 6 (six) hours as needed for muscle spasms.     metoCLOPramide 5 MG tablet  Commonly known as:  REGLAN  Take 1 tablet (5 mg total) by mouth every 8 (eight) hours as needed for nausea (if ondansetron (ZOFRAN) ineffective.).     metoprolol succinate 25 MG 24 hr tablet  Commonly known as:  TOPROL-XL  TAKE ONE TABLET BY MOUTH ONCE DAILY     ondansetron 4 MG tablet  Commonly  known as:  ZOFRAN  Take 1 tablet (4 mg total) by mouth every 6 (six) hours as needed for nausea.     PARoxetine 20 MG tablet  Commonly known as:  PAXIL  TAKE ONE TABLET BY MOUTH ONCE DAILY     polyethylene glycol packet  Commonly known as:  MIRALAX / GLYCOLAX  Take 17 g by mouth daily as needed for mild constipation.     predniSONE 5 MG tablet  Commonly known as:  DELTASONE  Take 5 mg by mouth every morning.     primidone 50 MG tablet  Commonly known as:  MYSOLINE  Take 2 tablets (100 mg total) by mouth 2 (two) times daily.     rosuvastatin 20 MG tablet  Commonly known as:  CRESTOR  Take 1 tablet (20 mg total) by mouth at bedtime.     sulfaSALAzine 500 MG tablet  Commonly known as:  AZULFIDINE  Take 1,000 mg by mouth 2 (two) times daily.     SYSTANE 0.4-0.3 % Soln  Generic drug:  Polyethyl Glycol-Propyl Glycol  Apply 1 drop to eye 2 (two) times daily as needed (dry eyes).        No orders of the defined types were placed in this encounter.    Immunization History  Administered Date(s) Administered  . Influenza Whole 03/22/2005  . Influenza,inj,Quad PF,36+ Mos 02/24/2014  . Influenza-Unspecified 03/12/2013  . Pneumococcal Polysaccharide-23 05/29/2008  . Td 12/19/2007  . Zoster 06/11/2009    History  Substance Use Topics  . Smoking status: Former Smoker -- 2.00 packs/day    Types: Cigarettes    Quit date: 05/29/1972  . Smokeless tobacco: Never Used     Comment: smoked 1957-1974, up to 2 ppd  occ alcohol  . Alcohol Use: 0.6 oz/week    1 Cans of beer per week     Comment: beer rarely    Family history is noncontributory    Review of Systems  DATA OBTAINED: from patient;  No c/o GENERAL:  no fevers, fatigue, appetite changes SKIN: No itching, rash or wounds EYES: No eye pain, redness, discharge EARS: No earache, tinnitus, change in hearing NOSE: No congestion, drainage or bleeding  MOUTH/THROAT: No mouth or tooth pain, No sore throat RESPIRATORY: No  cough, wheezing, SOB CARDIAC: No chest pain, palpitations, lower extremity edema  GI: No abdominal pain, No N/V/D or constipation, No heartburn or reflux  GU: No dysuria, frequency or urgency, or incontinence  MUSCULOSKELETAL: No unrelieved bone/joint pain  NEUROLOGIC: No headache, dizziness or focal weakness PSYCHIATRIC: No overt anxiety or sadness, No behavior issue.   Filed Vitals:   08/08/14 1158  BP: 124/75  Pulse: 78  Temp: 98.1 F (36.7 C)  Resp: 18    Physical Exam  GENERAL APPEARANCE: Alert, conversant,  No acute distress.  SKIN: mils erythema and heat extentding from mid incision line HEAD: Normocephalic, atraumatic  EYES: Conjunctiva/lids clear. Pupils round, reactive. EOMs intact.  EARS: External exam WNL, canals clear. Hearing grossly normal.  NOSE: No deformity or discharge.  MOUTH/THROAT: Lips w/o lesions  RESPIRATORY: Breathing is even, unlabored. Lung sounds are clear   CARDIOVASCULAR: Heart RRR no murmurs, rubs or gallops. No peripheral edema.   GASTROINTESTINAL: Abdomen is soft, non-tender, not distended w/ normal bowel sounds. GENITOURINARY: Bladder non tender, not distended  MUSCULOSKELETAL: No abnormal joints or musculature NEUROLOGIC:  Cranial nerves 2-12 grossly intact. Moves all extremities  PSYCHIATRIC: Mood and affect appropriate to situation, no behavioral issues  Patient Active Problem List   Diagnosis Date Noted  . S/P total knee arthroplasty 08/08/2014  . Pre-operative cardiovascular examination 07/06/2014  . BPH (benign prostatic hypertrophy) with urinary obstruction 04/10/2014  . Gallstones 01/31/2013  . Back pain, thoracic 01/15/2013  . Right lumbar radiculopathy 01/15/2013  . OA (osteoarthritis) of knee 08/07/2011  . Unspecified adverse effect of unspecified drug, medicinal and biological substance 06/13/2011  . VENTRAL HERNIA 01/11/2010  . Rheumatoid arthritis 01/11/2010  . PAIN IN JOINT, MULTIPLE SITES 06/22/2009  . Hypertensive heart  disease without CHF 05/04/2009  . PEPTIC ULCER DISEASE 04/29/2009  . ARTHRALGIA 04/29/2009  . Depression 07/13/2008  . COUGH, CHRONIC 04/15/2008  . DYSPHAGIA PHARYNGEAL PHASE 04/15/2008  . Essential tremor 12/19/2007  . HYPERGLYCEMIA, FASTING 12/19/2007  . Hyperlipidemia 07/22/2007  . Anemia 07/22/2007  . Coronary atherosclerosis 07/22/2007  . ALLERGIC RHINITIS 07/22/2007  . ASTHMA 07/22/2007  . GERD 07/22/2007  . DEGENERATIVE JOINT DISEASE 07/22/2007  . BENIGN PROSTATIC HYPERTROPHY, HX OF 07/22/2007    CBC    Component Value Date/Time   WBC 10.7* 08/06/2014 0510   RBC 2.86* 08/06/2014 0510   HGB 9.6* 08/06/2014 0510   HCT 28.2* 08/06/2014 0510   PLT 193 08/06/2014 0510   MCV 98.6 08/06/2014 0510   LYMPHSABS 2.4 06/12/2013 1059   MONOABS 0.8 06/12/2013 1059   EOSABS 0.6 06/12/2013 1059   BASOSABS 0.0 06/12/2013 1059    CMP     Component Value Date/Time   NA 129* 08/05/2014 0430   K 3.7 08/05/2014 0430   CL 99 08/05/2014 0430   CO2 23 08/05/2014 0430   GLUCOSE 134* 08/05/2014 0430   BUN 11 08/05/2014 0430   CREATININE 0.76 08/05/2014 0430   CALCIUM 8.1* 08/05/2014 0430   PROT 7.6 07/27/2014 1525   ALBUMIN 4.6 07/27/2014 1525   AST 38* 07/27/2014 1525   ALT 24 07/27/2014 1525   ALKPHOS 36* 07/27/2014 1525   BILITOT 0.8 07/27/2014 1525   GFRNONAA 86* 08/05/2014 0430   GFRAA >90 08/05/2014 0430    Assessment and Plan  S/P total knee arthroplasty 2/2 OA; admitted to SNF for OT/PT; Lovenox prophylaxis for 7 days then ASA 325 for prophylaxis   Rheumatoid arthritis On sulfasalazine and prednisone; Plan - continue current meds   Essential tremor Controlled by mysoline daily   Coronary atherosclerosis S/p stents; continue metoprolol and ASA, statin   Hypertensive heart disease without CHF Controlled with metoprolol   Hyperlipidemia Continue crestor   Depression Continue plavix     Ivionna Verley,  Noah Delaine, MD

## 2014-08-08 NOTE — Assessment & Plan Note (Signed)
S/p stents; continue metoprolol and ASA, statin

## 2014-08-08 NOTE — Assessment & Plan Note (Signed)
Continue crestor 

## 2014-08-08 NOTE — Assessment & Plan Note (Signed)
Skin infection extending from incision;pt started on Keflex for 7 days

## 2014-08-08 NOTE — Assessment & Plan Note (Signed)
2/2 OA; admitted to SNF for OT/PT; Lovenox prophylaxis for 7 days then ASA 325 for prophylaxis

## 2014-08-08 NOTE — Assessment & Plan Note (Signed)
Controlled by mysoline daily

## 2014-08-10 ENCOUNTER — Encounter: Payer: Self-pay | Admitting: Internal Medicine

## 2014-08-11 ENCOUNTER — Encounter: Payer: Self-pay | Admitting: Internal Medicine

## 2014-08-11 ENCOUNTER — Non-Acute Institutional Stay (SKILLED_NURSING_FACILITY): Payer: Medicare Other | Admitting: Internal Medicine

## 2014-08-11 DIAGNOSIS — T814XXA Infection following a procedure, initial encounter: Secondary | ICD-10-CM

## 2014-08-11 DIAGNOSIS — T8140XA Infection following a procedure, unspecified, initial encounter: Secondary | ICD-10-CM

## 2014-08-11 DIAGNOSIS — D539 Nutritional anemia, unspecified: Secondary | ICD-10-CM | POA: Diagnosis not present

## 2014-08-11 MED ORDER — DOXYCYCLINE HYCLATE 100 MG PO TABS
100.0000 mg | ORAL_TABLET | Freq: Two times a day (BID) | ORAL | Status: DC
Start: 2014-08-11 — End: 2014-08-14

## 2014-08-11 NOTE — Assessment & Plan Note (Addendum)
Nurse reported looked worse starting today. Keflex was started 3/10. Will d/c kelflex and start doxycycline 100 mg BID today and get 2 doses in today and tx for total 7 days doxy. LLE edema , U/S done yesterday neg for DVT.

## 2014-08-11 NOTE — Progress Notes (Signed)
MRN: [id number] Name: Ethan Robinson  Sex: male Age: 80 y.o. DOB: March 14, 1935  [facility] Facility/Room:111 Care: SNF Provider: Merrilee Seashore D Emergency Contacts: Extended Emergency Contact Information Primary Emergency Contact: Rorrer,Brenda Address: [address]          [place], [place] [address] [place] Home Phone: [phone] Mobile Phone: [phone] Relation: Spouse  Code Status: FULL  Allergies: Sulfonamide derivatives; Atorvastatin; Pravastatin sodium; and Simvastatin  Chief Complaint  Patient presents with  . Acute Visit  . Medical Management of Chronic Issues    HPI: Patient is 80 y.o. male who nursing asked me to see because cellulitis around his incision is worse today suddenly. Also a lab came back today that needs some attention.  Past Medical History  Diagnosis Date  . Diverticulosis 2004    [facility]; due 2014  . Other and unspecified hyperlipidemia   . CAD (coronary artery disease)     Dr Gala Romney  . Unspecified essential hypertension   . Anemia 2010    H/H  12.9/38  . GERD (gastroesophageal reflux disease)     NO MEDS  . RA (rheumatoid arthritis)     Dr Dierdre Forth  AND OA BOTH KNEES AND HANDS  . Movement disorder     essential tremors  . Asthma     hx of   . Depression     Past Surgical History  Procedure Laterality Date  . Tonsillectomy    . Angioplasty  1997    3 stents  . Knee surgery      x 2  . Carpal tunnel release      bilateral  . Trigger finger release      multiple  . Back surgery  1995    for ruptured disc LS spine  . Shoulder surgery  2005  . Colonoscopy  2004    Diverticulosis  . Cataract extraction, bilateral  2011  . Total knee arthroplasty  08/07/2011    Procedure: TOTAL KNEE ARTHROPLASTY;  Surgeon: Loanne Drilling, MD;  Location: [facility];  Service: Orthopedics;  Laterality: Right;  . Cholecystectomy N/A 02/13/2013    Procedure: LAPAROSCOPIC CHOLECYSTECTOMY WITH INTRAOPERATIVE  CHOLANGIOGRAM;  Surgeon: Clovis Pu. Cornett, MD;  Location: [facility];  Service: General;  Laterality: N/A;  . Post op transfusion  9/14    for anemia  . Transurethral resection of prostate N/A 04/10/2014    Procedure: TRANSURETHRAL RESECTION OF THE PROSTATE WITH GYRUS INSTRUMENTS;  Surgeon: Garnett Farm, MD;  Location: [facility];  Service: Urology;  Laterality: N/A;  . Cardiac catheterization      coronary stents x3- placed 20 yrs ago.  . Total knee arthroplasty Left 08/03/2014    Procedure: LEFT TOTAL KNEE ARTHROPLASTY;  Surgeon: Ollen Gross, MD;  Location: [facility];  Service: Orthopedics;  Laterality: Left;      Medication List       This list is accurate as of: 08/11/14  9:29 PM.  Always use your most recent med list.               acetaminophen 500 MG tablet  Commonly known as:  TYLENOL  Take 1,000 mg by mouth every 6 (six) hours as needed for moderate pain or headache.     bisacodyl 10 MG suppository  Commonly known as:  DULCOLAX  Place 1 suppository (10 mg total) rectally daily as needed for moderate constipation.     docusate sodium 100 MG capsule  Commonly known as:  COLACE  Take 1 capsule (100 mg total) by mouth 2 (two) times daily.     doxycycline 100 MG tablet  Commonly known as:  VIBRA-TABS  Take 1 tablet (100 mg total) by mouth 2 (two) times daily.     enoxaparin 40 MG/0.4ML injection  Commonly known as:  LOVENOX  Inject 0.4 mLs (40 mg total) into the skin daily. Take daily for six days starting Friday 08/07/2014.  After six days, stop the injections and resume the home 325 mg Aspirin.     HYDROcodone-acetaminophen 5-325 MG per tablet  Commonly known as:  NORCO/VICODIN  Take 1-2 tablets by mouth every 4 (four) hours as needed for moderate pain.     methocarbamol 500 MG tablet  Commonly known as:  ROBAXIN  Take 1 tablet (500 mg total) by mouth every 6 (six) hours as needed for muscle spasms.     metoCLOPramide 5 MG tablet  Commonly known as:  REGLAN  Take 1  tablet (5 mg total) by mouth every 8 (eight) hours as needed for nausea (if ondansetron (ZOFRAN) ineffective.).     metoprolol succinate 25 MG 24 hr tablet  Commonly known as:  TOPROL-XL  TAKE ONE TABLET BY MOUTH ONCE DAILY     ondansetron 4 MG tablet  Commonly known as:  ZOFRAN  Take 1 tablet (4 mg total) by mouth every 6 (six) hours as needed for nausea.     PARoxetine 20 MG tablet  Commonly known as:  PAXIL  TAKE ONE TABLET BY MOUTH ONCE DAILY     polyethylene glycol packet  Commonly known as:  MIRALAX / GLYCOLAX  Take 17 g by mouth daily as needed for mild constipation.     predniSONE 5 MG tablet  Commonly known as:  DELTASONE  Take 5 mg by mouth every morning.     primidone 50 MG tablet  Commonly known as:  MYSOLINE  Take 2 tablets (100 mg total) by mouth 2 (two) times daily.     rosuvastatin 20 MG tablet  Commonly known as:  CRESTOR  Take 1 tablet (20 mg total) by mouth at bedtime.     sulfaSALAzine 500 MG tablet  Commonly known as:  AZULFIDINE  Take 1,000 mg by mouth 2 (two) times daily.     SYSTANE 0.4-0.3 % Soln  Generic drug:  Polyethyl Glycol-Propyl Glycol  Apply 1 drop to eye 2 (two) times daily as needed (dry eyes).        Meds ordered this encounter  Medications  . doxycycline (VIBRA-TABS) 100 MG tablet    Sig: Take 1 tablet (100 mg total) by mouth 2 (two) times daily.    Dispense:  14 tablet    Refill:  0    Immunization History  Administered Date(s) Administered  . Influenza Whole 03/22/2005  . Influenza,inj,Quad PF,36+ Mos 02/24/2014  . Influenza-Unspecified 03/12/2013  . Pneumococcal Polysaccharide-23 05/29/2008  . Td 12/19/2007  . Zoster 06/11/2009    History  Substance Use Topics  . Smoking status: Former Smoker -- 2.00 packs/day    Types: Cigarettes    Quit date: 05/29/1972  . Smokeless tobacco: Never Used     Comment: smoked 1957-1974, up to 2 ppd  occ alcohol  . Alcohol Use: 0.6 oz/week    1 Cans of beer per week     Comment:  beer rarely    Review of Systems  DATA OBTAINED: from patient, nurse, medical record, family member GENERAL:  no fevers, fatigue, appetite changes SKIN: soreness L knee  HEENT: No complaint RESPIRATORY: No cough, wheezing, SOB CARDIAC: No chest pain, palpitations, lower extremity edema  GI: No abdominal pain, No N/V/D or constipation, No heartburn or reflux  GU: No dysuria, frequency or urgency, or incontinence  MUSCULOSKELETAL: soreness with bending knee but has been participating in PT NEUROLOGIC: No headache, dizziness  PSYCHIATRIC: No overt anxiety or sadness  Filed Vitals:   08/11/14 1344  BP: 136/73  Pulse: 81  Temp: 98.3 F (36.8 C)  Resp: 20    Physical Exam  GENERAL APPEARANCE: Alert, conversant, No acute distress  SKIN: redness , heat and swelling to L knee, worse than prior HEENT: Unremarkable RESPIRATORY: Breathing is even, unlabored. Lung sounds are clear   CARDIOVASCULAR: Heart RRR no murmurs, rubs or gallops. 2+ peripheral edema LLE GASTROINTESTINAL: Abdomen is soft, non-tender, not distended w/ normal bowel sounds.  GENITOURINARY: Bladder non tender, not distended  MUSCULOSKELETAL:incision L knee NEUROLOGIC: Cranial nerves 2-12 grossly intact. Moves all extremities PSYCHIATRIC: Mood and affect appropriate to situation, no behavioral issues  Patient Active Problem List   Diagnosis Date Noted  . S/P total knee arthroplasty 08/08/2014  . Post-operative infection 08/08/2014  . Pre-operative cardiovascular examination 07/06/2014  . BPH (benign prostatic hypertrophy) with urinary obstruction 04/10/2014  . Gallstones 01/31/2013  . Back pain, thoracic 01/15/2013  . Right lumbar radiculopathy 01/15/2013  . OA (osteoarthritis) of knee 08/07/2011  . Unspecified adverse effect of unspecified drug, medicinal and biological substance 06/13/2011  . VENTRAL HERNIA 01/11/2010  . Rheumatoid arthritis 01/11/2010  . PAIN IN JOINT, MULTIPLE SITES 06/22/2009  .  Hypertensive heart disease without CHF 05/04/2009  . PEPTIC ULCER DISEASE 04/29/2009  . ARTHRALGIA 04/29/2009  . Depression 07/13/2008  . COUGH, CHRONIC 04/15/2008  . DYSPHAGIA PHARYNGEAL PHASE 04/15/2008  . Essential tremor 12/19/2007  . HYPERGLYCEMIA, FASTING 12/19/2007  . Hyperlipidemia 07/22/2007  . Anemia 07/22/2007  . Coronary atherosclerosis 07/22/2007  . ALLERGIC RHINITIS 07/22/2007  . ASTHMA 07/22/2007  . GERD 07/22/2007  . DEGENERATIVE JOINT DISEASE 07/22/2007  . BENIGN PROSTATIC HYPERTROPHY, HX OF 07/22/2007    CBC    Component Value Date/Time   WBC 10.7* 08/06/2014 0510   RBC 2.86* 08/06/2014 0510   HGB 9.6* 08/06/2014 0510   HCT 28.2* 08/06/2014 0510   PLT 193 08/06/2014 0510   MCV 98.6 08/06/2014 0510   LYMPHSABS 2.4 06/12/2013 1059   MONOABS 0.8 06/12/2013 1059   EOSABS 0.6 06/12/2013 1059   BASOSABS 0.0 06/12/2013 1059    CMP     Component Value Date/Time   NA 129* 08/05/2014 0430   K 3.7 08/05/2014 0430   CL 99 08/05/2014 0430   CO2 23 08/05/2014 0430   GLUCOSE 134* 08/05/2014 0430   BUN 11 08/05/2014 0430   CREATININE 0.76 08/05/2014 0430   CALCIUM 8.1* 08/05/2014 0430   PROT 7.6 07/27/2014 1525   ALBUMIN 4.6 07/27/2014 1525   AST 38* 07/27/2014 1525   ALT 24 07/27/2014 1525   ALKPHOS 36* 07/27/2014 1525   BILITOT 0.8 07/27/2014 1525   GFRNONAA 86* 08/05/2014 0430   GFRAA >90 08/05/2014 0430    Assessment and Plan  Post-operative infection Nurse reported looked worse starting today. Keflex was started 3/10. Will d/c kelflex and start doxycycline!)) mg BID today and get 2 doses in today and tx for total 7 days doxy.Marland Kitchen     Margit Hanks, MD

## 2014-08-11 NOTE — Assessment & Plan Note (Addendum)
3/14 Hb 9.0/HCT 26.6 which is stable from prior but MCV is 100; review of epic did not show any anemia studies.Ordered B12 and folate levels

## 2014-08-14 ENCOUNTER — Non-Acute Institutional Stay (SKILLED_NURSING_FACILITY): Payer: Medicare Other | Admitting: Internal Medicine

## 2014-08-14 ENCOUNTER — Encounter: Payer: Self-pay | Admitting: Internal Medicine

## 2014-08-14 DIAGNOSIS — E785 Hyperlipidemia, unspecified: Secondary | ICD-10-CM | POA: Diagnosis not present

## 2014-08-14 DIAGNOSIS — T8140XA Infection following a procedure, unspecified, initial encounter: Secondary | ICD-10-CM

## 2014-08-14 DIAGNOSIS — F329 Major depressive disorder, single episode, unspecified: Secondary | ICD-10-CM | POA: Diagnosis not present

## 2014-08-14 DIAGNOSIS — T814XXA Infection following a procedure, initial encounter: Secondary | ICD-10-CM

## 2014-08-14 DIAGNOSIS — D539 Nutritional anemia, unspecified: Secondary | ICD-10-CM | POA: Diagnosis not present

## 2014-08-14 DIAGNOSIS — I119 Hypertensive heart disease without heart failure: Secondary | ICD-10-CM | POA: Diagnosis not present

## 2014-08-14 DIAGNOSIS — F32A Depression, unspecified: Secondary | ICD-10-CM

## 2014-08-14 DIAGNOSIS — M069 Rheumatoid arthritis, unspecified: Secondary | ICD-10-CM | POA: Diagnosis not present

## 2014-08-14 DIAGNOSIS — Z96652 Presence of left artificial knee joint: Secondary | ICD-10-CM

## 2014-08-14 DIAGNOSIS — I2510 Atherosclerotic heart disease of native coronary artery without angina pectoris: Secondary | ICD-10-CM | POA: Diagnosis not present

## 2014-08-14 NOTE — Progress Notes (Signed)
MRN: [id number] Name: Ethan Robinson  Sex: male Age: 80 y.o. DOB: [birth year]  [facility] #: [facility] Facility/Room:111 Level Of Care: SNF Provider: Merrilee Seashore D Emergency Contacts: Extended Emergency Contact Information Primary Emergency Contact: Schissler,Brenda Address: [address]          [place], [place] [address] [place] Home Phone: [phone] Mobile Phone: [phone] Relation: Spouse  Code Status: FULL  Allergies: Sulfonamide derivatives; Atorvastatin; Pravastatin sodium; and Simvastatin  Chief Complaint  Patient presents with  . Discharge Note    HPI: Patient is 80 y.o. male who was admitted to SNF s/p L knee arthroplasty who just came back from orthopedist office who said he could go home.  Past Medical History  Diagnosis Date  . Diverticulosis 2004    [facility]; due 2014  . Other and unspecified hyperlipidemia   . CAD (coronary artery disease)     Dr Gala Romney  . Unspecified essential hypertension   . Anemia 2010    H/H  12.9/38  . GERD (gastroesophageal reflux disease)     NO MEDS  . RA (rheumatoid arthritis)     Dr Dierdre Forth  AND OA BOTH KNEES AND HANDS  . Movement disorder     essential tremors  . Asthma     hx of   . Depression     Past Surgical History  Procedure Laterality Date  . Tonsillectomy    . Angioplasty  1997    3 stents  . Knee surgery      x 2  . Carpal tunnel release      bilateral  . Trigger finger release      multiple  . Back surgery  1995    for ruptured disc LS spine  . Shoulder surgery  2005  . Colonoscopy  2004    Diverticulosis  . Cataract extraction, bilateral  2011  . Total knee arthroplasty  08/07/2011    Procedure: TOTAL KNEE ARTHROPLASTY;  Surgeon: Loanne Drilling, MD;  Location: [facility];  Service: Orthopedics;  Laterality: Right;  . Cholecystectomy N/A 02/13/2013    Procedure: LAPAROSCOPIC CHOLECYSTECTOMY WITH INTRAOPERATIVE CHOLANGIOGRAM;  Surgeon: Clovis Pu. Cornett, MD;  Location: [facility];   Service: General;  Laterality: N/A;  . Post op transfusion  9/14    for anemia  . Transurethral resection of prostate N/A 04/10/2014    Procedure: TRANSURETHRAL RESECTION OF THE PROSTATE WITH GYRUS INSTRUMENTS;  Surgeon: Garnett Farm, MD;  Location: [facility];  Service: Urology;  Laterality: N/A;  . Cardiac catheterization      coronary stents x3- placed 20 yrs ago.  . Total knee arthroplasty Left 08/03/2014    Procedure: LEFT TOTAL KNEE ARTHROPLASTY;  Surgeon: Ollen Gross, MD;  Location: [facility];  Service: Orthopedics;  Laterality: Left;      Medication List       This list is accurate as of: 08/14/14  2:10 PM.  Always use your most recent med list.               acetaminophen 500 MG tablet  Commonly known as:  TYLENOL  Take 1,000 mg by mouth every 6 (six) hours as needed for moderate pain or headache.     bisacodyl 10 MG suppository  Commonly known as:  DULCOLAX  Place 1 suppository (10 mg total) rectally daily as needed for moderate constipation.     docusate sodium 100 MG capsule  Commonly known as:  COLACE  Take 1 capsule (100 mg total) by  mouth 2 (two) times daily.     doxycycline 100 MG tablet  Commonly known as:  VIBRA-TABS  Take 1 tablet (100 mg total) by mouth 2 (two) times daily.     enoxaparin 40 MG/0.4ML injection  Commonly known as:  LOVENOX  Inject 0.4 mLs (40 mg total) into the skin daily. Take daily for six days starting Friday 08/07/2014.  After six days, stop the injections and resume the home 325 mg Aspirin.     HYDROcodone-acetaminophen 5-325 MG per tablet  Commonly known as:  NORCO/VICODIN  Take 1-2 tablets by mouth every 4 (four) hours as needed for moderate pain.     methocarbamol 500 MG tablet  Commonly known as:  ROBAXIN  Take 1 tablet (500 mg total) by mouth every 6 (six) hours as needed for muscle spasms.     metoCLOPramide 5 MG tablet  Commonly known as:  REGLAN  Take 1 tablet (5 mg total) by mouth every 8 (eight) hours as needed for nausea  (if ondansetron (ZOFRAN) ineffective.).     metoprolol succinate 25 MG 24 hr tablet  Commonly known as:  TOPROL-XL  TAKE ONE TABLET BY MOUTH ONCE DAILY     ondansetron 4 MG tablet  Commonly known as:  ZOFRAN  Take 1 tablet (4 mg total) by mouth every 6 (six) hours as needed for nausea.     PARoxetine 20 MG tablet  Commonly known as:  PAXIL  TAKE ONE TABLET BY MOUTH ONCE DAILY     polyethylene glycol packet  Commonly known as:  MIRALAX / GLYCOLAX  Take 17 g by mouth daily as needed for mild constipation.     predniSONE 5 MG tablet  Commonly known as:  DELTASONE  Take 5 mg by mouth every morning.     primidone 50 MG tablet  Commonly known as:  MYSOLINE  Take 2 tablets (100 mg total) by mouth 2 (two) times daily.     rosuvastatin 20 MG tablet  Commonly known as:  CRESTOR  Take 1 tablet (20 mg total) by mouth at bedtime.     sulfaSALAzine 500 MG tablet  Commonly known as:  AZULFIDINE  Take 1,000 mg by mouth 2 (two) times daily.     SYSTANE 0.4-0.3 % Soln  Generic drug:  Polyethyl Glycol-Propyl Glycol  Apply 1 drop to eye 2 (two) times daily as needed (dry eyes).        No orders of the defined types were placed in this encounter.    Immunization History  Administered Date(s) Administered  . Influenza Whole 03/22/2005  . Influenza,inj,Quad PF,36+ Mos 02/24/2014  . Influenza-Unspecified 03/12/2013  . Pneumococcal Polysaccharide-23 05/29/2008  . Td 12/19/2007  . Zoster 06/11/2009    History  Substance Use Topics  . Smoking status: Former Smoker -- 2.00 packs/day    Types: Cigarettes    Quit date: 05/29/1972  . Smokeless tobacco: Never Used     Comment: smoked 1957-1974, up to 2 ppd  occ alcohol  . Alcohol Use: 0.6 oz/week    1 Cans of beer per week     Comment: beer rarely    Filed Vitals:   08/14/14 1402  BP: 124/75  Pulse: 72  Temp: 98.1 F (36.7 C)  Resp: 20    Physical Exam  GENERAL APPEARANCE: Alert, conversant. No acute distress.  HEENT:  Unremarkable. RESPIRATORY: Breathing is even, unlabored. Lung sounds are clear   CARDIOVASCULAR: Heart RRR no murmurs, rubs or gallops. No peripheral edema.  GASTROINTESTINAL: Abdomen  is soft, non-tender, not distended w/ normal bowel sounds.  NEUROLOGIC: Cranial nerves 2-12 grossly intact. Moves all extremities  Patient Active Problem List   Diagnosis Date Noted  . S/P total knee arthroplasty 08/08/2014  . Post-operative infection 08/08/2014  . Pre-operative cardiovascular examination 07/06/2014  . BPH (benign prostatic hypertrophy) with urinary obstruction 04/10/2014  . Gallstones 01/31/2013  . Back pain, thoracic 01/15/2013  . Right lumbar radiculopathy 01/15/2013  . OA (osteoarthritis) of knee 08/07/2011  . Unspecified adverse effect of unspecified drug, medicinal and biological substance 06/13/2011  . VENTRAL HERNIA 01/11/2010  . Rheumatoid arthritis 01/11/2010  . PAIN IN JOINT, MULTIPLE SITES 06/22/2009  . Hypertensive heart disease without CHF 05/04/2009  . PEPTIC ULCER DISEASE 04/29/2009  . ARTHRALGIA 04/29/2009  . Depression 07/13/2008  . COUGH, CHRONIC 04/15/2008  . DYSPHAGIA PHARYNGEAL PHASE 04/15/2008  . Essential tremor 12/19/2007  . HYPERGLYCEMIA, FASTING 12/19/2007  . Hyperlipidemia 07/22/2007  . Macrocytic anemia 07/22/2007  . Coronary atherosclerosis 07/22/2007  . ALLERGIC RHINITIS 07/22/2007  . ASTHMA 07/22/2007  . GERD 07/22/2007  . DEGENERATIVE JOINT DISEASE 07/22/2007  . BENIGN PROSTATIC HYPERTROPHY, HX OF 07/22/2007    CBC    Component Value Date/Time   WBC 10.7* 08/06/2014 0510   RBC 2.86* 08/06/2014 0510   HGB 9.6* 08/06/2014 0510   HCT 28.2* 08/06/2014 0510   PLT 193 08/06/2014 0510   MCV 98.6 08/06/2014 0510   LYMPHSABS 2.4 06/12/2013 1059   MONOABS 0.8 06/12/2013 1059   EOSABS 0.6 06/12/2013 1059   BASOSABS 0.0 06/12/2013 1059    CMP     Component Value Date/Time   NA 129* 08/05/2014 0430   K 3.7 08/05/2014 0430   CL 99 08/05/2014  0430   CO2 23 08/05/2014 0430   GLUCOSE 134* 08/05/2014 0430   BUN 11 08/05/2014 0430   CREATININE 0.76 08/05/2014 0430   CALCIUM 8.1* 08/05/2014 0430   PROT 7.6 07/27/2014 1525   ALBUMIN 4.6 07/27/2014 1525   AST 38* 07/27/2014 1525   ALT 24 07/27/2014 1525   ALKPHOS 36* 07/27/2014 1525   BILITOT 0.8 07/27/2014 1525   GFRNONAA 86* 08/05/2014 0430   GFRAA >90 08/05/2014 0430    Assessment and Plan  Pt is stable to d/c to home with HH/OT/PT.  Margit Hanks, MD

## 2014-09-07 ENCOUNTER — Other Ambulatory Visit: Payer: Self-pay | Admitting: Internal Medicine

## 2014-09-08 DIAGNOSIS — M0589 Other rheumatoid arthritis with rheumatoid factor of multiple sites: Secondary | ICD-10-CM | POA: Diagnosis not present

## 2014-09-08 DIAGNOSIS — M47816 Spondylosis without myelopathy or radiculopathy, lumbar region: Secondary | ICD-10-CM | POA: Diagnosis not present

## 2014-09-08 DIAGNOSIS — M150 Primary generalized (osteo)arthritis: Secondary | ICD-10-CM | POA: Diagnosis not present

## 2014-09-08 DIAGNOSIS — M5416 Radiculopathy, lumbar region: Secondary | ICD-10-CM | POA: Diagnosis not present

## 2014-09-08 DIAGNOSIS — M5136 Other intervertebral disc degeneration, lumbar region: Secondary | ICD-10-CM | POA: Diagnosis not present

## 2014-09-08 DIAGNOSIS — M65351 Trigger finger, right little finger: Secondary | ICD-10-CM | POA: Diagnosis not present

## 2014-09-09 ENCOUNTER — Other Ambulatory Visit: Payer: Self-pay

## 2014-09-09 MED ORDER — PAROXETINE HCL 20 MG PO TABS: 20.0000 mg | ORAL_TABLET | Freq: Every day | ORAL | Status: DC

## 2014-09-09 NOTE — Telephone Encounter (Signed)
OK X 3 mos 

## 2014-09-15 DIAGNOSIS — Z96652 Presence of left artificial knee joint: Secondary | ICD-10-CM | POA: Diagnosis not present

## 2014-09-15 DIAGNOSIS — Z471 Aftercare following joint replacement surgery: Secondary | ICD-10-CM | POA: Diagnosis not present

## 2014-09-28 DIAGNOSIS — M961 Postlaminectomy syndrome, not elsewhere classified: Secondary | ICD-10-CM | POA: Diagnosis not present

## 2014-10-08 DIAGNOSIS — M5136 Other intervertebral disc degeneration, lumbar region: Secondary | ICD-10-CM | POA: Diagnosis not present

## 2014-10-15 DIAGNOSIS — M5136 Other intervertebral disc degeneration, lumbar region: Secondary | ICD-10-CM | POA: Diagnosis not present

## 2014-10-21 DIAGNOSIS — M5136 Other intervertebral disc degeneration, lumbar region: Secondary | ICD-10-CM | POA: Diagnosis not present

## 2014-11-04 ENCOUNTER — Other Ambulatory Visit: Payer: Self-pay | Admitting: Internal Medicine

## 2014-11-06 ENCOUNTER — Other Ambulatory Visit: Payer: Self-pay | Admitting: Internal Medicine

## 2014-12-09 DIAGNOSIS — M0589 Other rheumatoid arthritis with rheumatoid factor of multiple sites: Secondary | ICD-10-CM | POA: Diagnosis not present

## 2014-12-09 DIAGNOSIS — M5416 Radiculopathy, lumbar region: Secondary | ICD-10-CM | POA: Diagnosis not present

## 2014-12-09 DIAGNOSIS — M150 Primary generalized (osteo)arthritis: Secondary | ICD-10-CM | POA: Diagnosis not present

## 2014-12-09 DIAGNOSIS — M65351 Trigger finger, right little finger: Secondary | ICD-10-CM | POA: Diagnosis not present

## 2014-12-31 ENCOUNTER — Other Ambulatory Visit: Payer: Self-pay | Admitting: Internal Medicine

## 2015-01-19 DIAGNOSIS — Z01812 Encounter for preprocedural laboratory examination: Secondary | ICD-10-CM | POA: Diagnosis not present

## 2015-01-21 DIAGNOSIS — M961 Postlaminectomy syndrome, not elsewhere classified: Secondary | ICD-10-CM | POA: Diagnosis not present

## 2015-02-02 ENCOUNTER — Other Ambulatory Visit: Payer: Self-pay | Admitting: Internal Medicine

## 2015-02-02 ENCOUNTER — Encounter: Payer: Self-pay | Admitting: Internal Medicine

## 2015-02-02 ENCOUNTER — Ambulatory Visit (INDEPENDENT_AMBULATORY_CARE_PROVIDER_SITE_OTHER)
Admission: RE | Admit: 2015-02-02 | Discharge: 2015-02-02 | Disposition: A | Discharge status: Home / Self Care | Payer: Medicare Other | Source: Ambulatory Visit | Attending: Internal Medicine | Admitting: Internal Medicine

## 2015-02-02 ENCOUNTER — Ambulatory Visit (INDEPENDENT_AMBULATORY_CARE_PROVIDER_SITE_OTHER): Payer: Medicare Other | Admitting: Internal Medicine

## 2015-02-02 VITALS — BP 122/68 | HR 56 | Temp 98.1°F | Resp 16 | Wt 190.0 lb

## 2015-02-02 DIAGNOSIS — R7301 Impaired fasting glucose: Secondary | ICD-10-CM

## 2015-02-02 DIAGNOSIS — M069 Rheumatoid arthritis, unspecified: Secondary | ICD-10-CM

## 2015-02-02 DIAGNOSIS — R0781 Pleurodynia: Secondary | ICD-10-CM

## 2015-02-02 DIAGNOSIS — E785 Hyperlipidemia, unspecified: Secondary | ICD-10-CM | POA: Diagnosis not present

## 2015-02-02 DIAGNOSIS — I517 Cardiomegaly: Secondary | ICD-10-CM | POA: Diagnosis not present

## 2015-02-02 DIAGNOSIS — G250 Essential tremor: Secondary | ICD-10-CM | POA: Diagnosis not present

## 2015-02-02 NOTE — Progress Notes (Signed)
Pre visit review using our clinic review tool, if applicable. No additional management support is needed unless otherwise documented below in the visit note. 

## 2015-02-02 NOTE — Patient Instructions (Signed)
  Your next office appointment will be determined based upon review of your pending labs  and  xrays  Those written interpretation of the lab results and instructions will be transmitted to you by mail for your records.  Critical results will be called.   Followup as needed for any active or acute issue. Please report any significant change in your symptoms. 

## 2015-02-02 NOTE — Progress Notes (Signed)
   Subjective:    Patient ID: Ethan Robinson, male    DOB: Sep 14, 1934, 80 y.o.   MRN: [id number]  HPI The patient is here to assess status of active health conditions.  PMH, FH, & Social History reviewed & updated.No change in FH as recorded.  He eats some red meat; he is not restricting salt. He does not exercise.He has been compliant with his cholesterol medication.   He is describing right inferiolateral chest pain since last week without any injury or trigger. It is worse if he "sniffs" or coughs. He has chronic sputum production. He denies any fever, chills, sweats, or weight loss.He has not smoked since 1974.   His hand tremor is getting worse; it still asymmetric, worse on the left. It's gotten to the point where he spills coffee. He has seen Dr. Terrace Arabia, Neurologist annually.  Rheumatologist, Dr. Dierdre Forth, sees him annually. He will have not coming appointment this month or next.  Review of Systems He denies any hemoptysis. He is not having exertional chest pain, palpitations, claudication, or pedal edema. He denies any paroxysmal nocturnal dyspnea.   He has chronic tinnitus in the right ear which he describes as static.  He has tingling in his feet and the feet get chilled easily.  He has bilateral shoulder pain which interferes with sleeping.       Objective:   Physical Exam  Pertinent or positive findings include: bobbing tremor of the head. His voice is halting and breaks repeatedly. Tympanic membranes are scarred. Heart rate is slow. He has rales especially in the right lung up to the scapular level. He has point tenderness over the right mid rib cage in the axillary line. DIP changes of osteoarthritis are present. Homans sign is negative.  General appearance :adequately nourished; in no distress.  Eyes: No conjunctival inflammation or scleral icterus is present.  Oral exam:  Lips and gums are healthy appearing.There is no oropharyngeal erythema or exudate noted.  Dental hygiene is good.  Heart:  Normal rate and regular rhythm. S1 and S2 normal without gallop, murmur, click, rub or other extra sounds    Lungs:No wheezes, rhonchi ,or rubs present.No increased work of breathing.   Abdomen: bowel sounds normal, soft and non-tender without masses, organomegaly or hernias noted.  No guarding or rebound.   Vascular : all pulses equal ; no bruits present.  Skin:Warm & dry.  Intact without suspicious lesions or rashes ; no tenting or jaundice   Lymphatic: No lymphadenopathy is noted about the head, neck, axilla.   Neuro: Strength, tone & DTRs normal.     Assessment & Plan:  #1 pleuritic chest pain, right  #2 Dyslipidemia  #3 abnormal breath sounds; rule out rheumatoid lung  Plan: See orders and recommendations

## 2015-02-03 ENCOUNTER — Other Ambulatory Visit: Payer: Self-pay | Admitting: Internal Medicine

## 2015-02-03 ENCOUNTER — Other Ambulatory Visit: Payer: Self-pay | Admitting: Emergency Medicine

## 2015-02-03 ENCOUNTER — Other Ambulatory Visit (INDEPENDENT_AMBULATORY_CARE_PROVIDER_SITE_OTHER): Payer: Medicare Other

## 2015-02-03 DIAGNOSIS — R7301 Impaired fasting glucose: Secondary | ICD-10-CM

## 2015-02-03 DIAGNOSIS — E785 Hyperlipidemia, unspecified: Secondary | ICD-10-CM

## 2015-02-03 DIAGNOSIS — D539 Nutritional anemia, unspecified: Secondary | ICD-10-CM

## 2015-02-03 DIAGNOSIS — R0781 Pleurodynia: Secondary | ICD-10-CM | POA: Diagnosis not present

## 2015-02-03 DIAGNOSIS — R7989 Other specified abnormal findings of blood chemistry: Secondary | ICD-10-CM | POA: Insufficient documentation

## 2015-02-03 LAB — CBC WITH DIFFERENTIAL/PLATELET
BASOS ABS: 0.1 10*3/uL (ref 0.0–0.1)
Basophils Relative: 0.8 % (ref 0.0–3.0)
EOS ABS: 0.4 10*3/uL (ref 0.0–0.7)
Eosinophils Relative: 6.1 % — ABNORMAL HIGH (ref 0.0–5.0)
HCT: 33.7 % — ABNORMAL LOW (ref 39.0–52.0)
HEMOGLOBIN: 11.4 g/dL — AB (ref 13.0–17.0)
LYMPHS PCT: 27.5 % (ref 12.0–46.0)
Lymphs Abs: 1.9 10*3/uL (ref 0.7–4.0)
MCHC: 33.7 g/dL (ref 30.0–36.0)
MCV: 100 fl (ref 78.0–100.0)
Monocytes Absolute: 0.9 10*3/uL (ref 0.1–1.0)
Monocytes Relative: 13.3 % — ABNORMAL HIGH (ref 3.0–12.0)
Neutro Abs: 3.7 10*3/uL (ref 1.4–7.7)
Neutrophils Relative %: 52.3 % (ref 43.0–77.0)
PLATELETS: 268 10*3/uL (ref 150.0–400.0)
RBC: 3.37 Mil/uL — ABNORMAL LOW (ref 4.22–5.81)
RDW: 13.8 % (ref 11.5–15.5)
WBC: 7 10*3/uL (ref 4.0–10.5)

## 2015-02-03 LAB — BASIC METABOLIC PANEL
BUN: 15 mg/dL (ref 6–23)
CALCIUM: 9.4 mg/dL (ref 8.4–10.5)
CO2: 28 mEq/L (ref 19–32)
CREATININE: 0.88 mg/dL (ref 0.40–1.50)
Chloride: 101 mEq/L (ref 96–112)
GFR: 88.97 mL/min (ref >60.00)
Glucose, Bld: 100 mg/dL — ABNORMAL HIGH (ref 70–99)
Potassium: 4.5 mEq/L (ref 3.5–5.1)
Sodium: 135 mEq/L (ref 135–145)

## 2015-02-03 LAB — LIPID PANEL
CHOL/HDL RATIO: 3
Cholesterol: 141 mg/dL (ref 0–200)
HDL: 49 mg/dL (ref >39.00)
LDL CALC: 66 mg/dL (ref 0–99)
NONHDL: 92.43
TRIGLYCERIDES: 133 mg/dL (ref 0.0–149.0)
VLDL: 26.6 mg/dL (ref 0.0–40.0)

## 2015-02-03 LAB — HEPATIC FUNCTION PANEL
ALK PHOS: 37 U/L — AB (ref 39–117)
ALT: 16 U/L (ref 0–53)
AST: 26 U/L (ref 0–37)
Albumin: 4 g/dL (ref 3.5–5.2)
BILIRUBIN TOTAL: 0.4 mg/dL (ref 0.2–1.2)
Bilirubin, Direct: 0.1 mg/dL (ref 0.0–0.3)
Total Protein: 7.1 g/dL (ref 6.0–8.3)

## 2015-02-03 LAB — TSH: TSH: 12.97 u[IU]/mL — ABNORMAL HIGH (ref 0.35–4.50)

## 2015-02-03 LAB — HEMOGLOBIN A1C: HEMOGLOBIN A1C: 5.3 % (ref 4.6–6.5)

## 2015-02-03 MED ORDER — PAROXETINE HCL 20 MG PO TABS: 20.0000 mg | ORAL_TABLET | Freq: Every day | ORAL | Status: DC

## 2015-02-17 DIAGNOSIS — M961 Postlaminectomy syndrome, not elsewhere classified: Secondary | ICD-10-CM | POA: Diagnosis not present

## 2015-02-17 DIAGNOSIS — M5136 Other intervertebral disc degeneration, lumbar region: Secondary | ICD-10-CM | POA: Diagnosis not present

## 2015-02-22 ENCOUNTER — Telehealth: Payer: Self-pay | Admitting: Internal Medicine

## 2015-02-22 NOTE — Telephone Encounter (Signed)
Rec'd from [facility] forward 4 pages to Dr. Alwyn Ren

## 2015-03-04 ENCOUNTER — Other Ambulatory Visit: Payer: Self-pay | Admitting: Internal Medicine

## 2015-03-25 ENCOUNTER — Ambulatory Visit (INDEPENDENT_AMBULATORY_CARE_PROVIDER_SITE_OTHER): Payer: Medicare Other | Admitting: Neurology

## 2015-03-25 ENCOUNTER — Encounter: Payer: Self-pay | Admitting: Neurology

## 2015-03-25 ENCOUNTER — Ambulatory Visit: Payer: Medicare Other | Admitting: Neurology

## 2015-03-25 VITALS — BP 125/79 | HR 57 | Ht 70.0 in | Wt 193.0 lb

## 2015-03-25 DIAGNOSIS — G3184 Mild cognitive impairment, so stated: Secondary | ICD-10-CM

## 2015-03-25 DIAGNOSIS — G250 Essential tremor: Secondary | ICD-10-CM

## 2015-03-25 MED ORDER — PRIMIDONE 50 MG PO TABS
100.0000 mg | ORAL_TABLET | Freq: Three times a day (TID) | ORAL | Status: DC
Start: 2015-03-25 — End: 2016-03-27

## 2015-03-25 NOTE — Progress Notes (Signed)
Chief Complaint  Patient presents with  . Tremors    He is still having tremors in his head and hands.  Feels his symptoms are worse in his hands and he is having difficulty writing.  Also, he stammers when he talks now.     [facility]  PATIENT: Ethan Robinson DOB: 09-10-1934   REASON FOR VISIT: Follow-up for essential tremor  HISTORY OF PRESENT ILLNESS:Ethan Robinson, 80 year old white male returns for followup. He has history of bilateral hand tremor for greater than 12 years.  He has been a patient of our clinic Since 2010, bilateral hands, and head tremor, no family history of tremor, retired now but wants to continue medication because of difficulty writing, using keyboard,  knee replacement, mild gait difficulty. He is currently on primidone 100 mg twice daily with good benefit. He denies any side effects of the medication. He has past medical history of depression,anxiety, hyperlipidemia, coronary artery disease.  No gait difficulty, no limitation on his daily activity, performs all activities of daily living without difficulty. Has a history of ringing in his ears and hearing loss due to working in a noisy environment for years. He gets very little exercise. He returns for reevaluation  UPDATE Mar 25 2015: He still has bilateral hand tremor,  Head titubation, voice stammering, no gait diffiuclty, no Significant loss of smell, He wake up a lot in the middle of the night,  He has lumbar degenerative disc disease , also rheumatoid arthritis, complains of low back pain, multiple joints pain, could not exercise much..  He is taking primidone 50mg  ii bid, which seems to help him, no significant side effect,  REVIEW OF SYSTEMS: Full 14 system review of systems performed and notable only for those listed, all others are neg:   as above  ALLERGIES: Allergies  Allergen Reactions  . Sulfonamide Derivatives Rash    Medi Alert bracelet  is recommended  . Atorvastatin  Other (See Comments)    Cramping    . Pravastatin Sodium Other (See Comments)    Cramp   . Simvastatin Other (See Comments)    Cramps     HOME MEDICATIONS: Outpatient Prescriptions Prior to Visit  Medication Sig Dispense Refill  . acetaminophen (TYLENOL) 500 MG tablet Take 1,000 mg by mouth every 6 (six) hours as needed for moderate pain or headache.    Marland Kitchen aspirin 325 MG tablet Take 325 mg by mouth daily.    . bisacodyl (DULCOLAX) 10 MG suppository Place 1 suppository (10 mg total) rectally daily as needed for moderate constipation. 12 suppository 0  . docusate sodium (COLACE) 100 MG capsule Take 1 capsule (100 mg total) by mouth 2 (two) times daily. 10 capsule 0  . metoCLOPramide (REGLAN) 5 MG tablet Take 1 tablet (5 mg total) by mouth every 8 (eight) hours as needed for nausea (if ondansetron (ZOFRAN) ineffective.). 40 tablet 0  . metoprolol succinate (TOPROL-XL) 25 MG 24 hr tablet Take 1 tablet (25 mg total) by mouth daily. --patient needs office visit before any further refills 30 tablet 0  . PARoxetine (PAXIL) 20 MG tablet Take 1 tablet (20 mg total) by mouth daily. 30 tablet 2  . polyethylene glycol (MIRALAX / GLYCOLAX) packet Take 17 g by mouth daily as needed for mild constipation. 14 each 0  . predniSONE (DELTASONE) 5 MG tablet Take 5 mg by mouth every morning.     . primidone (MYSOLINE) 50 MG tablet Take 2 tablets (100 mg total) by mouth 2 (  two) times daily. 360 tablet 3  . rosuvastatin (CRESTOR) 20 MG tablet Take 1 tablet (20 mg total) by mouth at bedtime. 90 tablet 1  . sulfaSALAzine (AZULFIDINE) 500 MG tablet Take 1,000 mg by mouth 2 (two) times daily.     No facility-administered medications prior to visit.    PAST MEDICAL HISTORY: Past Medical History  Diagnosis Date  . Diverticulosis 2004    [facility]; due 2014  . Other and unspecified hyperlipidemia   . CAD (coronary artery disease)     Dr Gala Romney  . Unspecified essential hypertension   . Anemia 2010    H/H   12.9/38  . GERD (gastroesophageal reflux disease)     NO MEDS  . RA (rheumatoid arthritis) (HCC)     Dr Dierdre Forth  AND OA BOTH KNEES AND HANDS  . Movement disorder     essential tremors  . Asthma     hx of   . Depression     PAST SURGICAL HISTORY: Past Surgical History  Procedure Laterality Date  . Tonsillectomy    . Angioplasty  1997    3 stents  . Knee surgery      x 2  . Carpal tunnel release      bilateral  . Trigger finger release      multiple  . Back surgery  1995    for ruptured disc LS spine  . Shoulder surgery  2005  . Colonoscopy  2004    Diverticulosis  . Cataract extraction, bilateral  2011  . Total knee arthroplasty  08/07/2011    Procedure: TOTAL KNEE ARTHROPLASTY;  Surgeon: Loanne Drilling, MD;  Location: [facility];  Service: Orthopedics;  Laterality: Right;  . Cholecystectomy N/A 02/13/2013    Procedure: LAPAROSCOPIC CHOLECYSTECTOMY WITH INTRAOPERATIVE CHOLANGIOGRAM;  Surgeon: Clovis Pu. Cornett, MD;  Location: [facility];  Service: General;  Laterality: N/A;  . Post op transfusion  9/14    for anemia  . Transurethral resection of prostate N/A 04/10/2014    Procedure: TRANSURETHRAL RESECTION OF THE PROSTATE WITH GYRUS INSTRUMENTS;  Surgeon: Garnett Farm, MD;  Location: [facility];  Service: Urology;  Laterality: N/A;  . Cardiac catheterization      coronary stents x3- placed 20 yrs ago.  . Total knee arthroplasty Left 08/03/2014    Procedure: LEFT TOTAL KNEE ARTHROPLASTY;  Surgeon: Ollen Gross, MD;  Location: [facility];  Service: Orthopedics;  Laterality: Left;    FAMILY HISTORY: Family History  Problem Relation Age of Onset  . Cancer Father     esophagus  . Stroke Mother     in mid 62s  . Arthritis Mother   . Diabetes Brother   . Heart attack Paternal Grandmother     ? in 46s  . Diabetes Maternal Aunt   . Prostate cancer Maternal Uncle   . Cancer Maternal Uncle   . Prostate cancer Cousin   . Cancer Cousin   . Liver cancer      Paternal family history  .  Leukemia      Paternal family history  . Cancer Cousin     SOCIAL HISTORY: Social History   Social History  . Marital Status: Married    Spouse Name: Ethan Robinson  . Number of Children: 1  . Years of Education: 12   Occupational History  .      retired   Social History Main Topics  . Smoking status: Former Smoker -- 2.00 packs/day    Types: Cigarettes  Quit date: 05/29/1972  . Smokeless tobacco: Never Used     Comment: smoked 1957-1974, up to 2 ppd  occ alcohol  . Alcohol Use: 0.6 oz/week    1 Cans of beer per week     Comment: beer rarely  . Drug Use: No  . Sexual Activity: Not on file   Other Topics Concern  . Not on file   Social History Narrative   Patient lives with his wife(Brenda).   Patient has 1 child.   Patient has a high school education.   Patient is right-handed.   Patient is retired.     PHYSICAL EXAM  Filed Vitals:   03/25/15 1557  BP: 125/79  Pulse: 57  Height: 5\' 10"  (1.778 m)  Weight: 193 lb (87.544 kg)   Body mass index is 27.69 kg/(m^2).   PHYSICAL EXAMNIATION:  Gen: NAD, conversant, well nourised, obese, well groomed                     Cardiovascular: Regular rate rhythm, no peripheral edema, warm, nontender. Eyes: Conjunctivae clear without exudates or hemorrhage Neck: Supple, no carotid bruise. Pulmonary: Clear to auscultation bilaterally   NEUROLOGICAL EXAM:  MENTAL STATUS: Speech:    Speech is normal; fluent and spontaneous with normal comprehension.  Cognition:     Orientation to time, place and person     Normal recent and remote memory     Normal Attention span and concentration     Normal Language, naming, repeating,spontaneous speech     Fund of knowledge   CRANIAL NERVES: CN II: Visual fields are full to confrontation. Fundoscopic exam is normal with sharp discs and no vascular changes. Pupils are round equal and briskly reactive to light. CN III, IV, VI: extraocular movement are normal. No ptosis. CN V: Facial  sensation is intact to pinprick in all 3 divisions bilaterally. Corneal responses are intact.  CN VII: Face is symmetric with normal eye closure and smile. CN VIII: Hearing is normal to rubbing fingers CN IX, X: Palate elevates symmetrically. Phonation is normal. CN XI: Head turning and shoulder shrug are intact CN XII: Tongue is midline with normal movements and no atrophy.  MOTOR:  mild bilateral hands postural tremor, head titubation, no significant rigidity , bradykinesia. Muscle bulk and tone are normal. Muscle strength is normal.  REFLEXES: Reflexes are 2+ and symmetric at the biceps, triceps, knees, and ankles. Plantar responses are flexor.  SENSORY: Intact to light touch, pinprick, position sense, and vibration sense are intact in fingers and toes.  COORDINATION: Rapid alternating movements and fine finger movements are intact. There is no dysmetria on finger-to-nose and heel-knee-shin.    GAIT/STANCE: Posture is normal. Gait is steady with normal steps, base, arm swing, and turning. Heel and toe walking are normal. Tandem gait is normal.  Romberg is absent.    DIAGNOSTIC DATA (LABS, IMAGING, TESTING) - I reviewed patient records, labs, notes, testing and imaging myself where available.  Lab Results  Component Value Date   WBC 7.0 02/03/2015   HGB 11.4* 02/03/2015   HCT 33.7* 02/03/2015   MCV 100.0 02/03/2015   PLT 268.0 02/03/2015      Component Value Date/Time   NA 135 02/03/2015 0952   K 4.5 02/03/2015 0952   CL 101 02/03/2015 0952   CO2 28 02/03/2015 0952   GLUCOSE 100* 02/03/2015 0952   BUN 15 02/03/2015 0952   CREATININE 0.88 02/03/2015 0952   CALCIUM 9.4 02/03/2015 0952   PROT  7.1 02/03/2015 0952   ALBUMIN 4.0 02/03/2015 0952   AST 26 02/03/2015 0952   ALT 16 02/03/2015 0952   ALKPHOS 37* 02/03/2015 0952   BILITOT 0.4 02/03/2015 0952   GFRNONAA 86* 08/05/2014 0430   GFRAA >90 08/05/2014 0430    Lab Results  Component Value Date   HGBA1C 5.3  02/03/2015     ASSESSMENT AND PLAN  80 y.o. year old male     worsening essential tremor,    increase primidone to 50 mg 2 tablets 3 times a day  Mild cognitive impairment   I have encouraged him continue observe his symptoms, moderate exercise   Return to clinic in one year   Levert Feinstein, M.D. Ph.D.  [facility] [address] [place], [place] [address] Phone: [phone] Fax:      [phone]

## 2015-03-31 ENCOUNTER — Other Ambulatory Visit: Payer: Self-pay | Admitting: Internal Medicine

## 2015-04-07 DIAGNOSIS — M25519 Pain in unspecified shoulder: Secondary | ICD-10-CM | POA: Diagnosis not present

## 2015-04-07 DIAGNOSIS — M0579 Rheumatoid arthritis with rheumatoid factor of multiple sites without organ or systems involvement: Secondary | ICD-10-CM | POA: Diagnosis not present

## 2015-04-07 DIAGNOSIS — M150 Primary generalized (osteo)arthritis: Secondary | ICD-10-CM | POA: Diagnosis not present

## 2015-05-15 ENCOUNTER — Other Ambulatory Visit: Payer: Self-pay | Admitting: Internal Medicine

## 2015-05-20 ENCOUNTER — Telehealth: Payer: Self-pay | Admitting: Internal Medicine

## 2015-05-20 MED ORDER — METOPROLOL SUCCINATE ER 25 MG PO TB24
25.0000 mg | ORAL_TABLET | Freq: Every day | ORAL | Status: DC
Start: 2015-05-20 — End: 2015-10-30

## 2015-05-20 NOTE — Telephone Encounter (Signed)
[facility] requesting refill for metoprolol succinate (TOPROL-XL) 25 MG 24 hr tablet [[id number]]

## 2015-06-28 ENCOUNTER — Other Ambulatory Visit: Payer: Self-pay | Admitting: Internal Medicine

## 2015-06-29 ENCOUNTER — Other Ambulatory Visit: Payer: Self-pay | Admitting: Emergency Medicine

## 2015-06-29 MED ORDER — ROSUVASTATIN CALCIUM 20 MG PO TABS: 20.0000 mg | ORAL_TABLET | Freq: Every day | ORAL | Status: DC

## 2015-06-29 NOTE — Telephone Encounter (Signed)
Refill request for Crestor, Pt has multiple allergies to Statin drugs, okay to refill?

## 2015-07-08 DIAGNOSIS — M0579 Rheumatoid arthritis with rheumatoid factor of multiple sites without organ or systems involvement: Secondary | ICD-10-CM | POA: Diagnosis not present

## 2015-07-08 DIAGNOSIS — M791 Myalgia: Secondary | ICD-10-CM | POA: Diagnosis not present

## 2015-07-08 DIAGNOSIS — M150 Primary generalized (osteo)arthritis: Secondary | ICD-10-CM | POA: Diagnosis not present

## 2015-09-20 ENCOUNTER — Other Ambulatory Visit (INDEPENDENT_AMBULATORY_CARE_PROVIDER_SITE_OTHER): Payer: Medicare Other

## 2015-09-20 ENCOUNTER — Encounter: Payer: Self-pay | Admitting: Internal Medicine

## 2015-09-20 ENCOUNTER — Ambulatory Visit (INDEPENDENT_AMBULATORY_CARE_PROVIDER_SITE_OTHER): Payer: Medicare Other | Admitting: Internal Medicine

## 2015-09-20 VITALS — BP 144/72 | HR 66 | Temp 98.2°F | Resp 16 | Ht 70.0 in | Wt 192.0 lb

## 2015-09-20 DIAGNOSIS — E785 Hyperlipidemia, unspecified: Secondary | ICD-10-CM

## 2015-09-20 DIAGNOSIS — D539 Nutritional anemia, unspecified: Secondary | ICD-10-CM | POA: Diagnosis not present

## 2015-09-20 DIAGNOSIS — Z0000 Encounter for general adult medical examination without abnormal findings: Secondary | ICD-10-CM | POA: Diagnosis not present

## 2015-09-20 DIAGNOSIS — R946 Abnormal results of thyroid function studies: Secondary | ICD-10-CM | POA: Diagnosis not present

## 2015-09-20 DIAGNOSIS — M069 Rheumatoid arthritis, unspecified: Secondary | ICD-10-CM

## 2015-09-20 DIAGNOSIS — I2510 Atherosclerotic heart disease of native coronary artery without angina pectoris: Secondary | ICD-10-CM | POA: Diagnosis not present

## 2015-09-20 DIAGNOSIS — R7301 Impaired fasting glucose: Secondary | ICD-10-CM

## 2015-09-20 DIAGNOSIS — R7989 Other specified abnormal findings of blood chemistry: Secondary | ICD-10-CM

## 2015-09-20 DIAGNOSIS — N138 Other obstructive and reflux uropathy: Secondary | ICD-10-CM

## 2015-09-20 DIAGNOSIS — G3184 Mild cognitive impairment, so stated: Secondary | ICD-10-CM

## 2015-09-20 DIAGNOSIS — I119 Hypertensive heart disease without heart failure: Secondary | ICD-10-CM | POA: Diagnosis not present

## 2015-09-20 DIAGNOSIS — N401 Enlarged prostate with lower urinary tract symptoms: Secondary | ICD-10-CM

## 2015-09-20 DIAGNOSIS — G250 Essential tremor: Secondary | ICD-10-CM

## 2015-09-20 LAB — CBC WITH DIFFERENTIAL/PLATELET
BASOS ABS: 0 10*3/uL (ref 0.0–0.1)
Basophils Relative: 0.5 % (ref 0.0–3.0)
EOS ABS: 0.5 10*3/uL (ref 0.0–0.7)
Eosinophils Relative: 5.1 % — ABNORMAL HIGH (ref 0.0–5.0)
HCT: 36 % — ABNORMAL LOW (ref 39.0–52.0)
Hemoglobin: 12.2 g/dL — ABNORMAL LOW (ref 13.0–17.0)
LYMPHS ABS: 2.3 10*3/uL (ref 0.7–4.0)
Lymphocytes Relative: 24.8 % (ref 12.0–46.0)
MCHC: 33.9 g/dL (ref 30.0–36.0)
MCV: 98 fl (ref 78.0–100.0)
MONO ABS: 1.4 10*3/uL — AB (ref 0.1–1.0)
MONOS PCT: 14.9 % — AB (ref 3.0–12.0)
NEUTROS ABS: 5 10*3/uL (ref 1.4–7.7)
NEUTROS PCT: 54.7 % (ref 43.0–77.0)
PLATELETS: 283 10*3/uL (ref 150.0–400.0)
RBC: 3.67 Mil/uL — AB (ref 4.22–5.81)
RDW: 14.1 % (ref 11.5–15.5)
WBC: 9.1 10*3/uL (ref 4.0–10.5)

## 2015-09-20 LAB — COMPREHENSIVE METABOLIC PANEL
ALK PHOS: 39 U/L (ref 39–117)
ALT: 14 U/L (ref 0–53)
AST: 22 U/L (ref 0–37)
Albumin: 4.5 g/dL (ref 3.5–5.2)
BILIRUBIN TOTAL: 0.4 mg/dL (ref 0.2–1.2)
BUN: 21 mg/dL (ref 6–23)
CO2: 26 meq/L (ref 19–32)
Calcium: 10 mg/dL (ref 8.4–10.5)
Chloride: 102 mEq/L (ref 96–112)
Creatinine, Ser: 0.98 mg/dL (ref 0.40–1.50)
GFR: 78.45 mL/min (ref >60.00)
GLUCOSE: 99 mg/dL (ref 70–99)
Potassium: 4.5 mEq/L (ref 3.5–5.1)
SODIUM: 136 meq/L (ref 135–145)
TOTAL PROTEIN: 8.1 g/dL (ref 6.0–8.3)

## 2015-09-20 LAB — VITAMIN B12: VITAMIN B 12: 501 pg/mL (ref 211–911)

## 2015-09-20 LAB — LIPID PANEL
CHOL/HDL RATIO: 3
Cholesterol: 158 mg/dL (ref 0–200)
HDL: 51.5 mg/dL (ref >39.00)
LDL CALC: 85 mg/dL (ref 0–99)
NONHDL: 106.95
TRIGLYCERIDES: 109 mg/dL (ref 0.0–149.0)
VLDL: 21.8 mg/dL (ref 0.0–40.0)

## 2015-09-20 LAB — HEMOGLOBIN A1C: Hgb A1c MFr Bld: 5.6 % (ref 4.6–6.5)

## 2015-09-20 LAB — TSH: TSH: 11.95 u[IU]/mL — ABNORMAL HIGH (ref 0.35–4.50)

## 2015-09-20 NOTE — Progress Notes (Signed)
Pre visit review using our clinic review tool, if applicable. No additional management support is needed unless otherwise documented below in the visit note. 

## 2015-09-20 NOTE — Assessment & Plan Note (Signed)
Recheck tsh

## 2015-09-20 NOTE — Assessment & Plan Note (Signed)
Voice, hands, head Following with neuro On mysoline Tremor has gotten worse  - has difficulty writing

## 2015-09-20 NOTE — Assessment & Plan Note (Signed)
Check lipid panel Taking on 10mg  crestor

## 2015-09-20 NOTE — Assessment & Plan Note (Signed)
Following with Dr. Beekman 

## 2015-09-20 NOTE — Assessment & Plan Note (Signed)
Check a1c Increase exercise

## 2015-09-20 NOTE — Patient Instructions (Addendum)
  Mr. Ethan Robinson , Thank you for taking time to come for your Medicare Wellness Visit. I appreciate your ongoing commitment to your health goals. Please review the following plan we discussed and let me know if I can assist you in the future.   These are the goals we discussed: Goals    Ideally increase your exercise, balance exercises      This is a list of the screening recommended for you and due dates:  Health Maintenance  Topic Date Due  . Flu Shot  12/28/2015  . Tetanus Vaccine  12/18/2017  . Shingles Vaccine  Completed  . Pneumonia vaccines  Completed     Test(s) ordered today. Your results will be released to MyChart (or called to you) after review, usually within 72hours after test completion. If any changes need to be made, you will be notified at that same time.  All other Health Maintenance issues reviewed.   All recommended immunizations and age-appropriate screenings are up-to-date or discussed.  No immunizations administered today.   Medications reviewed and updated.  No changes recommended at this time.   Please followup in one year

## 2015-09-20 NOTE — Assessment & Plan Note (Signed)
S/p 3 stents No chest pain, palpitations, sob continue asa, statin, metoprolol

## 2015-09-20 NOTE — Assessment & Plan Note (Signed)
Check cbc, b12

## 2015-09-20 NOTE — Assessment & Plan Note (Signed)
BP well controlled at home and ok here Current regimen effective and well tolerated Continue current medications at current doses

## 2015-09-20 NOTE — Assessment & Plan Note (Signed)
Per neuro States difficulty with recall Will monitor

## 2015-09-20 NOTE — Assessment & Plan Note (Signed)
S/p TURP 

## 2015-09-20 NOTE — Progress Notes (Signed)
Subjective:    Patient ID: Ethan Robinson, male    DOB: [birth year], 81 y.o.   MRN: [id number]  HPI He is here to establish with a new pcp.     Here for medicare wellness exam.   I have personally reviewed and have noted 1.The patient's medical and social history 2.Their use of alcohol, tobacco or illicit drugs 3.Their current medications and supplements 4.The patient's functional ability including ADL's, fall risks, home safety              risks and hearing or visual impairment. 5.Diet and physical activities 6.Evidence for depression or mood disorders 7.Care team reviewed and updated - Dr Terrace Arabia from neurology, Dr             Dierdre Forth from rheumatology.     Are there smokers in your home (other than you)? Yes, his wife  Risk Factors Exercise: none due to joint pain Dietary issues discussed: does not eat as healthy as he should - eats out more than he should  Cardiac risk factors: advanced age, hypertension, hyperlipidemia  Depression Screen  Have you felt down, depressed or hopeless? Yes - very mild related to chronic pain, no improvement with cymbalta  Have you felt little interest or pleasure in doing things?  No  Activities of Daily Living In your present state of health, do you have any difficulty performing the following activities?:  Driving? No Managing money?  No Feeding yourself? No Getting from bed to chair? No Climbing a flight of stairs? No Preparing food and eating?: No Bathing or showering? No Getting dressed: yes - buttoning shirts Getting to/using the toilet? No Moving around from place to place: No In the past year have you fallen or had a near fall?: yes, poor balance, does not feel balance exercises will help.  Does not want to use a cane   Are you sexually active?  No  Do you have more than one partner?  N/A  Hearing Difficulties: yes Do you often ask people to speak up or repeat  themselves? yes Do you experience ringing or noises in your ears? Yes, intermittent, mild Do you have difficulty understanding soft or whispered voices? yes Vision:              Any change in vision: yes             Up to date with eye exam: no Memory:  Do you feel that you have a problem with memory? Yes, Recall with              names  Do you often misplace items? No  Do you feel safe at home?  Yes  Cognitive Testing  Alert, Orientated? Yes  Normal Appearance? Yes  Recall of three objects?  Yes  Can perform simple calculations? Yes  Displays appropriate judgment? Yes  Can read the correct time from a watch face? Yes   Advanced Directives have been discussed with the patient? Yes, in place  Medications and allergies reviewed with patient and updated if appropriate.  Patient Active Problem List   Diagnosis Date Noted  . Mild cognitive impairment 09/20/2015  . Elevated TSH 02/03/2015  . Post-operative infection 08/08/2014  . Pre-operative cardiovascular examination 07/06/2014  . BPH (benign prostatic hypertrophy) with urinary obstruction 04/10/2014  . Gallstones 01/31/2013  . Back pain, thoracic 01/15/2013  . Right lumbar radiculopathy 01/15/2013  . OA (osteoarthritis) of knee 08/07/2011  . Unspecified adverse effect of unspecified drug, medicinal  and biological substance 06/13/2011  . VENTRAL HERNIA 01/11/2010  . Rheumatoid arthritis (HCC) 01/11/2010  . PAIN IN JOINT, MULTIPLE SITES 06/22/2009  . Hypertensive heart disease without CHF 05/04/2009  . PEPTIC ULCER DISEASE 04/29/2009  . ARTHRALGIA 04/29/2009  . Depression 07/13/2008  . COUGH, CHRONIC 04/15/2008  . DYSPHAGIA PHARYNGEAL PHASE 04/15/2008  . Essential tremor 12/19/2007  . Fasting hyperglycemia 12/19/2007  . Hyperlipidemia 07/22/2007  . Macrocytic anemia 07/22/2007  . Coronary atherosclerosis 07/22/2007  . ALLERGIC RHINITIS 07/22/2007  . ASTHMA 07/22/2007  . GERD 07/22/2007  . DEGENERATIVE JOINT DISEASE  07/22/2007  . BENIGN PROSTATIC HYPERTROPHY, HX OF 07/22/2007    Current Outpatient Prescriptions on File Prior to Visit  Medication Sig Dispense Refill  . aspirin 325 MG tablet Take 325 mg by mouth daily.    . hydroxychloroquine (PLAQUENIL) 200 MG tablet     . methotrexate 50 MG/2ML injection     . metoprolol succinate (TOPROL-XL) 25 MG 24 hr tablet Take 1 tablet (25 mg total) by mouth daily. 90 tablet 1  . polyethylene glycol (MIRALAX / GLYCOLAX) packet Take 17 g by mouth daily as needed for mild constipation. 14 each 0  . predniSONE (DELTASONE) 5 MG tablet Take 5 mg by mouth every morning.     . primidone (MYSOLINE) 50 MG tablet Take 2 tablets (100 mg total) by mouth 3 (three) times daily. 540 tablet 3  . rosuvastatin (CRESTOR) 20 MG tablet Take 1 tablet (20 mg total) by mouth at bedtime. 90 tablet 1  . sulfaSALAzine (AZULFIDINE) 500 MG tablet Take 1,000 mg by mouth 2 (two) times daily.    Marland Kitchen acetaminophen (TYLENOL) 500 MG tablet Take 1,000 mg by mouth every 6 (six) hours as needed for moderate pain or headache. Reported on 09/20/2015    . [DISCONTINUED] Calcium Carbonate (CALCIUM 600 PO) Take 600 mg by mouth daily.      No current facility-administered medications on file prior to visit.    Past Medical History  Diagnosis Date  . Diverticulosis 2004    [facility]; due 2014  . Other and unspecified hyperlipidemia   . CAD (coronary artery disease)     Dr Gala Romney  . Unspecified essential hypertension   . Anemia 2010    H/H  12.9/38  . GERD (gastroesophageal reflux disease)     NO MEDS  . RA (rheumatoid arthritis) (HCC)     Dr Dierdre Forth  AND OA BOTH KNEES AND HANDS  . Movement disorder     essential tremors  . Asthma     hx of   . Depression     Past Surgical History  Procedure Laterality Date  . Tonsillectomy    . Angioplasty  1997    3 stents  . Knee surgery      x 2  . Carpal tunnel release      bilateral  . Trigger finger release      multiple  . Back surgery   1995    for ruptured disc LS spine  . Shoulder surgery  2005  . Colonoscopy  2004    Diverticulosis  . Cataract extraction, bilateral  2011  . Total knee arthroplasty  08/07/2011    Procedure: TOTAL KNEE ARTHROPLASTY;  Surgeon: Loanne Drilling, MD;  Location: [facility];  Service: Orthopedics;  Laterality: Right;  . Cholecystectomy N/A 02/13/2013    Procedure: LAPAROSCOPIC CHOLECYSTECTOMY WITH INTRAOPERATIVE CHOLANGIOGRAM;  Surgeon: Clovis Pu. Cornett, MD;  Location: [facility];  Service: General;  Laterality: N/A;  .  Post op transfusion  9/14    for anemia  . Transurethral resection of prostate N/A 04/10/2014    Procedure: TRANSURETHRAL RESECTION OF THE PROSTATE WITH GYRUS INSTRUMENTS;  Surgeon: Garnett Farm, MD;  Location: [facility];  Service: Urology;  Laterality: N/A;  . Cardiac catheterization      coronary stents x3- placed 20 yrs ago.  . Total knee arthroplasty Left 08/03/2014    Procedure: LEFT TOTAL KNEE ARTHROPLASTY;  Surgeon: Ollen Gross, MD;  Location: [facility];  Service: Orthopedics;  Laterality: Left;  . Prostate surgery      BPH    Social History   Social History  . Marital Status: Married    Spouse Name: Steward Drone  . Number of Children: 1  . Years of Education: 12   Occupational History  .      retired   Social History Main Topics  . Smoking status: Former Smoker -- 2.00 packs/day    Types: Cigarettes    Quit date: 05/29/1972  . Smokeless tobacco: Never Used     Comment: smoked 1957-1974, up to 2 ppd  occ alcohol  . Alcohol Use: 0.6 oz/week    1 Cans of beer per week     Comment: beer rarely  . Drug Use: No  . Sexual Activity: Not Asked   Other Topics Concern  . None   Social History Narrative   Patient lives with his wife Steward Drone).   Patient has 1 child.   Patient has a high school education.   Patient is right-handed.   Patient is retired.      Exercise: no    Family History  Problem Relation Age of Onset  . Cancer Father     esophagus  . Stroke Mother       in mid 65s  . Arthritis Mother   . Diabetes Brother   . Heart attack Paternal Grandmother     ? in 11s  . Diabetes Maternal Aunt   . Prostate cancer Maternal Uncle   . Cancer Maternal Uncle   . Prostate cancer Cousin   . Cancer Cousin   . Liver cancer      Paternal family history  . Leukemia      Paternal family history  . Cancer Cousin     Review of Systems  Constitutional: Negative for fever, chills, appetite change, fatigue and unexpected weight change.  HENT: Positive for hearing loss and tinnitus (mild).   Eyes: Positive for visual disturbance (difficulty reading up close ).  Respiratory: Positive for cough. Negative for shortness of breath and wheezing.   Cardiovascular: Negative for chest pain, palpitations and leg swelling.  Gastrointestinal: Positive for constipation. Negative for nausea, abdominal pain, diarrhea and blood in stool.       Rare gerd  Genitourinary: Negative for dysuria, hematuria and difficulty urinating.  Musculoskeletal: Positive for back pain and arthralgias.  Skin: Negative for color change and rash.  Neurological: Positive for dizziness. Negative for light-headedness, numbness (tingling in feet) and headaches.  Psychiatric/Behavioral: Positive for dysphoric mood (mild due to chronic pain, no relief with cymbalta). The patient is not nervous/anxious.        Objective:   Filed Vitals:   09/20/15 1413  BP: 144/72  Pulse: 66  Temp: 98.2 F (36.8 C)  Resp: 16   Filed Weights   09/20/15 1413  Weight: 192 lb (87.091 kg)   Body mass index is 27.55 kg/(m^2).   Physical Exam Constitutional: He appears well-developed and well-nourished. No  distress.  HENT:  Head: Normocephalic and atraumatic.  Right Ear: External ear normal.  Left Ear: External ear normal.  Mouth/Throat: Oropharynx is clear and moist.  Normal ear canals and TM b/l  Neck: Neck supple. No tracheal deviation present. No thyromegaly present.  No carotid bruit   Cardiovascular: Normal rate, regular rhythm, normal heart sounds and intact distal pulses.   1/6 systolic murmur. Pulmonary/Chest: Effort normal and breath sounds normal. No respiratory distress. He has no wheezes. He has no rales.  Abdominal: Soft. Bowel sounds are normal. He exhibits no distension. There is no tenderness.  Musculoskeletal: He exhibits no edema.  Lymphadenopathy:   He has no cervical adenopathy.  Skin: Skin is warm and dry. He is not diaphoretic.  Psychiatric: He has a normal mood and affect. His behavior is normal.         Assessment & Plan:   Wellness Exam: Immunizations Up to date Colonoscopy - last Robinson 2004, not needed at this age Eye exam - due, will schedule Hearing loss - yes, deferred audiology evaluation at this time Memory concerns/difficulties - mild cognitive impairment per neuro, he states some difficulty with recall only Independent of ADLs -  fully independent Exercise: none - ideally increase exercise  See Problem List for Assessment and Plan of chronic medical problems.

## 2015-09-23 ENCOUNTER — Other Ambulatory Visit: Payer: Self-pay | Admitting: Internal Medicine

## 2015-09-23 DIAGNOSIS — E038 Other specified hypothyroidism: Secondary | ICD-10-CM

## 2015-09-23 DIAGNOSIS — E039 Hypothyroidism, unspecified: Secondary | ICD-10-CM | POA: Insufficient documentation

## 2015-09-23 MED ORDER — LEVOTHYROXINE SODIUM 25 MCG PO TABS: 25.0000 ug | ORAL_TABLET | Freq: Every day | ORAL | Status: DC

## 2015-10-05 DIAGNOSIS — M150 Primary generalized (osteo)arthritis: Secondary | ICD-10-CM | POA: Diagnosis not present

## 2015-10-05 DIAGNOSIS — M0579 Rheumatoid arthritis with rheumatoid factor of multiple sites without organ or systems involvement: Secondary | ICD-10-CM | POA: Diagnosis not present

## 2015-10-05 DIAGNOSIS — M791 Myalgia: Secondary | ICD-10-CM | POA: Diagnosis not present

## 2015-10-14 IMAGING — CR DG CHEST 2V
2 series · 2 of 2 positions shown · non-contrast
Comparison: 02/11/2013

CLINICAL DATA: 79-year-old preop evaluation for prostate surgery

EXAM:
CHEST  2 VIEW

[w chest pa]
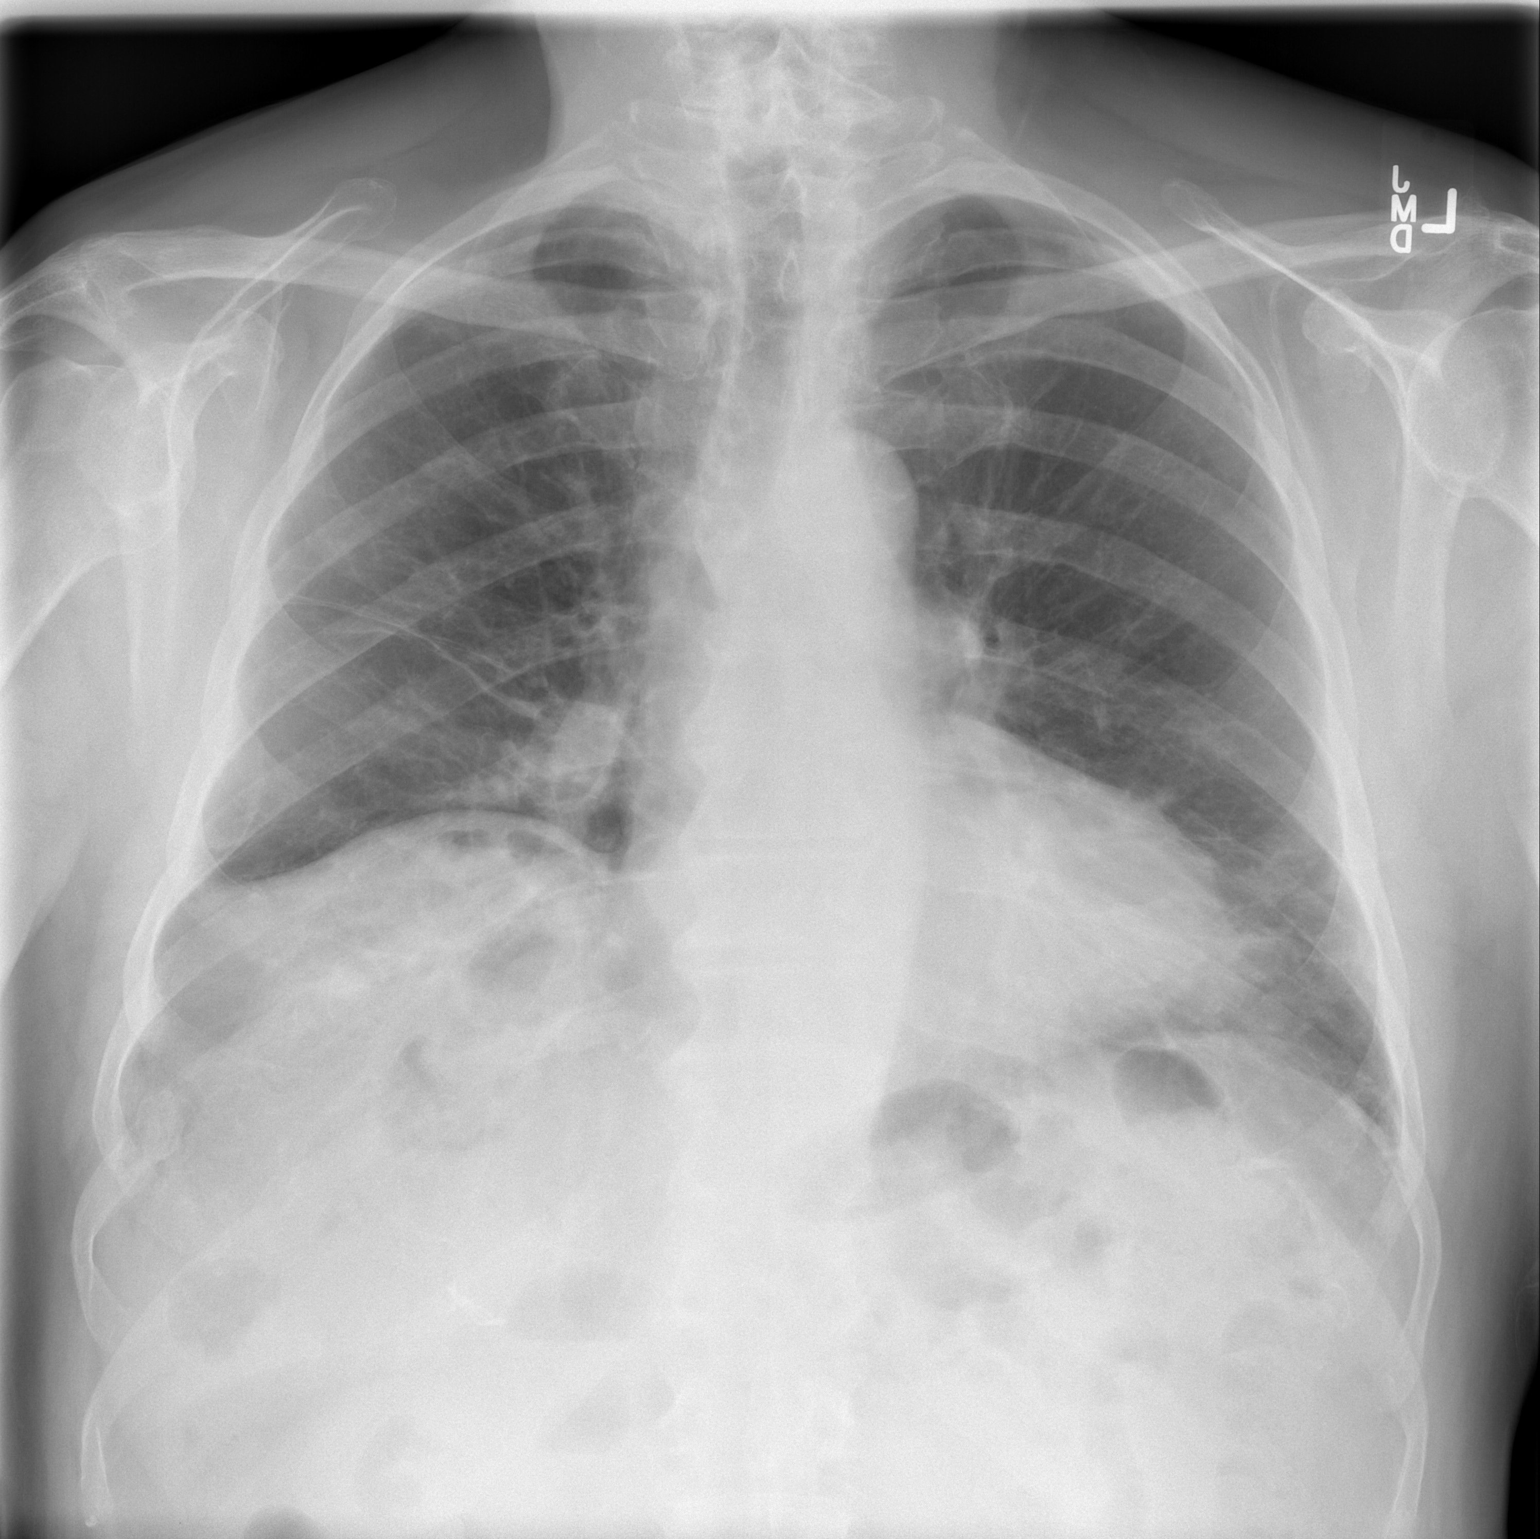

[w chest lat]
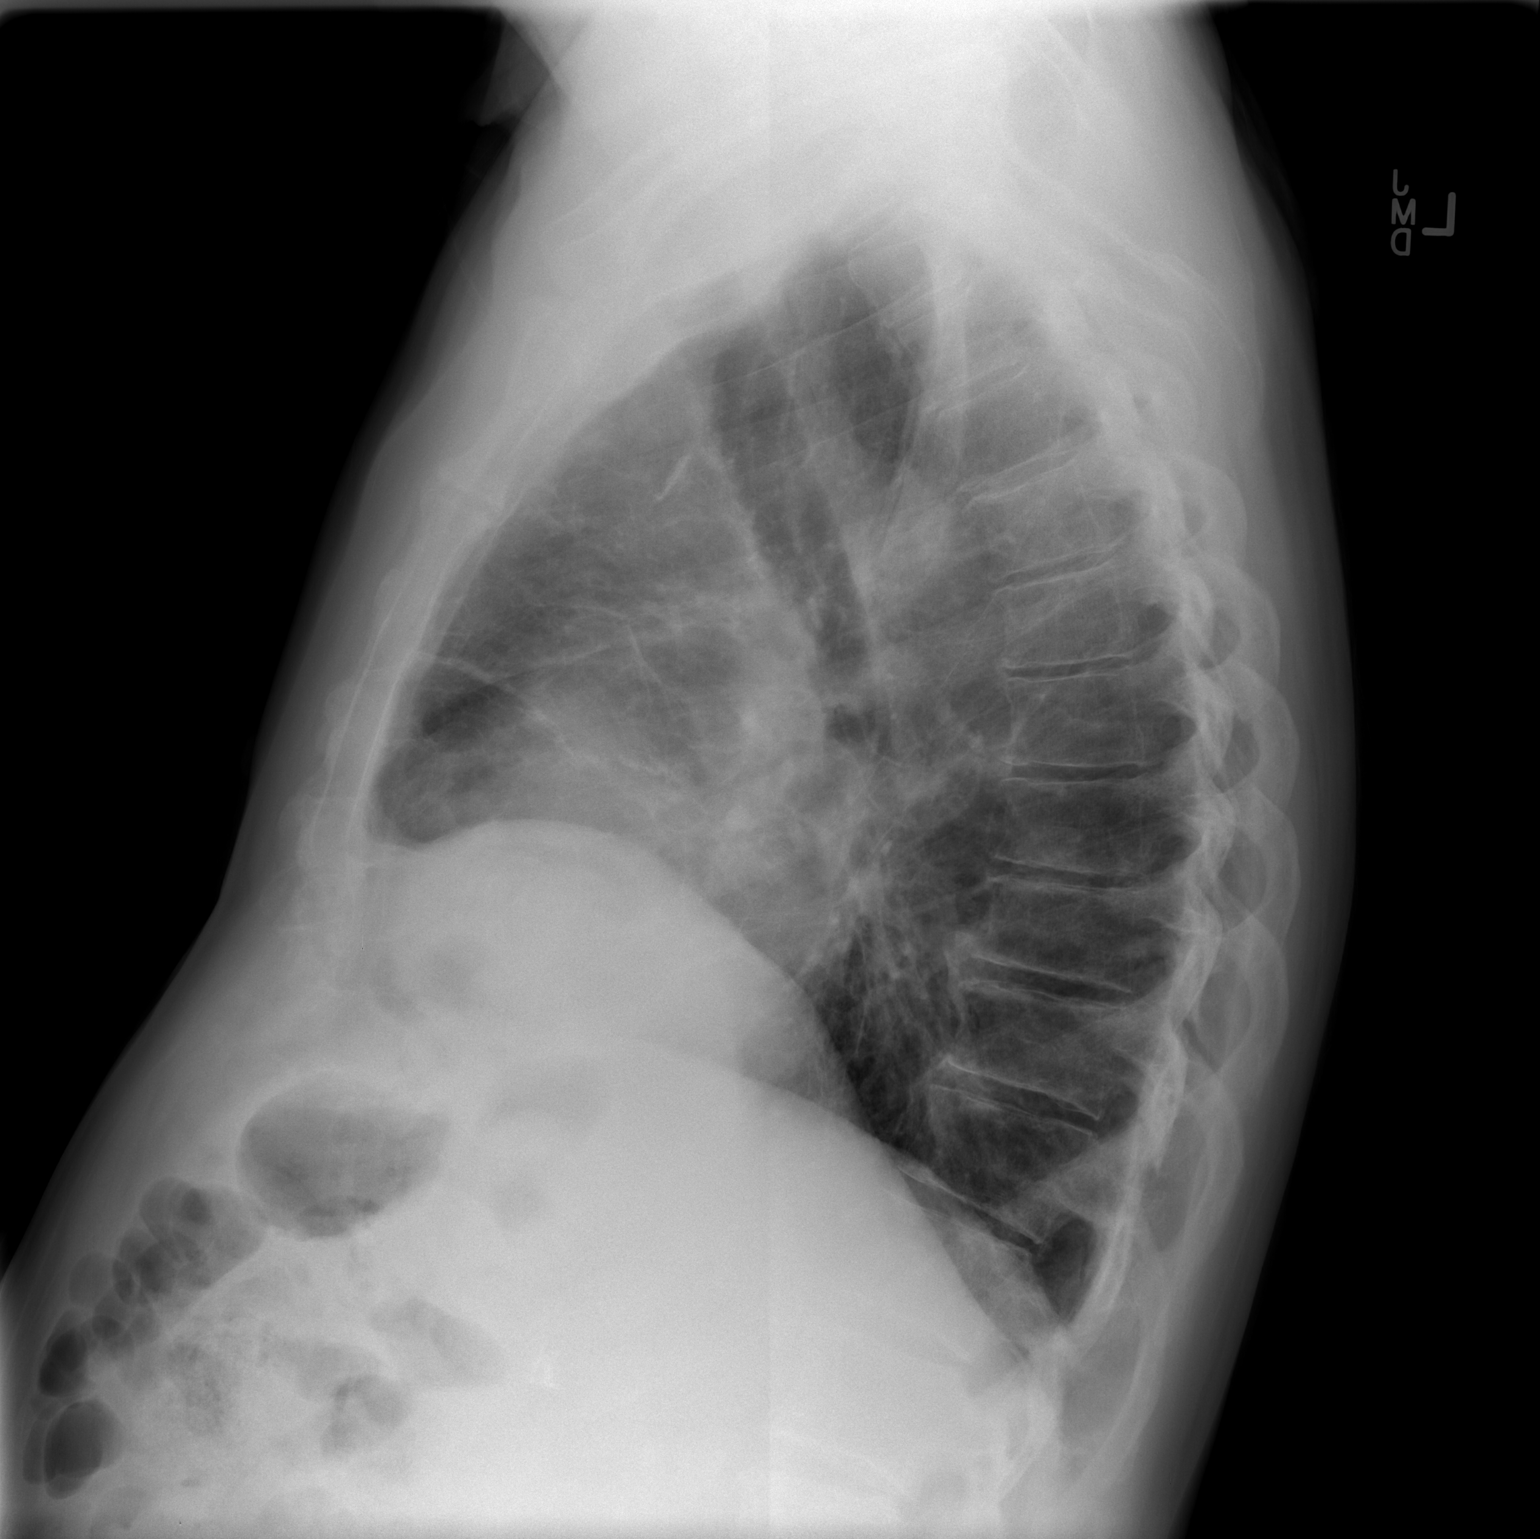

[2 of 2 positions shown; findings below may reference images not displayed]

FINDINGS: The cardiac silhouette and mediastinal contours are unchanged.

Linear opacities in the right lower lobe likely correspond to
scarring or subsegmental atelectasis. There is no focal airspace
consolidation, pneumothorax or overt pulmonary edema. Chronic
blunting of the right costophrenic angle is present. Linear left
lung base opacities are present. There is no left pleural effusion.

Degenerative changes of the spine are noted.

Surgical clips project over the right upper quadrant.
IMPRESSION: 1. No active cardiopulmonary disease.
2. Bibasilar scarring/atelectasis.

## 2015-10-30 ENCOUNTER — Other Ambulatory Visit: Payer: Self-pay | Admitting: Internal Medicine

## 2015-11-24 ENCOUNTER — Other Ambulatory Visit (INDEPENDENT_AMBULATORY_CARE_PROVIDER_SITE_OTHER): Payer: Medicare Other

## 2015-11-24 DIAGNOSIS — E038 Other specified hypothyroidism: Secondary | ICD-10-CM | POA: Diagnosis not present

## 2015-11-24 LAB — TSH: TSH: 9.88 u[IU]/mL — AB (ref 0.35–4.50)

## 2015-11-26 ENCOUNTER — Other Ambulatory Visit: Payer: Self-pay | Admitting: Internal Medicine

## 2015-11-26 DIAGNOSIS — E038 Other specified hypothyroidism: Secondary | ICD-10-CM

## 2015-11-26 MED ORDER — LEVOTHYROXINE SODIUM 50 MCG PO TABS: 50.0000 ug | ORAL_TABLET | Freq: Every day | ORAL | Status: DC

## 2016-01-26 DIAGNOSIS — M5136 Other intervertebral disc degeneration, lumbar region: Secondary | ICD-10-CM | POA: Diagnosis not present

## 2016-01-26 DIAGNOSIS — M791 Myalgia: Secondary | ICD-10-CM | POA: Diagnosis not present

## 2016-01-26 DIAGNOSIS — M0579 Rheumatoid arthritis with rheumatoid factor of multiple sites without organ or systems involvement: Secondary | ICD-10-CM | POA: Diagnosis not present

## 2016-01-26 DIAGNOSIS — M150 Primary generalized (osteo)arthritis: Secondary | ICD-10-CM | POA: Diagnosis not present

## 2016-01-28 ENCOUNTER — Other Ambulatory Visit (INDEPENDENT_AMBULATORY_CARE_PROVIDER_SITE_OTHER): Payer: Medicare Other

## 2016-01-28 DIAGNOSIS — E038 Other specified hypothyroidism: Secondary | ICD-10-CM

## 2016-01-28 LAB — TSH: TSH: 4.61 u[IU]/mL — AB (ref 0.35–4.50)

## 2016-01-30 ENCOUNTER — Other Ambulatory Visit: Payer: Self-pay | Admitting: Internal Medicine

## 2016-01-30 MED ORDER — LEVOTHYROXINE SODIUM 50 MCG PO TABS: 50.0000 ug | ORAL_TABLET | Freq: Every day | ORAL | 3 refills | Status: DC

## 2016-02-01 ENCOUNTER — Telehealth: Payer: Self-pay | Admitting: Emergency Medicine

## 2016-02-01 DIAGNOSIS — E039 Hypothyroidism, unspecified: Secondary | ICD-10-CM

## 2016-02-01 NOTE — Telephone Encounter (Signed)
error 

## 2016-02-16 DIAGNOSIS — M5136 Other intervertebral disc degeneration, lumbar region: Secondary | ICD-10-CM | POA: Diagnosis not present

## 2016-03-27 ENCOUNTER — Encounter: Payer: Self-pay | Admitting: Neurology

## 2016-03-27 ENCOUNTER — Ambulatory Visit (INDEPENDENT_AMBULATORY_CARE_PROVIDER_SITE_OTHER): Payer: Medicare Other | Admitting: Neurology

## 2016-03-27 VITALS — BP 135/77 | HR 61 | Ht 70.0 in | Wt 189.5 lb

## 2016-03-27 DIAGNOSIS — R269 Unspecified abnormalities of gait and mobility: Secondary | ICD-10-CM

## 2016-03-27 DIAGNOSIS — G3184 Mild cognitive impairment, so stated: Secondary | ICD-10-CM

## 2016-03-27 DIAGNOSIS — M069 Rheumatoid arthritis, unspecified: Secondary | ICD-10-CM

## 2016-03-27 DIAGNOSIS — G250 Essential tremor: Secondary | ICD-10-CM | POA: Diagnosis not present

## 2016-03-27 DIAGNOSIS — M545 Low back pain, unspecified: Secondary | ICD-10-CM | POA: Insufficient documentation

## 2016-03-27 DIAGNOSIS — G8929 Other chronic pain: Secondary | ICD-10-CM | POA: Diagnosis not present

## 2016-03-27 DIAGNOSIS — M5441 Lumbago with sciatica, right side: Secondary | ICD-10-CM

## 2016-03-27 MED ORDER — PRIMIDONE 50 MG PO TABS: 100.0000 mg | ORAL_TABLET | Freq: Three times a day (TID) | ORAL | 4 refills | Status: DC

## 2016-03-27 NOTE — Progress Notes (Signed)
Chief Complaint  Patient presents with  . Tremors    He is here for his yearly follow up.  He is still taking Primidone 50mg , 2 tabs TID.  Feels his tremors have become worse and he is having more difficulty writing.     [facility]  PATIENT: Ethan TRUDO DOB: [birth year]   REASON FOR VISIT: Follow-up for essential tremor  HISTORY OF PRESENT ILLNESS:Mr Hemenway, 81 year old white male returns for followup. He has history of bilateral hand tremor for greater than 12 years.  He has been a patient of our clinic Since 2010, bilateral hands, and head tremor, no family history of tremor, retired now but wants to continue medication because of difficulty writing, using keyboard,  knee replacement, mild gait difficulty. He is currently on primidone 100 mg twice daily with good benefit. He denies any side effects of the medication. He has past medical history of depression,anxiety, hyperlipidemia, coronary artery disease.  No gait difficulty, no limitation on his daily activity, performs all activities of daily living without difficulty. Has a history of ringing in his ears and hearing loss due to working in a noisy environment for years. He gets very little exercise. He returns for reevaluation  UPDATE Mar 25 2015: He still has bilateral hand tremor,  Head titubation, voice stammering, no gait diffiuclty, no Significant loss of smell, He wake up a lot in the middle of the night,  He has lumbar degenerative disc disease , also rheumatoid arthritis, complains of low back pain, multiple joints pain, could not exercise much..  He is taking primidone 50mg  ii bid, which seems to help him, no significant side effect,  UPDATE Mar 27 2016: He complains of worsening hand tremor, voice tremor, head titubation, he is still taking tramadol 50 mg 2 tablets 3 times a day, which help his tremor some  He also has mild memory loss, he has trouble with name, he lives with his wife, drove to  clinic without any difficulty today, He has chronic low back pain, radiating pain to right lower extremity, gait abnormality, was under the care of orthopedic surgeon had epidural injection without significant improvement, denies bowel and bladder incontinence,  He has rheumatoid arthritis    REVIEW OF SYSTEMS: Full 14 system review of systems performed and notable only for those listed, all others are neg:   as above  ALLERGIES: Allergies  Allergen Reactions  . Sulfonamide Derivatives Rash    Medi Alert bracelet  is recommended  . Atorvastatin Other (See Comments)    Cramping    . Pravastatin Sodium Other (See Comments)    Cramp   . Simvastatin Other (See Comments)    Cramps     HOME MEDICATIONS: Outpatient Medications Prior to Visit  Medication Sig Dispense Refill  . acetaminophen (TYLENOL) 500 MG tablet Take 1,000 mg by mouth every 6 (six) hours as needed for moderate pain or headache. Reported on 09/20/2015    . aspirin 325 MG tablet Take 325 mg by mouth daily.    . DULoxetine (CYMBALTA) 60 MG capsule     . hydroxychloroquine (PLAQUENIL) 200 MG tablet     . levothyroxine (SYNTHROID, LEVOTHROID) 50 MCG tablet Take 1 tablet (50 mcg total) by mouth daily. Except once a week take two pills 102 tablet 3  . methotrexate 50 MG/2ML injection     . metoprolol succinate (TOPROL-XL) 25 MG 24 hr tablet TAKE ONE TABLET BY MOUTH ONCE DAILY 90 tablet 2  . polyethylene glycol (MIRALAX /  GLYCOLAX) packet Take 17 g by mouth daily as needed for mild constipation. 14 each 0  . predniSONE (DELTASONE) 5 MG tablet Take 5 mg by mouth every morning.     . primidone (MYSOLINE) 50 MG tablet Take 2 tablets (100 mg total) by mouth 3 (three) times daily. 540 tablet 3  . rosuvastatin (CRESTOR) 20 MG tablet Take 1 tablet (20 mg total) by mouth at bedtime. 90 tablet 1  . sulfaSALAzine (AZULFIDINE) 500 MG tablet Take 1,000 mg by mouth 2 (two) times daily.     No facility-administered medications prior to  visit.     PAST MEDICAL HISTORY: Past Medical History:  Diagnosis Date  . Anemia 2010   H/H  12.9/38  . Asthma    hx of   . CAD (coronary artery disease)    Dr Gala Romney  . Depression   . Diverticulosis 2004   [facility]; due 2014  . GERD (gastroesophageal reflux disease)    NO MEDS  . Movement disorder    essential tremors  . Other and unspecified hyperlipidemia   . RA (rheumatoid arthritis) (HCC)    Dr Dierdre Forth  AND OA BOTH KNEES AND HANDS  . Unspecified essential hypertension     PAST SURGICAL HISTORY: Past Surgical History:  Procedure Laterality Date  . ANGIOPLASTY  1997   3 stents  . BACK SURGERY  1995   for ruptured disc LS spine  . CARDIAC CATHETERIZATION     coronary stents x3- placed 20 yrs ago.  Marland Kitchen CARPAL TUNNEL RELEASE     bilateral  . CATARACT EXTRACTION, BILATERAL  2011  . CHOLECYSTECTOMY N/A 02/13/2013   Procedure: LAPAROSCOPIC CHOLECYSTECTOMY WITH INTRAOPERATIVE CHOLANGIOGRAM;  Surgeon: Clovis Pu. Cornett, MD;  Location: [facility];  Service: General;  Laterality: N/A;  . COLONOSCOPY  2004   Diverticulosis  . KNEE SURGERY     x 2  . post op transfusion  9/14   for anemia  . PROSTATE SURGERY     BPH  . SHOULDER SURGERY  2005  . TONSILLECTOMY    . TOTAL KNEE ARTHROPLASTY  08/07/2011   Procedure: TOTAL KNEE ARTHROPLASTY;  Surgeon: Loanne Drilling, MD;  Location: [facility];  Service: Orthopedics;  Laterality: Right;  . TOTAL KNEE ARTHROPLASTY Left 08/03/2014   Procedure: LEFT TOTAL KNEE ARTHROPLASTY;  Surgeon: Ollen Gross, MD;  Location: [facility];  Service: Orthopedics;  Laterality: Left;  . TRANSURETHRAL RESECTION OF PROSTATE N/A 04/10/2014   Procedure: TRANSURETHRAL RESECTION OF THE PROSTATE WITH GYRUS INSTRUMENTS;  Surgeon: Garnett Farm, MD;  Location: [facility];  Service: Urology;  Laterality: N/A;  . TRIGGER FINGER RELEASE     multiple    FAMILY HISTORY: Family History  Problem Relation Age of Onset  . Cancer Father     esophagus  . Stroke Mother      in mid 11s  . Arthritis Mother   . Diabetes Brother   . Heart attack Paternal Grandmother     ? in 26s  . Diabetes Maternal Aunt   . Prostate cancer Maternal Uncle   . Cancer Maternal Uncle   . Prostate cancer Cousin   . Cancer Cousin   . Liver cancer      Paternal family history  . Leukemia      Paternal family history  . Cancer Cousin     SOCIAL HISTORY: Social History   Social History  . Marital status: Married    Spouse name: Steward Drone  . Number of children: 1  .  Years of education: 412   Occupational History  .      retired   Social History Main Topics  . Smoking status: Former Smoker    Packs/day: 2.00    Types: Cigarettes    Quit date: 05/29/1972  . Smokeless tobacco: Never Used     Comment: smoked 1957-1974, up to 2 ppd  occ alcohol  . Alcohol use 0.6 oz/week    1 Cans of beer per week     Comment: beer rarely  . Drug use: No  . Sexual activity: Not on file   Other Topics Concern  . Not on file   Social History Narrative   Patient lives with his wife Steward Drone(Brenda).   Patient has 1 child.   Patient has a high school education.   Patient is right-handed.   Patient is retired.      Exercise: no     PHYSICAL EXAM  Vitals:   03/27/16 1603  BP: 135/77  Pulse: 61  Weight: 189 lb 8 oz (86 kg)  Height: 5\' 10"  (1.778 m)   Body mass index is 27.19 kg/m.   PHYSICAL EXAMNIATION:  Gen: NAD, conversant, well nourised, obese, well groomed                     Cardiovascular: Regular rate rhythm, no peripheral edema, warm, nontender. Eyes: Conjunctivae clear without exudates or hemorrhage Neck: Supple, no carotid bruise. Pulmonary: Clear to auscultation bilaterally   NEUROLOGICAL EXAM:  MENTAL STATUS: Speech:    Speech is normal; fluent and spontaneous with normal comprehension.  Cognition:     Orientation to time, place and person     Normal recent and remote memory     Normal Attention span and concentration     Normal Language, naming,  repeating,spontaneous speech     Fund of knowledge   CRANIAL NERVES: CN II: Visual fields are full to confrontation. Fundoscopic exam is normal with sharp discs and no vascular changes. Pupils are round equal and briskly reactive to light. CN III, IV, VI: extraocular movement are normal. No ptosis. CN V: Facial sensation is intact to pinprick in all 3 divisions bilaterally. Corneal responses are intact.  CN VII: Face is symmetric with normal eye closure and smile. CN VIII: Hearing is normal to rubbing fingers CN IX, X: Palate elevates symmetrically. Phonation is normal. CN XI: Head turning and shoulder shrug are intact CN XII: Tongue is midline with normal movements and no atrophy.  MOTOR:  mild bilateral hands postural tremor, head titubation, no significant rigidity , bradykinesia. He has mild right ankle dorsiflexion weakness   REFLEXES: Reflexes are 2+ and symmetric at the biceps, triceps, knees, and ankles. Plantar responses are flexor.  SENSORY: Intact to light touch, pinprick, position sense, and vibration sense are intact in fingers and toes.  COORDINATION: Rapid alternating movements and fine finger movements are intact. There is no dysmetria on finger-to-nose and heel-knee-shin.    GAIT/STANCE:  he needs push up to get up from seated position, mild bilateral foot drop right worse than left    DIAGNOSTIC DATA (LABS, IMAGING, TESTING) - I reviewed patient records, labs, notes, testing and imaging myself where available.  Lab Results  Component Value Date   WBC 9.1 09/20/2015   HGB 12.2 (L) 09/20/2015   HCT 36.0 (L) 09/20/2015   MCV 98.0 09/20/2015   PLT 283.0 09/20/2015      Component Value Date/Time   NA 136 09/20/2015 1522   K 4.5  09/20/2015 1522   CL 102 09/20/2015 1522   CO2 26 09/20/2015 1522   GLUCOSE 99 09/20/2015 1522   BUN 21 09/20/2015 1522   CREATININE 0.98 09/20/2015 1522   CALCIUM 10.0 09/20/2015 1522   PROT 8.1 09/20/2015 1522   ALBUMIN 4.5  09/20/2015 1522   AST 22 09/20/2015 1522   ALT 14 09/20/2015 1522   ALKPHOS 39 09/20/2015 1522   BILITOT 0.4 09/20/2015 1522   GFRNONAA 86 (L) 08/05/2014 0430   GFRAA >90 08/05/2014 0430    Lab Results  Component Value Date   HGBA1C 5.6 09/20/2015     ASSESSMENT AND PLAN  81 y.o. year old male    Worsening essential tremor  Keep primidone to 50 mg 2 tablets 3 times a day  Chronic low back pain with evidence of right lumbar sacral radiculopathy Gait abnormality with right foot drop  Levert Feinstein, M.D. Ph.D.  [facility] [address] [place], [place] [address] Phone: [phone] Fax:      [phone]

## 2016-03-31 ENCOUNTER — Other Ambulatory Visit (INDEPENDENT_AMBULATORY_CARE_PROVIDER_SITE_OTHER): Payer: Medicare Other

## 2016-03-31 DIAGNOSIS — E039 Hypothyroidism, unspecified: Secondary | ICD-10-CM | POA: Diagnosis not present

## 2016-03-31 LAB — TSH: TSH: 5.8 u[IU]/mL — AB (ref 0.35–4.50)

## 2016-04-04 ENCOUNTER — Other Ambulatory Visit: Payer: Self-pay | Admitting: Emergency Medicine

## 2016-04-04 DIAGNOSIS — E039 Hypothyroidism, unspecified: Secondary | ICD-10-CM

## 2016-04-04 MED ORDER — LEVOTHYROXINE SODIUM 88 MCG PO TABS: 88.0000 ug | ORAL_TABLET | Freq: Every day | ORAL | 0 refills | Status: DC

## 2016-04-27 DIAGNOSIS — M791 Myalgia: Secondary | ICD-10-CM | POA: Diagnosis not present

## 2016-04-27 DIAGNOSIS — M0579 Rheumatoid arthritis with rheumatoid factor of multiple sites without organ or systems involvement: Secondary | ICD-10-CM | POA: Diagnosis not present

## 2016-04-27 DIAGNOSIS — M150 Primary generalized (osteo)arthritis: Secondary | ICD-10-CM | POA: Diagnosis not present

## 2016-04-27 DIAGNOSIS — M5136 Other intervertebral disc degeneration, lumbar region: Secondary | ICD-10-CM | POA: Diagnosis not present

## 2016-05-25 DIAGNOSIS — M9904 Segmental and somatic dysfunction of sacral region: Secondary | ICD-10-CM | POA: Diagnosis not present

## 2016-05-25 DIAGNOSIS — M5417 Radiculopathy, lumbosacral region: Secondary | ICD-10-CM | POA: Diagnosis not present

## 2016-05-25 DIAGNOSIS — M9903 Segmental and somatic dysfunction of lumbar region: Secondary | ICD-10-CM | POA: Diagnosis not present

## 2016-05-25 DIAGNOSIS — M5137 Other intervertebral disc degeneration, lumbosacral region: Secondary | ICD-10-CM | POA: Diagnosis not present

## 2016-05-25 DIAGNOSIS — M9902 Segmental and somatic dysfunction of thoracic region: Secondary | ICD-10-CM | POA: Diagnosis not present

## 2016-05-30 DIAGNOSIS — M9903 Segmental and somatic dysfunction of lumbar region: Secondary | ICD-10-CM | POA: Diagnosis not present

## 2016-05-30 DIAGNOSIS — M5137 Other intervertebral disc degeneration, lumbosacral region: Secondary | ICD-10-CM | POA: Diagnosis not present

## 2016-05-30 DIAGNOSIS — M5417 Radiculopathy, lumbosacral region: Secondary | ICD-10-CM | POA: Diagnosis not present

## 2016-05-30 DIAGNOSIS — M9904 Segmental and somatic dysfunction of sacral region: Secondary | ICD-10-CM | POA: Diagnosis not present

## 2016-05-30 DIAGNOSIS — M9902 Segmental and somatic dysfunction of thoracic region: Secondary | ICD-10-CM | POA: Diagnosis not present

## 2016-06-05 ENCOUNTER — Other Ambulatory Visit (INDEPENDENT_AMBULATORY_CARE_PROVIDER_SITE_OTHER): Payer: Medicare Other

## 2016-06-05 DIAGNOSIS — E039 Hypothyroidism, unspecified: Secondary | ICD-10-CM

## 2016-06-05 LAB — TSH: TSH: 2.82 u[IU]/mL (ref 0.35–4.50)

## 2016-06-07 DIAGNOSIS — M9904 Segmental and somatic dysfunction of sacral region: Secondary | ICD-10-CM | POA: Diagnosis not present

## 2016-06-07 DIAGNOSIS — M5417 Radiculopathy, lumbosacral region: Secondary | ICD-10-CM | POA: Diagnosis not present

## 2016-06-07 DIAGNOSIS — M9902 Segmental and somatic dysfunction of thoracic region: Secondary | ICD-10-CM | POA: Diagnosis not present

## 2016-06-07 DIAGNOSIS — M5137 Other intervertebral disc degeneration, lumbosacral region: Secondary | ICD-10-CM | POA: Diagnosis not present

## 2016-06-07 DIAGNOSIS — M9903 Segmental and somatic dysfunction of lumbar region: Secondary | ICD-10-CM | POA: Diagnosis not present

## 2016-07-12 ENCOUNTER — Other Ambulatory Visit: Payer: Self-pay | Admitting: Internal Medicine

## 2016-07-26 DIAGNOSIS — M5136 Other intervertebral disc degeneration, lumbar region: Secondary | ICD-10-CM | POA: Diagnosis not present

## 2016-07-26 DIAGNOSIS — M150 Primary generalized (osteo)arthritis: Secondary | ICD-10-CM | POA: Diagnosis not present

## 2016-07-26 DIAGNOSIS — M791 Myalgia: Secondary | ICD-10-CM | POA: Diagnosis not present

## 2016-07-26 DIAGNOSIS — M0579 Rheumatoid arthritis with rheumatoid factor of multiple sites without organ or systems involvement: Secondary | ICD-10-CM | POA: Diagnosis not present

## 2016-08-07 ENCOUNTER — Other Ambulatory Visit: Payer: Self-pay | Admitting: Internal Medicine

## 2016-09-11 ENCOUNTER — Other Ambulatory Visit: Payer: Self-pay | Admitting: Internal Medicine

## 2016-09-12 ENCOUNTER — Other Ambulatory Visit: Payer: Self-pay | Admitting: Internal Medicine

## 2016-09-12 ENCOUNTER — Other Ambulatory Visit: Payer: Self-pay | Admitting: Neurology

## 2016-09-15 ENCOUNTER — Telehealth: Payer: Self-pay | Admitting: Internal Medicine

## 2016-09-15 NOTE — Telephone Encounter (Signed)
Received refill for Prednisone 5mg  tab. Please advise if okay to refill.

## 2016-09-16 NOTE — Telephone Encounter (Signed)
Ok to refill - it is for his RA -

## 2016-09-18 MED ORDER — PREDNISONE 5 MG PO TABS: 5.0000 mg | ORAL_TABLET | Freq: Every morning | ORAL | 0 refills | Status: DC

## 2016-09-18 NOTE — Telephone Encounter (Signed)
RX has been sent, pt needs appt for further refills.

## 2016-09-19 ENCOUNTER — Other Ambulatory Visit (INDEPENDENT_AMBULATORY_CARE_PROVIDER_SITE_OTHER): Payer: Medicare Other

## 2016-09-19 ENCOUNTER — Encounter: Payer: Self-pay | Admitting: Internal Medicine

## 2016-09-19 ENCOUNTER — Ambulatory Visit (INDEPENDENT_AMBULATORY_CARE_PROVIDER_SITE_OTHER): Payer: Medicare Other | Admitting: Internal Medicine

## 2016-09-19 VITALS — BP 136/66 | HR 66 | Ht 70.0 in | Wt 190.0 lb

## 2016-09-19 DIAGNOSIS — R7301 Impaired fasting glucose: Secondary | ICD-10-CM

## 2016-09-19 DIAGNOSIS — D539 Nutritional anemia, unspecified: Secondary | ICD-10-CM

## 2016-09-19 DIAGNOSIS — Z0000 Encounter for general adult medical examination without abnormal findings: Secondary | ICD-10-CM

## 2016-09-19 DIAGNOSIS — E038 Other specified hypothyroidism: Secondary | ICD-10-CM

## 2016-09-19 DIAGNOSIS — M069 Rheumatoid arthritis, unspecified: Secondary | ICD-10-CM | POA: Diagnosis not present

## 2016-09-19 DIAGNOSIS — G250 Essential tremor: Secondary | ICD-10-CM

## 2016-09-19 DIAGNOSIS — I119 Hypertensive heart disease without heart failure: Secondary | ICD-10-CM

## 2016-09-19 DIAGNOSIS — E7800 Pure hypercholesterolemia, unspecified: Secondary | ICD-10-CM | POA: Diagnosis not present

## 2016-09-19 LAB — CBC WITH DIFFERENTIAL/PLATELET
BASOS PCT: 0.8 % (ref 0.0–3.0)
Basophils Absolute: 0.1 10*3/uL (ref 0.0–0.1)
EOS ABS: 0.3 10*3/uL (ref 0.0–0.7)
Eosinophils Relative: 4.5 % (ref 0.0–5.0)
HCT: 33.8 % — ABNORMAL LOW (ref 39.0–52.0)
HEMOGLOBIN: 11.5 g/dL — AB (ref 13.0–17.0)
Lymphocytes Relative: 22.8 % (ref 12.0–46.0)
Lymphs Abs: 1.6 10*3/uL (ref 0.7–4.0)
MCHC: 34 g/dL (ref 30.0–36.0)
MCV: 99.6 fl (ref 78.0–100.0)
MONO ABS: 1.3 10*3/uL — AB (ref 0.1–1.0)
Monocytes Relative: 17.5 % — ABNORMAL HIGH (ref 3.0–12.0)
NEUTROS ABS: 3.9 10*3/uL (ref 1.4–7.7)
Neutrophils Relative %: 54.4 % (ref 43.0–77.0)
PLATELETS: 247 10*3/uL (ref 150.0–400.0)
RBC: 3.4 Mil/uL — ABNORMAL LOW (ref 4.22–5.81)
RDW: 14.2 % (ref 11.5–15.5)
WBC: 7.2 10*3/uL (ref 4.0–10.5)

## 2016-09-19 LAB — COMPREHENSIVE METABOLIC PANEL
ALT: 18 U/L (ref 0–53)
AST: 24 U/L (ref 0–37)
Albumin: 4.3 g/dL (ref 3.5–5.2)
Alkaline Phosphatase: 34 U/L — ABNORMAL LOW (ref 39–117)
BUN: 15 mg/dL (ref 6–23)
CHLORIDE: 102 meq/L (ref 96–112)
CO2: 28 mEq/L (ref 19–32)
CREATININE: 0.92 mg/dL (ref 0.40–1.50)
Calcium: 9.4 mg/dL (ref 8.4–10.5)
GFR: 84.17 mL/min (ref >60.00)
Glucose, Bld: 104 mg/dL — ABNORMAL HIGH (ref 70–99)
Potassium: 4.5 mEq/L (ref 3.5–5.1)
SODIUM: 136 meq/L (ref 135–145)
Total Bilirubin: 0.4 mg/dL (ref 0.2–1.2)
Total Protein: 7.4 g/dL (ref 6.0–8.3)

## 2016-09-19 LAB — HEMOGLOBIN A1C: Hgb A1c MFr Bld: 5.6 % (ref 4.6–6.5)

## 2016-09-19 LAB — LIPID PANEL
Cholesterol: 138 mg/dL (ref 0–200)
HDL: 53 mg/dL (ref >39.00)
LDL CALC: 70 mg/dL (ref 0–99)
NONHDL: 84.91
Total CHOL/HDL Ratio: 3
Triglycerides: 77 mg/dL (ref 0.0–149.0)
VLDL: 15.4 mg/dL (ref 0.0–40.0)

## 2016-09-19 LAB — IRON: Iron: 107 ug/dL (ref 42–165)

## 2016-09-19 LAB — FERRITIN: Ferritin: 173.8 ng/mL (ref 22.0–322.0)

## 2016-09-19 LAB — TSH: TSH: 2.36 u[IU]/mL (ref 0.35–4.50)

## 2016-09-19 NOTE — Assessment & Plan Note (Signed)
Taking primidone

## 2016-09-19 NOTE — Assessment & Plan Note (Signed)
Check a1c 

## 2016-09-19 NOTE — Patient Instructions (Addendum)
Mr. Ethan Robinson , Thank you for taking time to come for your Medicare Wellness Visit. I appreciate your ongoing commitment to your health goals. Please review the following plan we discussed and let me know if I can assist you in the future.   These are the goals we discussed: Goals    Increase exercise      This is a list of the screening recommended for you and due dates:  Health Maintenance  Topic Date Due  . Flu Shot  12/27/2016  . Tetanus Vaccine  12/18/2017  . Pneumonia vaccines  Completed     Test(s) ordered today. Your results will be released to MyChart (or called to you) after review, usually within 72hours after test completion. If any changes need to be made, you will be notified at that same time.  All other Health Maintenance issues reviewed.   All recommended immunizations and age-appropriate screenings are up-to-date or discussed.  No immunizations administered today.   Medications reviewed and updated.  No changes recommended at this time.    Please followup in one year    Health Maintenance, Male A healthy lifestyle and preventive care is important for your health and wellness. Ask your health care provider about what schedule of regular examinations is right for you. What should I know about weight and diet?  Eat a Healthy Diet  Eat plenty of vegetables, fruits, whole grains, low-fat dairy products, and lean protein.  Do not eat a lot of foods high in solid fats, added sugars, or salt. Maintain a Healthy Weight  Regular exercise can help you achieve or maintain a healthy weight. You should:  Do at least 150 minutes of exercise each week. The exercise should increase your heart rate and make you sweat (moderate-intensity exercise).  Do strength-training exercises at least twice a week. Watch Your Levels of Cholesterol and Blood Lipids  Have your blood tested for lipids and cholesterol every 5 years starting at 80 years of age. If you are at high risk for  heart disease, you should start having your blood tested when you are 80 years old. You may need to have your cholesterol levels checked more often if:  Your lipid or cholesterol levels are high.  You are older than 80 years of age.  You are at high risk for heart disease. What should I know about cancer screening? Many types of cancers can be detected early and may often be prevented. Lung Cancer  You should be screened every year for lung cancer if:  You are a current smoker who has smoked for at least 30 years.  You are a former smoker who has quit within the past 15 years.  Talk to your health care provider about your screening options, when you should start screening, and how often you should be screened. Colorectal Cancer  Routine colorectal cancer screening usually begins at 80 years of age and should be repeated every 5-10 years until you are 80 years old. You may need to be screened more often if early forms of precancerous polyps or small growths are found. Your health care provider may recommend screening at an earlier age if you have risk factors for colon cancer.  Your health care provider may recommend using home test kits to check for hidden blood in the stool.  A small camera at the end of a tube can be used to examine your colon (sigmoidoscopy or colonoscopy). This checks for the earliest forms of colorectal cancer. Prostate and Testicular  Cancer  Depending on your age and overall health, your health care provider may do certain tests to screen for prostate and testicular cancer.  Talk to your health care provider about any symptoms or concerns you have about testicular or prostate cancer. Skin Cancer  Check your skin from head to toe regularly.  Tell your health care provider about any new moles or changes in moles, especially if:  There is a change in a mole's size, shape, or color.  You have a mole that is larger than a pencil eraser.  Always use sunscreen.  Apply sunscreen liberally and repeat throughout the day.  Protect yourself by wearing long sleeves, pants, a wide-brimmed hat, and sunglasses when outside. What should I know about heart disease, diabetes, and high blood pressure?  If you are 29-49 years of age, have your blood pressure checked every 3-5 years. If you are 63 years of age or older, have your blood pressure checked every year. You should have your blood pressure measured twice-once when you are at a hospital or clinic, and once when you are not at a hospital or clinic. Record the average of the two measurements. To check your blood pressure when you are not at a hospital or clinic, you can use:  An automated blood pressure machine at a pharmacy.  A home blood pressure monitor.  Talk to your health care provider about your target blood pressure.  If you are between 51-30 years old, ask your health care provider if you should take aspirin to prevent heart disease.  Have regular diabetes screenings by checking your fasting blood sugar level.  If you are at a normal weight and have a low risk for diabetes, have this test once every three years after the age of 32.  If you are overweight and have a high risk for diabetes, consider being tested at a younger age or more often.  A one-time screening for abdominal aortic aneurysm (AAA) by ultrasound is recommended for men aged 65-75 years who are current or former smokers. What should I know about preventing infection? Hepatitis B  If you have a higher risk for hepatitis B, you should be screened for this virus. Talk with your health care provider to find out if you are at risk for hepatitis B infection. Hepatitis C  Blood testing is recommended for:  Everyone born from [birth year] through 1963.  Anyone with known risk factors for hepatitis C. Sexually Transmitted Diseases (STDs)  You should be screened each year for STDs including gonorrhea and chlamydia if:  You are sexually  active and are younger than 80 years of age.  You are older than 80 years of age and your health care provider tells you that you are at risk for this type of infection.  Your sexual activity has changed since you were last screened and you are at an increased risk for chlamydia or gonorrhea. Ask your health care provider if you are at risk.  Talk with your health care provider about whether you are at high risk of being infected with HIV. Your health care provider may recommend a prescription medicine to help prevent HIV infection. What else can I do?  Schedule regular health, dental, and eye exams.  Stay current with your vaccines (immunizations).  Do not use any tobacco products, such as cigarettes, chewing tobacco, and e-cigarettes. If you need help quitting, ask your health care provider.  Limit alcohol intake to no more than 2 drinks per day. One drink  equals 12 ounces of beer, 5 ounces of wine, or 1 ounces of hard liquor.  Do not use street drugs.  Do not share needles.  Ask your health care provider for help if you need support or information about quitting drugs.  Tell your health care provider if you often feel depressed.  Tell your health care provider if you have ever been abused or do not feel safe at home. This information is not intended to replace advice given to you by your health care provider. Make sure you discuss any questions you have with your health care provider. Document Released: 11/11/2007 Document Revised: 01/12/2016 Document Reviewed: 02/16/2015 Elsevier Interactive Patient Education  2017 [employer].

## 2016-09-19 NOTE — Assessment & Plan Note (Signed)
BP well controlled Current regimen effective and well tolerated Continue current medications at current doses  

## 2016-09-19 NOTE — Assessment & Plan Note (Signed)
Check tsh  Titrate med dose if needed  

## 2016-09-19 NOTE — Assessment & Plan Note (Addendum)
Management per Dr Dierdre Forth Takes prednisone only as needed

## 2016-09-19 NOTE — Progress Notes (Signed)
Subjective:    Patient ID: Ethan Robinson, male    DOB: 1934/06/02, 82 y.o.   MRN: [id number]  HPI Here for medicare wellness exam and annual physical exam.   I have personally reviewed and have noted 1.The patient's medical and social history 2.Their use of alcohol, tobacco or illicit drugs 3.Their current medications and supplements 4.The patient's functional ability including ADL's, fall risks, home safety              risks and hearing or visual impairment. 5.Diet and physical activities 6.Evidence for depression or mood disorders 7.Care team reviewed  - neurology - dr Terrace Arabia, rheumatology - dr Dierdre Forth   Are there smokers in your home (other than you)? Yes, his wife  Risk Factors Exercise: No Dietary issues discussed: eats out a lot  Cardiac risk factors: advanced age, hypertension, hyperlipidemia  Depression Screen  Have you felt down, depressed or hopeless? No  Have you felt little interest or pleasure in doing things?  No  Activities of Daily Living In your present state of health, do you have any difficulty performing the following activities?:  Driving? No Managing money?  No Feeding yourself? No Getting from bed to chair? No Climbing a flight of stairs? No Preparing food and eating?: No Bathing or showering? No Getting dressed: No Getting to/using the toilet? No Moving around from place to place: No In the past year have you fallen or had a near fall?: ? Probably - has not gotten hurt   Are you sexually active?  No  Do you have more than one partner?  N/A  Hearing Difficulties:  Do you often ask people to speak up or repeat themselves? Yes  Do you experience ringing or noises in your ears? Yes, mild Do you have difficulty understanding soft or whispered voices? Yes  Vision:              Any change in vision:  no             Up to date with eye exam: yes Memory:  Do you feel that you  have a problem with memory? Yes, has seen neurology  Do you often misplace items? occasionally  Do you feel safe at home?  Yes  Cognitive Testing  Alert, Orientated? Yes  Normal Appearance? Yes  Recall of three objects?  Yes  Can perform simple calculations? Yes  Displays appropriate judgment? Yes  Can read the correct time from a watch face? Yes   Advanced Directives have been discussed with the patient? Yes - he has discussed with his wife     Medications and allergies reviewed with patient and updated if appropriate.  Patient Active Problem List   Diagnosis Date Noted  . Low back pain 03/27/2016  . Abnormality of gait 03/27/2016  . Hypothyroidism 09/23/2015  . Mild cognitive impairment 09/20/2015  . Elevated TSH 02/03/2015  . BPH (benign prostatic hypertrophy) with urinary obstruction 04/10/2014  . Gallstones 01/31/2013  . Back pain, thoracic 01/15/2013  . Right lumbar radiculopathy 01/15/2013  . OA (osteoarthritis) of knee 08/07/2011  . VENTRAL HERNIA 01/11/2010  . Rheumatoid arthritis (HCC) 01/11/2010  . PAIN IN JOINT, MULTIPLE SITES 06/22/2009  . Hypertensive heart disease without CHF 05/04/2009  . PEPTIC ULCER DISEASE 04/29/2009  . ARTHRALGIA 04/29/2009  . Depression 07/13/2008  . COUGH, CHRONIC 04/15/2008  . DYSPHAGIA PHARYNGEAL PHASE 04/15/2008  . Essential tremor 12/19/2007  . Fasting hyperglycemia 12/19/2007  . Hyperlipidemia 07/22/2007  . Macrocytic anemia 07/22/2007  .  Coronary atherosclerosis 07/22/2007  . ALLERGIC RHINITIS 07/22/2007  . ASTHMA 07/22/2007  . GERD 07/22/2007  . DEGENERATIVE JOINT DISEASE 07/22/2007    Current Outpatient Prescriptions on File Prior to Visit  Medication Sig Dispense Refill  . acetaminophen (TYLENOL) 500 MG tablet Take 1,000 mg by mouth every 6 (six) hours as needed for moderate pain or headache. Reported on 09/20/2015    . aspirin 325 MG tablet Take 325 mg by mouth daily.    . DULoxetine (CYMBALTA) 60 MG capsule     .  hydroxychloroquine (PLAQUENIL) 200 MG tablet     . levothyroxine (SYNTHROID, LEVOTHROID) 88 MCG tablet TAKE 1 TABLET BY MOUTH ONCE DAILY 90 tablet 0  . methotrexate 50 MG/2ML injection     . metoprolol succinate (TOPROL-XL) 25 MG 24 hr tablet TAKE ONE TABLET BY MOUTH ONCE DAILY 90 tablet 0  . polyethylene glycol (MIRALAX / GLYCOLAX) packet Take 17 g by mouth daily as needed for mild constipation. 14 each 0  . predniSONE (DELTASONE) 5 MG tablet Take 1 tablet (5 mg total) by mouth every morning. --- Office visit needed for further refills 30 tablet 0  . primidone (MYSOLINE) 50 MG tablet Take 2 tablets (100 mg total) by mouth 3 (three) times daily. 540 tablet 4  . rosuvastatin (CRESTOR) 20 MG tablet TAKE ONE TABLET BY MOUTH AT BEDTIME 90 tablet 0  . sulfaSALAzine (AZULFIDINE) 500 MG tablet Take 1,000 mg by mouth 2 (two) times daily.    . [DISCONTINUED] Calcium Carbonate (CALCIUM 600 PO) Take 600 mg by mouth daily.      No current facility-administered medications on file prior to visit.     Past Medical History:  Diagnosis Date  . Anemia 2010   H/H  12.9/38  . Asthma    hx of   . CAD (coronary artery disease)    Dr Gala Romney  . Depression   . Diverticulosis 2004   [facility]; due 2014  . GERD (gastroesophageal reflux disease)    NO MEDS  . Movement disorder    essential tremors  . Other and unspecified hyperlipidemia   . RA (rheumatoid arthritis) (HCC)    Dr Dierdre Forth  AND OA BOTH KNEES AND HANDS  . Unspecified essential hypertension     Past Surgical History:  Procedure Laterality Date  . ANGIOPLASTY  1997   3 stents  . BACK SURGERY  1995   for ruptured disc LS spine  . CARDIAC CATHETERIZATION     coronary stents x3- placed 20 yrs ago.  Marland Kitchen CARPAL TUNNEL RELEASE     bilateral  . CATARACT EXTRACTION, BILATERAL  2011  . CHOLECYSTECTOMY N/A 02/13/2013   Procedure: LAPAROSCOPIC CHOLECYSTECTOMY WITH INTRAOPERATIVE CHOLANGIOGRAM;  Surgeon: Clovis Pu. Cornett, MD;  Location: [facility] OR;   Service: General;  Laterality: N/A;  . COLONOSCOPY  2004   Diverticulosis  . KNEE SURGERY     x 2  . post op transfusion  9/14   for anemia  . PROSTATE SURGERY     BPH  . SHOULDER SURGERY  2005  . TONSILLECTOMY    . TOTAL KNEE ARTHROPLASTY  08/07/2011   Procedure: TOTAL KNEE ARTHROPLASTY;  Surgeon: Loanne Drilling, MD;  Location: [facility];  Service: Orthopedics;  Laterality: Right;  . TOTAL KNEE ARTHROPLASTY Left 08/03/2014   Procedure: LEFT TOTAL KNEE ARTHROPLASTY;  Surgeon: Ollen Gross, MD;  Location: [facility];  Service: Orthopedics;  Laterality: Left;  . TRANSURETHRAL RESECTION OF PROSTATE N/A 04/10/2014   Procedure: TRANSURETHRAL RESECTION  OF THE PROSTATE WITH GYRUS INSTRUMENTS;  Surgeon: Garnett Farm, MD;  Location: [facility];  Service: Urology;  Laterality: N/A;  . TRIGGER FINGER RELEASE     multiple    Social History   Social History  . Marital status: Married    Spouse name: Steward Drone  . Number of children: 1  . Years of education: 64   Occupational History  .      retired   Social History Main Topics  . Smoking status: Former Smoker    Packs/day: 2.00    Types: Cigarettes    Quit date: 05/29/1972  . Smokeless tobacco: Never Used     Comment: smoked 1957-1974, up to 2 ppd  occ alcohol  . Alcohol use 0.6 oz/week    1 Cans of beer per week     Comment: beer rarely  . Drug use: No  . Sexual activity: Not Asked   Other Topics Concern  . None   Social History Narrative   Patient lives with his wife Steward Drone).   Patient has 1 child.   Patient has a high school education.   Patient is right-handed.   Patient is retired.      Exercise: no    Family History  Problem Relation Age of Onset  . Cancer Father     esophagus  . Stroke Mother     in mid 87s  . Arthritis Mother   . Diabetes Brother   . Heart attack Paternal Grandmother     ? in 40s  . Diabetes Maternal Aunt   . Prostate cancer Maternal Uncle   . Cancer Maternal Uncle   . Prostate cancer Cousin   .  Cancer Cousin   . Liver cancer      Paternal family history  . Leukemia      Paternal family history  . Cancer Cousin     Review of Systems  Constitutional: Negative for chills and fever.  HENT: Positive for congestion (mild), hearing loss, postnasal drip, tinnitus (mild) and voice change. Negative for ear pain, sinus pain, sinus pressure, sore throat and trouble swallowing.   Eyes: Negative for visual disturbance.  Respiratory: Positive for cough (chronic, worse at night recently). Negative for shortness of breath and wheezing.   Cardiovascular: Negative for chest pain, palpitations and leg swelling.  Gastrointestinal: Positive for constipation (occasional). Negative for abdominal pain, blood in stool, diarrhea and nausea.       No gerd  Genitourinary: Negative for dysuria and hematuria.  Skin: Negative for color change and rash.  Neurological: Negative for light-headedness and headaches.  Psychiatric/Behavioral: Negative for dysphoric mood. The patient is not nervous/anxious.        Objective:   Vitals:   09/19/16 1307  BP: 136/66  Pulse: 66   Filed Weights   09/19/16 1307  Weight: 190 lb (86.2 kg)   Body mass index is 27.26 kg/m.  Wt Readings from Last 3 Encounters:  09/19/16 190 lb (86.2 kg)  03/27/16 189 lb 8 oz (86 kg)  09/20/15 192 lb (87.1 kg)     Physical Exam Constitutional: He appears well-developed and well-nourished. No distress.  HENT:  Head: Normocephalic and atraumatic.  Right Ear: External ear normal.  Left Ear: External ear normal.  Mouth/Throat: Oropharynx is clear and moist.  Normal ear canals and TM b/l  Eyes: Conjunctivae and EOM are normal.  Neck: Neck supple. No tracheal deviation present. No thyromegaly present.  No carotid bruit  Cardiovascular: Normal rate, regular rhythm,  normal heart sounds and intact distal pulses.   2/6 systolic murmur heard. Pulmonary/Chest: Effort normal and breath sounds normal. No respiratory distress. He has  no wheezes. He has no rales.  Abdominal: Soft. Bowel sounds are normal. He exhibits no distension. There is no tenderness.  Genitourinary: deferred  Musculoskeletal: He exhibits no edema.  Lymphadenopathy:   He has no cervical adenopathy.  Skin: Skin is warm and dry. He is not diaphoretic.  Psychiatric: He has a normal mood and affect. His behavior is normal.         Assessment & Plan:   Wellness Exam: Immunizations  Up to date  - discussed shingrix Colonoscopy  No longer needed due to his age Eye exam  No, scheduled Hearing loss -no - deferred hearing evaluation Memory concerns/difficulties -- yes, stable Independent of ADLs  Fully independent Stressed the importance of regular exercise   Patient received copy of preventative screening tests/immunizations recommended for the next 5-10 years.   Physical exam: Screening blood work   ordered Immunizations   Up to date  - discussed shingrix Colonoscopy - no longer needed due to age Eye exams - no up to date, scheduled EKG   Robinson 2016 Exercise  - none Weight - adequate for his age Skin    no concerns Substance abuse  none  See Problem List for Assessment and Plan of chronic medical problems.   FU one year

## 2016-09-19 NOTE — Assessment & Plan Note (Signed)
Check lipid panel  Continue daily statin Regular exercise and healthy diet encouraged  

## 2016-09-20 ENCOUNTER — Encounter: Payer: Medicare Other | Admitting: Internal Medicine

## 2016-10-24 DIAGNOSIS — M0579 Rheumatoid arthritis with rheumatoid factor of multiple sites without organ or systems involvement: Secondary | ICD-10-CM | POA: Diagnosis not present

## 2016-10-24 DIAGNOSIS — M150 Primary generalized (osteo)arthritis: Secondary | ICD-10-CM | POA: Diagnosis not present

## 2016-10-24 DIAGNOSIS — M5136 Other intervertebral disc degeneration, lumbar region: Secondary | ICD-10-CM | POA: Diagnosis not present

## 2016-11-30 DIAGNOSIS — Z79899 Other long term (current) drug therapy: Secondary | ICD-10-CM | POA: Diagnosis not present

## 2016-11-30 DIAGNOSIS — H524 Presbyopia: Secondary | ICD-10-CM | POA: Diagnosis not present

## 2016-11-30 DIAGNOSIS — M069 Rheumatoid arthritis, unspecified: Secondary | ICD-10-CM | POA: Diagnosis not present

## 2016-11-30 DIAGNOSIS — Z961 Presence of intraocular lens: Secondary | ICD-10-CM | POA: Diagnosis not present

## 2016-12-16 ENCOUNTER — Other Ambulatory Visit: Payer: Self-pay | Admitting: Internal Medicine

## 2017-01-23 DIAGNOSIS — M150 Primary generalized (osteo)arthritis: Secondary | ICD-10-CM | POA: Diagnosis not present

## 2017-01-23 DIAGNOSIS — M0579 Rheumatoid arthritis with rheumatoid factor of multiple sites without organ or systems involvement: Secondary | ICD-10-CM | POA: Diagnosis not present

## 2017-01-23 DIAGNOSIS — M5136 Other intervertebral disc degeneration, lumbar region: Secondary | ICD-10-CM | POA: Diagnosis not present

## 2017-01-23 DIAGNOSIS — M2550 Pain in unspecified joint: Secondary | ICD-10-CM | POA: Diagnosis not present

## 2017-02-14 ENCOUNTER — Other Ambulatory Visit: Payer: Self-pay | Admitting: Internal Medicine

## 2017-04-26 DIAGNOSIS — M150 Primary generalized (osteo)arthritis: Secondary | ICD-10-CM | POA: Diagnosis not present

## 2017-04-26 DIAGNOSIS — M0579 Rheumatoid arthritis with rheumatoid factor of multiple sites without organ or systems involvement: Secondary | ICD-10-CM | POA: Diagnosis not present

## 2017-04-26 DIAGNOSIS — M5136 Other intervertebral disc degeneration, lumbar region: Secondary | ICD-10-CM | POA: Diagnosis not present

## 2017-05-04 DIAGNOSIS — M5136 Other intervertebral disc degeneration, lumbar region: Secondary | ICD-10-CM | POA: Diagnosis not present

## 2017-05-04 DIAGNOSIS — M961 Postlaminectomy syndrome, not elsewhere classified: Secondary | ICD-10-CM | POA: Diagnosis not present

## 2017-05-24 DIAGNOSIS — M5136 Other intervertebral disc degeneration, lumbar region: Secondary | ICD-10-CM | POA: Diagnosis not present

## 2017-05-24 DIAGNOSIS — M5416 Radiculopathy, lumbar region: Secondary | ICD-10-CM | POA: Diagnosis not present

## 2017-06-06 ENCOUNTER — Ambulatory Visit: Payer: Medicare Other | Admitting: Internal Medicine

## 2017-07-30 DIAGNOSIS — M0579 Rheumatoid arthritis with rheumatoid factor of multiple sites without organ or systems involvement: Secondary | ICD-10-CM | POA: Diagnosis not present

## 2017-07-30 DIAGNOSIS — M5136 Other intervertebral disc degeneration, lumbar region: Secondary | ICD-10-CM | POA: Diagnosis not present

## 2017-07-30 DIAGNOSIS — M150 Primary generalized (osteo)arthritis: Secondary | ICD-10-CM | POA: Diagnosis not present

## 2017-08-22 DIAGNOSIS — M5126 Other intervertebral disc displacement, lumbar region: Secondary | ICD-10-CM | POA: Diagnosis not present

## 2017-08-22 DIAGNOSIS — M5137 Other intervertebral disc degeneration, lumbosacral region: Secondary | ICD-10-CM | POA: Diagnosis not present

## 2017-08-22 DIAGNOSIS — M5127 Other intervertebral disc displacement, lumbosacral region: Secondary | ICD-10-CM | POA: Diagnosis not present

## 2017-08-22 DIAGNOSIS — M5136 Other intervertebral disc degeneration, lumbar region: Secondary | ICD-10-CM | POA: Diagnosis not present

## 2017-09-19 NOTE — Progress Notes (Addendum)
Subjective:   Ethan Robinson is a 83 y.o. male who presents for Medicare Annual/Subsequent preventive examination.  Review of Systems:  No ROS.  Medicare Wellness Visit. Additional risk factors are reflected in the social history.  Cardiac Risk Factors include: advanced age (>33men, >48 women);dyslipidemia;hypertension Sleep patterns: has difficulty falling asleep, gets up 1 times nightly to void and sleeps 6-7 hours nightly.    Home Safety/Smoke Alarms: Feels safe in home. Smoke alarms in place.  Living environment; residence and Firearm Safety: 1-story house/ trailer, no firearms. Lives with wife, no needs for DME, good support system Seat Belt Safety/Bike Helmet: Wears seat belt.     Objective:    Vitals: BP 130/80   Pulse 64   Resp 18   Ht 5\' 10"  (1.778 m)   Wt 191 lb (86.6 kg)   SpO2 96%   BMI 27.41 kg/m   Body mass index is 27.41 kg/m.  Advanced Directives 09/20/2017 08/03/2014 07/27/2014 04/10/2014 04/10/2014 04/07/2014 03/25/2014  Does Patient Have a Medical Advance Directive? No Yes No Yes - Yes No  Type of Advance Directive - - - Living will - Living will -  Does patient want to make changes to medical advance directive? Yes (ED - Information included in AVS) No - Patient declined - No - Patient declined - No - Patient declined -  Copy of Healthcare Power of Attorney in Chart? - - - No - copy requested (No Data) No - copy requested -  Would patient like information on creating a medical advance directive? - - No - patient declined information - - - -  Pre-existing out of facility DNR order (yellow form or pink MOST form) - - - - - - -    Tobacco Social History   Tobacco Use  Smoking Status Former Smoker  . Packs/day: 2.00  . Types: Cigarettes  . Last attempt to quit: 05/29/1972  . Years since quitting: 45.3  Smokeless Tobacco Never Used  Tobacco Comment   smoked 1957-1974, up to 2 ppd  occ alcohol     Counseling given: Not Answered Comment: smoked  1957-1974, up to 2 ppd  occ alcohol  Past Medical History:  Diagnosis Date  . Anemia 2010   H/H  12.9/38  . Asthma    hx of   . CAD (coronary artery disease)    Dr 10-18-1997  . Depression   . Diverticulosis 2004   [facility]; due 2014  . GERD (gastroesophageal reflux disease)    NO MEDS  . Movement disorder    essential tremors  . Other and unspecified hyperlipidemia   . RA (rheumatoid arthritis) (HCC)    Dr 2015  AND OA BOTH KNEES AND HANDS  . Unspecified essential hypertension    Past Surgical History:  Procedure Laterality Date  . ANGIOPLASTY  1997   3 stents  . BACK SURGERY  1995   for ruptured disc LS spine  . CARDIAC CATHETERIZATION     coronary stents x3- placed 20 yrs ago.  Dierdre Forth CARPAL TUNNEL RELEASE     bilateral  . CATARACT EXTRACTION, BILATERAL  2011  . CHOLECYSTECTOMY N/A 02/13/2013   Procedure: LAPAROSCOPIC CHOLECYSTECTOMY WITH INTRAOPERATIVE CHOLANGIOGRAM;  Surgeon: 02/15/2013. Cornett, MD;  Location: [facility] OR;  Service: General;  Laterality: N/A;  . COLONOSCOPY  2004   Diverticulosis  . KNEE SURGERY     x 2  . post op transfusion  9/14   for anemia  . PROSTATE SURGERY  BPH  . SHOULDER SURGERY  2005  . TONSILLECTOMY    . TOTAL KNEE ARTHROPLASTY  08/07/2011   Procedure: TOTAL KNEE ARTHROPLASTY;  Surgeon: Loanne Drilling, MD;  Location: [facility];  Service: Orthopedics;  Laterality: Right;  . TOTAL KNEE ARTHROPLASTY Left 08/03/2014   Procedure: LEFT TOTAL KNEE ARTHROPLASTY;  Surgeon: Ollen Gross, MD;  Location: [facility];  Service: Orthopedics;  Laterality: Left;  . TRANSURETHRAL RESECTION OF PROSTATE N/A 04/10/2014   Procedure: TRANSURETHRAL RESECTION OF THE PROSTATE WITH GYRUS INSTRUMENTS;  Surgeon: Garnett Farm, MD;  Location: [facility];  Service: Urology;  Laterality: N/A;  . TRIGGER FINGER RELEASE     multiple   Family History  Problem Relation Age of Onset  . Cancer Father        esophagus  . Stroke Mother        in mid 71s  . Arthritis Mother   .  Diabetes Brother   . Heart attack Paternal Grandmother        ? in 45s  . Diabetes Maternal Aunt   . Prostate cancer Maternal Uncle   . Cancer Maternal Uncle   . Prostate cancer Cousin   . Cancer Cousin   . Liver cancer Unknown        Paternal family history  . Leukemia Unknown        Paternal family history  . Cancer Cousin    Social History   Socioeconomic History  . Marital status: Married    Spouse name: Steward Drone  . Number of children: 1  . Years of education: 77  . Highest education level: Not on file  Occupational History    Comment: retired  Engineer, production  . Financial resource strain: Not hard at all  . Food insecurity:    Worry: Never true    Inability: Never true  . Transportation needs:    Medical: No    Non-medical: No  Tobacco Use  . Smoking status: Former Smoker    Packs/day: 2.00    Types: Cigarettes    Last attempt to quit: 05/29/1972    Years since quitting: 45.3  . Smokeless tobacco: Never Used  . Tobacco comment: smoked 1957-1974, up to 2 ppd  occ alcohol  Substance and Sexual Activity  . Alcohol use: Yes    Alcohol/week: 0.6 oz    Types: 1 Cans of beer per week    Comment: beer rarely  . Drug use: No  . Sexual activity: Never  Lifestyle  . Physical activity:    Days per week: 0 days    Minutes per session: 0 min  . Stress: Very much  Relationships  . Social connections:    Talks on phone: More than three times a week    Gets together: More than three times a week    Attends religious service: Not on file    Active member of club or organization: Not on file    Attends meetings of clubs or organizations: Not on file    Relationship status: Married  Other Topics Concern  . Not on file  Social History Narrative   Patient lives with his wife Steward Drone).   Patient has 1 child.   Patient has a high school education.   Patient is right-handed.   Patient is retired.      Exercise: no    Outpatient Encounter Medications as of 09/20/2017    Medication Sig  . acetaminophen (TYLENOL) 500 MG tablet Take 1,000 mg by mouth every  6 (six) hours as needed for moderate pain or headache. Reported on 09/20/2015  . aspirin 325 MG tablet Take 325 mg by mouth daily.  . DULoxetine (CYMBALTA) 60 MG capsule   . hydroxychloroquine (PLAQUENIL) 200 MG tablet   . levothyroxine (SYNTHROID, LEVOTHROID) 88 MCG tablet TAKE 1 TABLET BY MOUTH ONCE DAILY  . methotrexate 50 MG/2ML injection   . metoprolol succinate (TOPROL-XL) 25 MG 24 hr tablet TAKE 1 TABLET BY MOUTH ONCE DAILY  . polyethylene glycol (MIRALAX / GLYCOLAX) packet Take 17 g by mouth daily as needed for mild constipation.  . predniSONE (DELTASONE) 5 MG tablet TAKE 1 TABLET BY MOUTH IN THE MORNING  . primidone (MYSOLINE) 50 MG tablet Take 2 tablets (100 mg total) by mouth 3 (three) times daily.  . rosuvastatin (CRESTOR) 20 MG tablet TAKE 1 TABLET BY MOUTH AT BEDTIME  . sulfaSALAzine (AZULFIDINE) 500 MG tablet Take 1,000 mg by mouth 2 (two) times daily.  . [DISCONTINUED] Calcium Carbonate (CALCIUM 600 PO) Take 600 mg by mouth daily.    No facility-administered encounter medications on file as of 09/20/2017.     Activities of Daily Living In your present state of health, do you have any difficulty performing the following activities: 09/20/2017  Hearing? N  Vision? N  Difficulty concentrating or making decisions? N  Walking or climbing stairs? N  Dressing or bathing? N  Doing errands, shopping? N  Preparing Food and eating ? N  Using the Toilet? N  In the past six months, have you accidently leaked urine? N  Do you have problems with loss of bowel control? N  Managing your Medications? N  Managing your Finances? N  Housekeeping or managing your Housekeeping? Y  Some recent data might be hidden    Patient Care Team: Pincus Sanes, MD as PCP - General (Internal Medicine) Donnetta Hail, MD as Consulting Physician (Rheumatology) Levert Feinstein, MD as Consulting Physician (Neurology)    Assessment:   This is a routine wellness examination for [place]. Physical assessment deferred to PCP.   Exercise Activities and Dietary recommendations Current Exercise Habits: The patient does not participate in regular exercise at present, Exercise limited by: orthopedic condition(s)  Diet (meal preparation, eat out, water intake, caffeinated beverages, dairy products, fruits and vegetables): in general, a "healthy" diet     Reviewed heart healthy diet, encouraged patient to increase daily water intake.  Goals    . Patient Stated     Maintain current health status and stay as independent as possible       Fall Risk Fall Risk  09/20/2017 09/19/2016 02/24/2014 06/11/2013  Falls in the past year? Yes Yes (No Data) Yes  Comment - - stepped over hose and fell -  Number falls in past yr: 2 or more 2 or more - 2 or more  Injury with Fall? No No - -  Risk for fall due to : Impaired balance/gait - Impaired balance/gait -  Risk for fall due to: Comment - - dizziness at times with bending and standing up quickly -  Follow up Falls prevention discussed;Education provided - - -    Depression Screen PHQ 2/9 Scores 09/20/2017 09/19/2016 02/24/2014 06/11/2013  PHQ - 2 Score 2 0 2 0  PHQ- 9 Score 5 - 3 -    Cognitive Function MMSE - Mini Mental State Exam 09/20/2017  Orientation to time 5  Orientation to Place 5  Registration 3  Attention/ Calculation 4  Recall 2  Language- name 2  objects 2  Language- repeat 1  Language- follow 3 step command 3  Language- read & follow direction 1  Write a sentence 1  Copy design 1  Total score 28        Immunization History  Administered Date(s) Administered  . Influenza Whole 03/22/2005  . Influenza,inj,Quad PF,6+ Mos 02/24/2014  . Influenza-Unspecified 03/12/2013, 03/08/2015, 02/11/2016, 03/14/2017  . Pneumococcal Conjugate-13 03/08/2015, 02/11/2016  . Pneumococcal Polysaccharide-23 05/29/2008  . Td 12/19/2007  . Zoster 06/11/2009  . Zoster  Recombinat (Shingrix) 02/16/2017, 06/13/2017   Screening Tests Health Maintenance  Topic Date Due  . TETANUS/TDAP  12/18/2017  . INFLUENZA VACCINE  12/27/2017  . PNA vac Low Risk Adult  Completed        Plan:    Continue doing brain stimulating activities (puzzles, reading, adult coloring books, staying active) to keep memory sharp.   Continue to eat heart healthy diet (full of fruits, vegetables, whole grains, lean protein, water--limit salt, fat, and sugar intake) and increase physical activity as tolerated.  I have personally reviewed and noted the following in the patient's chart:   . Medical and social history . Use of alcohol, tobacco or illicit drugs  . Current medications and supplements . Functional ability and status . Nutritional status . Physical activity . Advanced directives . List of other physicians . Vitals . Screenings to include cognitive, depression, and falls . Referrals and appointments  In addition, I have reviewed and discussed with patient certain preventive protocols, quality metrics, and best practice recommendations. A written personalized care plan for preventive services as well as general preventive health recommendations were provided to patient.     Wanda Plump, RN  09/20/2017 Medical screening examination/treatment/procedure(s) were performed by non-physician practitioner and as supervising physician I was immediately available for consultation/collaboration. I agree with above. Oliver Barre, MD

## 2017-09-20 ENCOUNTER — Ambulatory Visit (INDEPENDENT_AMBULATORY_CARE_PROVIDER_SITE_OTHER): Payer: Medicare Other | Admitting: *Deleted

## 2017-09-20 VITALS — BP 130/80 | HR 64 | Resp 18 | Ht 70.0 in | Wt 191.0 lb

## 2017-09-20 DIAGNOSIS — Z0000 Encounter for general adult medical examination without abnormal findings: Secondary | ICD-10-CM | POA: Diagnosis not present

## 2017-09-20 NOTE — Patient Instructions (Addendum)
Continue doing brain stimulating activities (puzzles, reading, adult coloring books, staying active) to keep memory sharp.   Continue to eat heart healthy diet (full of fruits, vegetables, whole grains, lean protein, water--limit salt, fat, and sugar intake) and increase physical activity as tolerated.  Ethan Robinson , Thank you for taking time to come for your Medicare Wellness Visit. I appreciate your ongoing commitment to your health goals. Please review the following plan we discussed and let me know if I can assist you in the future.   These are the goals we discussed: Goals    . Patient Stated     Maintain current health status and stay as independent as possible       This is a list of the screening recommended for you and due dates:  Health Maintenance  Topic Date Due  . Tetanus Vaccine  12/18/2017  . Flu Shot  12/27/2017  . Pneumonia vaccines  Completed    Fall Prevention in the Home Falls can cause injuries. They can happen to people of all ages. There are many things you can do to make your home safe and to help prevent falls. What can I do on the outside of my home?  Regularly fix the edges of walkways and driveways and fix any cracks.  Remove anything that might make you trip as you walk through a door, such as a raised step or threshold.  Trim any bushes or trees on the path to your home.  Use bright outdoor lighting.  Clear any walking paths of anything that might make someone trip, such as rocks or tools.  Regularly check to see if handrails are loose or broken. Make sure that both sides of any steps have handrails.  Any raised decks and porches should have guardrails on the edges.  Have any leaves, snow, or ice cleared regularly.  Use sand or salt on walking paths during winter.  Clean up any spills in your garage right away. This includes oil or grease spills. What can I do in the bathroom?  Use night lights.  Install grab bars by the toilet and in  the tub and shower. Do not use towel bars as grab bars.  Use non-skid mats or decals in the tub or shower.  If you need to sit down in the shower, use a plastic, non-slip stool.  Keep the floor dry. Clean up any water that spills on the floor as soon as it happens.  Remove soap buildup in the tub or shower regularly.  Attach bath mats securely with double-sided non-slip rug tape.  Do not have throw rugs and other things on the floor that can make you trip. What can I do in the bedroom?  Use night lights.  Make sure that you have a light by your bed that is easy to reach.  Do not use any sheets or blankets that are too big for your bed. They should not hang down onto the floor.  Have a firm chair that has side arms. You can use this for support while you get dressed.  Do not have throw rugs and other things on the floor that can make you trip. What can I do in the kitchen?  Clean up any spills right away.  Avoid walking on wet floors.  Keep items that you use a lot in easy-to-reach places.  If you need to reach something above you, use a strong step stool that has a grab bar.  Keep electrical cords out  of the way.  Do not use floor polish or wax that makes floors slippery. If you must use wax, use non-skid floor wax.  Do not have throw rugs and other things on the floor that can make you trip. What can I do with my stairs?  Do not leave any items on the stairs.  Make sure that there are handrails on both sides of the stairs and use them. Fix handrails that are broken or loose. Make sure that handrails are as long as the stairways.  Check any carpeting to make sure that it is firmly attached to the stairs. Fix any carpet that is loose or worn.  Avoid having throw rugs at the top or bottom of the stairs. If you do have throw rugs, attach them to the floor with carpet tape.  Make sure that you have a light switch at the top of the stairs and the bottom of the stairs. If  you do not have them, ask someone to add them for you. What else can I do to help prevent falls?  Wear shoes that: ? Do not have high heels. ? Have rubber bottoms. ? Are comfortable and fit you well. ? Are closed at the toe. Do not wear sandals.  If you use a stepladder: ? Make sure that it is fully opened. Do not climb a closed stepladder. ? Make sure that both sides of the stepladder are locked into place. ? Ask someone to hold it for you, if possible.  Clearly mark and make sure that you can see: ? Any grab bars or handrails. ? First and last steps. ? Where the edge of each step is.  Use tools that help you move around (mobility aids) if they are needed. These include: ? Canes. ? Walkers. ? Scooters. ? Crutches.  Turn on the lights when you go into a dark area. Replace any light bulbs as soon as they burn out.  Set up your furniture so you have a clear path. Avoid moving your furniture around.  If any of your floors are uneven, fix them.  If there are any pets around you, be aware of where they are.  Review your medicines with your doctor. Some medicines can make you feel dizzy. This can increase your chance of falling. Ask your doctor what other things that you can do to help prevent falls. This information is not intended to replace advice given to you by your health care provider. Make sure you discuss any questions you have with your health care provider. Document Released: 03/11/2009 Document Revised: 10/21/2015 Document Reviewed: 06/19/2014 Elsevier Interactive Patient Education  2018 [employer].   Health Maintenance, Male A healthy lifestyle and preventive care is important for your health and wellness. Ask your health care provider about what schedule of regular examinations is right for you. What should I know about weight and diet? Eat a Healthy Diet  Eat plenty of vegetables, fruits, whole grains, low-fat dairy products, and lean protein.  Do not eat a  lot of foods high in solid fats, added sugars, or salt.  Maintain a Healthy Weight Regular exercise can help you achieve or maintain a healthy weight. You should:  Do at least 150 minutes of exercise each week. The exercise should increase your heart rate and make you sweat (moderate-intensity exercise).  Do strength-training exercises at least twice a week.  Watch Your Levels of Cholesterol and Blood Lipids  Have your blood tested for lipids and cholesterol every 5  years starting at 81 years of age. If you are at high risk for heart disease, you should start having your blood tested when you are 81 years old. You may need to have your cholesterol levels checked more often if: ? Your lipid or cholesterol levels are high. ? You are older than 81 years of age. ? You are at high risk for heart disease.  What should I know about cancer screening? Many types of cancers can be detected early and may often be prevented. Lung Cancer  You should be screened every year for lung cancer if: ? You are a current smoker who has smoked for at least 30 years. ? You are a former smoker who has quit within the past 15 years.  Talk to your health care provider about your screening options, when you should start screening, and how often you should be screened.  Colorectal Cancer  Routine colorectal cancer screening usually begins at 81 years of age and should be repeated every 5-10 years until you are 81 years old. You may need to be screened more often if early forms of precancerous polyps or small growths are found. Your health care provider may recommend screening at an earlier age if you have risk factors for colon cancer.  Your health care provider may recommend using home test kits to check for hidden blood in the stool.  A small camera at the end of a tube can be used to examine your colon (sigmoidoscopy or colonoscopy). This checks for the earliest forms of colorectal cancer.  Prostate and  Testicular Cancer  Depending on your age and overall health, your health care provider may do certain tests to screen for prostate and testicular cancer.  Talk to your health care provider about any symptoms or concerns you have about testicular or prostate cancer.  Skin Cancer  Check your skin from head to toe regularly.  Tell your health care provider about any new moles or changes in moles, especially if: ? There is a change in a mole's size, shape, or color. ? You have a mole that is larger than a pencil eraser.  Always use sunscreen. Apply sunscreen liberally and repeat throughout the day.  Protect yourself by wearing long sleeves, pants, a wide-brimmed hat, and sunglasses when outside.  What should I know about heart disease, diabetes, and high blood pressure?  If you are 73-44 years of age, have your blood pressure checked every 3-5 years. If you are 91 years of age or older, have your blood pressure checked every year. You should have your blood pressure measured twice-once when you are at a hospital or clinic, and once when you are not at a hospital or clinic. Record the average of the two measurements. To check your blood pressure when you are not at a hospital or clinic, you can use: ? An automated blood pressure machine at a pharmacy. ? A home blood pressure monitor.  Talk to your health care provider about your target blood pressure.  If you are between 39-28 years old, ask your health care provider if you should take aspirin to prevent heart disease.  Have regular diabetes screenings by checking your fasting blood sugar level. ? If you are at a normal weight and have a low risk for diabetes, have this test once every three years after the age of 62. ? If you are overweight and have a high risk for diabetes, consider being tested at a younger age or more often.  A one-time screening for abdominal aortic aneurysm (AAA) by ultrasound is recommended for men aged 65-75 years  who are current or former smokers. What should I know about preventing infection? Hepatitis B If you have a higher risk for hepatitis B, you should be screened for this virus. Talk with your health care provider to find out if you are at risk for hepatitis B infection. Hepatitis C Blood testing is recommended for:  Everyone born from [birth year] through 1963.  Anyone with known risk factors for hepatitis C.  Sexually Transmitted Diseases (STDs)  You should be screened each year for STDs including gonorrhea and chlamydia if: ? You are sexually active and are younger than 81 years of age. ? You are older than 81 years of age and your health care provider tells you that you are at risk for this type of infection. ? Your sexual activity has changed since you were last screened and you are at an increased risk for chlamydia or gonorrhea. Ask your health care provider if you are at risk.  Talk with your health care provider about whether you are at high risk of being infected with HIV. Your health care provider may recommend a prescription medicine to help prevent HIV infection.  What else can I do?  Schedule regular health, dental, and eye exams.  Stay current with your vaccines (immunizations).  Do not use any tobacco products, such as cigarettes, chewing tobacco, and e-cigarettes. If you need help quitting, ask your health care provider.  Limit alcohol intake to no more than 2 drinks per day. One drink equals 12 ounces of beer, 5 ounces of Hasset Chaviano, or 1 ounces of hard liquor.  Do not use street drugs.  Do not share needles.  Ask your health care provider for help if you need support or information about quitting drugs.  Tell your health care provider if you often feel depressed.  Tell your health care provider if you have ever been abused or do not feel safe at home. This information is not intended to replace advice given to you by your health care provider. Make sure you discuss any  questions you have with your health care provider. Document Released: 11/11/2007 Document Revised: 01/12/2016 Document Reviewed: 02/16/2015 Elsevier Interactive Patient Education  [employer].

## 2017-10-16 DIAGNOSIS — R030 Elevated blood-pressure reading, without diagnosis of hypertension: Secondary | ICD-10-CM | POA: Diagnosis not present

## 2017-10-16 DIAGNOSIS — M25552 Pain in left hip: Secondary | ICD-10-CM | POA: Diagnosis not present

## 2017-10-18 DIAGNOSIS — M25552 Pain in left hip: Secondary | ICD-10-CM | POA: Diagnosis not present

## 2017-10-19 ENCOUNTER — Other Ambulatory Visit: Payer: Self-pay | Admitting: Internal Medicine

## 2017-10-23 ENCOUNTER — Encounter: Payer: Self-pay | Admitting: Internal Medicine

## 2017-10-23 DIAGNOSIS — M545 Low back pain: Secondary | ICD-10-CM | POA: Diagnosis not present

## 2017-10-23 DIAGNOSIS — G8929 Other chronic pain: Secondary | ICD-10-CM | POA: Diagnosis not present

## 2017-10-23 NOTE — Progress Notes (Addendum)
Subjective:    Patient ID: Ethan Robinson, male    DOB: 01-11-1935, 83 y.o.   MRN: [id number]  HPI He is here for a physical exam.   He denies any changes in his history.  He has no concerns.    Medications and allergies reviewed with patient and updated if appropriate.  Patient Active Problem List   Diagnosis Date Noted  . Low back pain 03/27/2016  . Abnormality of gait 03/27/2016  . Hypothyroidism 09/23/2015  . Mild cognitive impairment 09/20/2015  . BPH (benign prostatic hypertrophy) with urinary obstruction 04/10/2014  . Gallstones 01/31/2013  . Back pain, thoracic 01/15/2013  . Right lumbar radiculopathy 01/15/2013  . OA (osteoarthritis) of knee 08/07/2011  . VENTRAL HERNIA 01/11/2010  . Rheumatoid arthritis (HCC) 01/11/2010  . Hypertensive heart disease without CHF 05/04/2009  . PEPTIC ULCER DISEASE 04/29/2009  . COUGH, CHRONIC 04/15/2008  . DYSPHAGIA PHARYNGEAL PHASE 04/15/2008  . Essential tremor 12/19/2007  . Fasting hyperglycemia 12/19/2007  . Hyperlipidemia 07/22/2007  . Macrocytic anemia 07/22/2007  . Coronary atherosclerosis 07/22/2007  . ALLERGIC RHINITIS 07/22/2007  . ASTHMA 07/22/2007  . GERD 07/22/2007  . DEGENERATIVE JOINT DISEASE 07/22/2007    Current Outpatient Medications on File Prior to Visit  Medication Sig Dispense Refill  . acetaminophen (TYLENOL) 500 MG tablet Take 1,000 mg by mouth every 6 (six) hours as needed for moderate pain or headache. Reported on 09/20/2015    . aspirin 325 MG tablet Take 325 mg by mouth daily.    . DULoxetine (CYMBALTA) 60 MG capsule     . folic acid (FOLVITE) 1 MG tablet   3  . hydroxychloroquine (PLAQUENIL) 200 MG tablet     . levothyroxine (SYNTHROID, LEVOTHROID) 88 MCG tablet TAKE 1 TABLET BY MOUTH ONCE DAILY 90 tablet 0  . methotrexate 50 MG/2ML injection     . metoprolol succinate (TOPROL-XL) 25 MG 24 hr tablet TAKE 1 TABLET BY MOUTH ONCE DAILY 90 tablet 0  . polyethylene glycol (MIRALAX / GLYCOLAX)  packet Take 17 g by mouth daily as needed for mild constipation. 14 each 0  . predniSONE (DELTASONE) 5 MG tablet TAKE 1 TABLET BY MOUTH IN THE MORNING 90 tablet 1  . primidone (MYSOLINE) 50 MG tablet Take 2 tablets (100 mg total) by mouth 3 (three) times daily. 540 tablet 4  . rosuvastatin (CRESTOR) 20 MG tablet TAKE 1 TABLET BY MOUTH AT BEDTIME 90 tablet 1  . sulfaSALAzine (AZULFIDINE) 500 MG tablet Take 1,000 mg by mouth 2 (two) times daily.    . [DISCONTINUED] Calcium Carbonate (CALCIUM 600 PO) Take 600 mg by mouth daily.      No current facility-administered medications on file prior to visit.     Past Medical History:  Diagnosis Date  . Anemia 2010   H/H  12.9/38  . Asthma    hx of   . CAD (coronary artery disease)    Dr Gala Romney  . Depression   . Diverticulosis 2004   [facility]; due 2014  . GERD (gastroesophageal reflux disease)    NO MEDS  . Movement disorder    essential tremors  . Other and unspecified hyperlipidemia   . RA (rheumatoid arthritis) (HCC)    Dr Dierdre Forth  AND OA BOTH KNEES AND HANDS  . Unspecified essential hypertension     Past Surgical History:  Procedure Laterality Date  . ANGIOPLASTY  1997   3 stents  . BACK SURGERY  1995   for ruptured disc LS  spine  . CARDIAC CATHETERIZATION     coronary stents x3- placed 20 yrs ago.  Marland Kitchen CARPAL TUNNEL RELEASE     bilateral  . CATARACT EXTRACTION, BILATERAL  2011  . CHOLECYSTECTOMY N/A 02/13/2013   Procedure: LAPAROSCOPIC CHOLECYSTECTOMY WITH INTRAOPERATIVE CHOLANGIOGRAM;  Surgeon: Clovis Pu. Cornett, MD;  Location: [facility] OR;  Service: General;  Laterality: N/A;  . COLONOSCOPY  2004   Diverticulosis  . KNEE SURGERY     x 2  . post op transfusion  9/14   for anemia  . PROSTATE SURGERY     BPH  . SHOULDER SURGERY  2005  . TONSILLECTOMY    . TOTAL KNEE ARTHROPLASTY  08/07/2011   Procedure: TOTAL KNEE ARTHROPLASTY;  Surgeon: Loanne Drilling, MD;  Location: [facility];  Service: Orthopedics;  Laterality: Right;  .  TOTAL KNEE ARTHROPLASTY Left 08/03/2014   Procedure: LEFT TOTAL KNEE ARTHROPLASTY;  Surgeon: Ollen Gross, MD;  Location: [facility];  Service: Orthopedics;  Laterality: Left;  . TRANSURETHRAL RESECTION OF PROSTATE N/A 04/10/2014   Procedure: TRANSURETHRAL RESECTION OF THE PROSTATE WITH GYRUS INSTRUMENTS;  Surgeon: Garnett Farm, MD;  Location: [facility];  Service: Urology;  Laterality: N/A;  . TRIGGER FINGER RELEASE     multiple    Social History   Socioeconomic History  . Marital status: Married    Spouse name: Steward Drone  . Number of children: 1  . Years of education: 76  . Highest education level: Not on file  Occupational History    Comment: retired  Engineer, production  . Financial resource strain: Not hard at all  . Food insecurity:    Worry: Never true    Inability: Never true  . Transportation needs:    Medical: No    Non-medical: No  Tobacco Use  . Smoking status: Former Smoker    Packs/day: 2.00    Types: Cigarettes    Last attempt to quit: 05/29/1972    Years since quitting: 45.4  . Smokeless tobacco: Never Used  . Tobacco comment: smoked 1957-1974, up to 2 ppd  occ alcohol  Substance and Sexual Activity  . Alcohol use: Yes    Alcohol/week: 0.6 oz    Types: 1 Cans of beer per week    Comment: beer rarely  . Drug use: No  . Sexual activity: Never  Lifestyle  . Physical activity:    Days per week: 0 days    Minutes per session: 0 min  . Stress: Very much  Relationships  . Social connections:    Talks on phone: More than three times a week    Gets together: More than three times a week    Attends religious service: Not on file    Active member of club or organization: Not on file    Attends meetings of clubs or organizations: Not on file    Relationship status: Married  Other Topics Concern  . Not on file  Social History Narrative   Patient lives with his wife Steward Drone).   Patient has 1 child.   Patient has a high school education.   Patient is right-handed.   Patient  is retired.      Exercise: no    Family History  Problem Relation Age of Onset  . Cancer Father        esophagus  . Stroke Mother        in mid 43s  . Arthritis Mother   . Diabetes Brother   . Heart attack Paternal  Grandmother        ? in 12s  . Diabetes Maternal Aunt   . Prostate cancer Maternal Uncle   . Cancer Maternal Uncle   . Prostate cancer Cousin   . Cancer Cousin   . Liver cancer Unknown        Paternal family history  . Leukemia Unknown        Paternal family history  . Cancer Cousin     Review of Systems  Constitutional: Negative for chills and fever.  Eyes: Negative for visual disturbance.  Respiratory: Positive for cough (chronic, dry). Negative for shortness of breath and wheezing.   Cardiovascular: Positive for leg swelling (mild, occ). Negative for chest pain and palpitations.  Gastrointestinal: Negative for abdominal pain, blood in stool, constipation and diarrhea.       Intermittent gerd - take prilosec prn  Genitourinary: Negative for dysuria and hematuria.  Musculoskeletal: Positive for back pain. Negative for arthralgias.  Skin: Negative for color change and rash.  Neurological: Positive for tremors (essential), light-headedness and headaches (rare).  Psychiatric/Behavioral: Negative for dysphoric mood. The patient is not nervous/anxious.        Objective:   Vitals:   10/24/17 1408  BP: (!) 124/50  Pulse: 75  Resp: 16  Temp: 98.6 F (37 C)  SpO2: 96%   Filed Weights   10/24/17 1408  Weight: 189 lb (85.7 kg)   Body mass index is 27.12 kg/m.  Wt Readings from Last 3 Encounters:  10/24/17 189 lb (85.7 kg)  09/20/17 191 lb (86.6 kg)  09/19/16 190 lb (86.2 kg)     Physical Exam Constitutional: He appears well-developed and well-nourished. No distress.  HENT:  Head: Normocephalic and atraumatic.  Right Ear: External ear normal.  Left Ear: External ear normal.  Mouth/Throat: Oropharynx is clear and moist.  Normal ear canals and TM  b/l  Eyes: Conjunctivae and EOM are normal.  Neck: Neck supple. No tracheal deviation present. No thyromegaly present.  No carotid bruit  Cardiovascular: Normal rate, regular rhythm, normal heart sounds and intact distal pulses.  2/6 systolic murmur heard. Pulmonary/Chest: Effort normal and breath sounds normal. No respiratory distress. He has no wheezes. He has no rales.  Abdominal: Soft. Ventral hernia - reducible, NT, He exhibits no distension. There is no tenderness.  Genitourinary: deferred  Musculoskeletal: He exhibits no edema.  Lymphadenopathy:   He has no cervical adenopathy.  Skin: Skin is warm and dry. He is not diaphoretic.  Psychiatric: He has a normal mood and affect. His behavior is normal.         Assessment & Plan:   Physical exam: Screening blood work  ordered Immunizations   Had both shingrix, others up to date Colonoscopy  Robinson 2009 - no longer needed due to age Eye exams   Up to date   EKG   Last Robinson 2016 Exercise no regular exercise -- stressed regular exercise Weight  - BMI good for age Skin  No concerns Substance abuse  none  See Problem List for Assessment and Plan of chronic medical problems.   FU one year

## 2017-10-23 NOTE — Patient Instructions (Addendum)
Try taking zantac nightly for your stomach.  Test(s) ordered today. Your results will be released to MyChart (or called to you) after review, usually within 72hours after test completion. If any changes need to be made, you will be notified at that same time.  All other Health Maintenance issues reviewed.   All recommended immunizations and age-appropriate screenings are up-to-date or discussed.  No immunizations administered today.   Medications reviewed and updated.  No changes recommended at this time.   Please followup in one year    Health Maintenance, Male A healthy lifestyle and preventive care is important for your health and wellness. Ask your health care provider about what schedule of regular examinations is right for you. What should I know about weight and diet? Eat a Healthy Diet  Eat plenty of vegetables, fruits, whole grains, low-fat dairy products, and lean protein.  Do not eat a lot of foods high in solid fats, added sugars, or salt.  Maintain a Healthy Weight Regular exercise can help you achieve or maintain a healthy weight. You should:  Do at least 150 minutes of exercise each week. The exercise should increase your heart rate and make you sweat (moderate-intensity exercise).  Do strength-training exercises at least twice a week.  Watch Your Levels of Cholesterol and Blood Lipids  Have your blood tested for lipids and cholesterol every 5 years starting at 81 years of age. If you are at high risk for heart disease, you should start having your blood tested when you are 81 years old. You may need to have your cholesterol levels checked more often if: ? Your lipid or cholesterol levels are high. ? You are older than 81 years of age. ? You are at high risk for heart disease.  What should I know about cancer screening? Many types of cancers can be detected early and may often be prevented. Lung Cancer  You should be screened every year for lung cancer  if: ? You are a current smoker who has smoked for at least 30 years. ? You are a former smoker who has quit within the past 15 years.  Talk to your health care provider about your screening options, when you should start screening, and how often you should be screened.  Colorectal Cancer  Routine colorectal cancer screening usually begins at 82 years of age and should be repeated every 5-10 years until you are 81 years old. You may need to be screened more often if early forms of precancerous polyps or small growths are found. Your health care provider may recommend screening at an earlier age if you have risk factors for colon cancer.  Your health care provider may recommend using home test kits to check for hidden blood in the stool.  A small camera at the end of a tube can be used to examine your colon (sigmoidoscopy or colonoscopy). This checks for the earliest forms of colorectal cancer.  Prostate and Testicular Cancer  Depending on your age and overall health, your health care provider may do certain tests to screen for prostate and testicular cancer.  Talk to your health care provider about any symptoms or concerns you have about testicular or prostate cancer.  Skin Cancer  Check your skin from head to toe regularly.  Tell your health care provider about any new moles or changes in moles, especially if: ? There is a change in a mole's size, shape, or color. ? You have a mole that is larger than a pencil  eraser.  Always use sunscreen. Apply sunscreen liberally and repeat throughout the day.  Protect yourself by wearing long sleeves, pants, a wide-brimmed hat, and sunglasses when outside.  What should I know about heart disease, diabetes, and high blood pressure?  If you are 105-52 years of age, have your blood pressure checked every 3-5 years. If you are 57 years of age or older, have your blood pressure checked every year. You should have your blood pressure measured  twice-once when you are at a hospital or clinic, and once when you are not at a hospital or clinic. Record the average of the two measurements. To check your blood pressure when you are not at a hospital or clinic, you can use: ? An automated blood pressure machine at a pharmacy. ? A home blood pressure monitor.  Talk to your health care provider about your target blood pressure.  If you are between 7-88 years old, ask your health care provider if you should take aspirin to prevent heart disease.  Have regular diabetes screenings by checking your fasting blood sugar level. ? If you are at a normal weight and have a low risk for diabetes, have this test once every three years after the age of 97. ? If you are overweight and have a high risk for diabetes, consider being tested at a younger age or more often.  A one-time screening for abdominal aortic aneurysm (AAA) by ultrasound is recommended for men aged 41-75 years who are current or former smokers. What should I know about preventing infection? Hepatitis B If you have a higher risk for hepatitis B, you should be screened for this virus. Talk with your health care provider to find out if you are at risk for hepatitis B infection. Hepatitis C Blood testing is recommended for:  Everyone born from [birth year] through 1963.  Anyone with known risk factors for hepatitis C.  Sexually Transmitted Diseases (STDs)  You should be screened each year for STDs including gonorrhea and chlamydia if: ? You are sexually active and are younger than 81 years of age. ? You are older than 81 years of age and your health care provider tells you that you are at risk for this type of infection. ? Your sexual activity has changed since you were last screened and you are at an increased risk for chlamydia or gonorrhea. Ask your health care provider if you are at risk.  Talk with your health care provider about whether you are at high risk of being infected with HIV.  Your health care provider may recommend a prescription medicine to help prevent HIV infection.  What else can I do?  Schedule regular health, dental, and eye exams.  Stay current with your vaccines (immunizations).  Do not use any tobacco products, such as cigarettes, chewing tobacco, and e-cigarettes. If you need help quitting, ask your health care provider.  Limit alcohol intake to no more than 2 drinks per day. One drink equals 12 ounces of beer, 5 ounces of wine, or 1 ounces of hard liquor.  Do not use street drugs.  Do not share needles.  Ask your health care provider for help if you need support or information about quitting drugs.  Tell your health care provider if you often feel depressed.  Tell your health care provider if you have ever been abused or do not feel safe at home. This information is not intended to replace advice given to you by your health care provider. Make sure you  discuss any questions you have with your health care provider. Document Released: 11/11/2007 Document Revised: 01/12/2016 Document Reviewed: 02/16/2015 Elsevier Interactive Patient Education  [employer].

## 2017-10-24 ENCOUNTER — Ambulatory Visit (INDEPENDENT_AMBULATORY_CARE_PROVIDER_SITE_OTHER): Payer: Medicare Other | Admitting: Internal Medicine

## 2017-10-24 ENCOUNTER — Other Ambulatory Visit (INDEPENDENT_AMBULATORY_CARE_PROVIDER_SITE_OTHER): Payer: Medicare Other

## 2017-10-24 ENCOUNTER — Encounter: Payer: Self-pay | Admitting: Internal Medicine

## 2017-10-24 VITALS — BP 124/50 | HR 75 | Temp 98.6°F | Resp 16 | Ht 70.0 in | Wt 189.0 lb

## 2017-10-24 DIAGNOSIS — E038 Other specified hypothyroidism: Secondary | ICD-10-CM | POA: Diagnosis not present

## 2017-10-24 DIAGNOSIS — I119 Hypertensive heart disease without heart failure: Secondary | ICD-10-CM | POA: Diagnosis not present

## 2017-10-24 DIAGNOSIS — I2510 Atherosclerotic heart disease of native coronary artery without angina pectoris: Secondary | ICD-10-CM

## 2017-10-24 DIAGNOSIS — K219 Gastro-esophageal reflux disease without esophagitis: Secondary | ICD-10-CM

## 2017-10-24 DIAGNOSIS — E782 Mixed hyperlipidemia: Secondary | ICD-10-CM | POA: Diagnosis not present

## 2017-10-24 DIAGNOSIS — G250 Essential tremor: Secondary | ICD-10-CM | POA: Diagnosis not present

## 2017-10-24 DIAGNOSIS — Z0001 Encounter for general adult medical examination with abnormal findings: Secondary | ICD-10-CM | POA: Diagnosis not present

## 2017-10-24 DIAGNOSIS — R7301 Impaired fasting glucose: Secondary | ICD-10-CM | POA: Diagnosis not present

## 2017-10-24 DIAGNOSIS — M069 Rheumatoid arthritis, unspecified: Secondary | ICD-10-CM | POA: Diagnosis not present

## 2017-10-24 LAB — LIPID PANEL
CHOLESTEROL: 137 mg/dL (ref 0–200)
HDL: 49.6 mg/dL (ref >39.00)
LDL Cholesterol: 66 mg/dL (ref 0–99)
NonHDL: 87.36
TRIGLYCERIDES: 105 mg/dL (ref 0.0–149.0)
Total CHOL/HDL Ratio: 3
VLDL: 21 mg/dL (ref 0.0–40.0)

## 2017-10-24 LAB — CBC WITH DIFFERENTIAL/PLATELET
BASOS ABS: 0.1 10*3/uL (ref 0.0–0.1)
Basophils Relative: 0.8 % (ref 0.0–3.0)
EOS ABS: 0.4 10*3/uL (ref 0.0–0.7)
Eosinophils Relative: 4.9 % (ref 0.0–5.0)
HCT: 33.9 % — ABNORMAL LOW (ref 39.0–52.0)
Hemoglobin: 11.4 g/dL — ABNORMAL LOW (ref 13.0–17.0)
LYMPHS PCT: 23.1 % (ref 12.0–46.0)
Lymphs Abs: 1.8 10*3/uL (ref 0.7–4.0)
MCHC: 33.6 g/dL (ref 30.0–36.0)
MCV: 100.1 fl — ABNORMAL HIGH (ref 78.0–100.0)
Monocytes Absolute: 1.1 10*3/uL — ABNORMAL HIGH (ref 0.1–1.0)
Monocytes Relative: 14 % — ABNORMAL HIGH (ref 3.0–12.0)
NEUTROS PCT: 57.2 % (ref 43.0–77.0)
Neutro Abs: 4.3 10*3/uL (ref 1.4–7.7)
PLATELETS: 251 10*3/uL (ref 150.0–400.0)
RBC: 3.38 Mil/uL — ABNORMAL LOW (ref 4.22–5.81)
RDW: 13.8 % (ref 11.5–15.5)
WBC: 7.6 10*3/uL (ref 4.0–10.5)

## 2017-10-24 LAB — COMPREHENSIVE METABOLIC PANEL
ALBUMIN: 4.2 g/dL (ref 3.5–5.2)
ALK PHOS: 31 U/L — AB (ref 39–117)
ALT: 16 U/L (ref 0–53)
AST: 28 U/L (ref 0–37)
BILIRUBIN TOTAL: 0.4 mg/dL (ref 0.2–1.2)
BUN: 18 mg/dL (ref 6–23)
CALCIUM: 9.5 mg/dL (ref 8.4–10.5)
CO2: 28 mEq/L (ref 19–32)
CREATININE: 0.98 mg/dL (ref 0.40–1.50)
Chloride: 102 mEq/L (ref 96–112)
GFR: 78.04 mL/min (ref >60.00)
Glucose, Bld: 104 mg/dL — ABNORMAL HIGH (ref 70–99)
Potassium: 4.4 mEq/L (ref 3.5–5.1)
Sodium: 136 mEq/L (ref 135–145)
TOTAL PROTEIN: 7.7 g/dL (ref 6.0–8.3)

## 2017-10-24 LAB — TSH: TSH: 2.3 u[IU]/mL (ref 0.35–4.50)

## 2017-10-24 LAB — HEMOGLOBIN A1C: Hgb A1c MFr Bld: 5.9 % (ref 4.6–6.5)

## 2017-10-24 NOTE — Assessment & Plan Note (Signed)
Has intermittent gerd Advised to start zantac nightly

## 2017-10-24 NOTE — Assessment & Plan Note (Signed)
Check lipid panel  Continue daily statin Regular exercise and healthy diet encouraged  

## 2017-10-24 NOTE — Assessment & Plan Note (Signed)
BP well controlled Current regimen effective and well tolerated Continue current medications at current doses cmp  

## 2017-10-24 NOTE — Assessment & Plan Note (Signed)
a1c

## 2017-10-24 NOTE — Assessment & Plan Note (Signed)
Check tsh  Titrate med dose if needed  

## 2017-10-24 NOTE — Assessment & Plan Note (Signed)
Taking prednisone as needed only Following with Dr Dierdre Forth

## 2017-10-24 NOTE — Assessment & Plan Note (Signed)
Dr Terrace Arabia Taking primidone - helps some

## 2017-10-24 NOTE — Assessment & Plan Note (Signed)
No chest pain, palpitations Encouraged regular exercise Continue current medications

## 2017-10-30 DIAGNOSIS — M5127 Other intervertebral disc displacement, lumbosacral region: Secondary | ICD-10-CM | POA: Diagnosis not present

## 2017-10-30 DIAGNOSIS — M545 Low back pain: Secondary | ICD-10-CM | POA: Diagnosis not present

## 2017-10-30 DIAGNOSIS — M48061 Spinal stenosis, lumbar region without neurogenic claudication: Secondary | ICD-10-CM | POA: Diagnosis not present

## 2017-10-31 DIAGNOSIS — M0579 Rheumatoid arthritis with rheumatoid factor of multiple sites without organ or systems involvement: Secondary | ICD-10-CM | POA: Diagnosis not present

## 2017-10-31 DIAGNOSIS — M150 Primary generalized (osteo)arthritis: Secondary | ICD-10-CM | POA: Diagnosis not present

## 2017-10-31 DIAGNOSIS — M5136 Other intervertebral disc degeneration, lumbar region: Secondary | ICD-10-CM | POA: Diagnosis not present

## 2017-11-06 DIAGNOSIS — M545 Low back pain: Secondary | ICD-10-CM | POA: Diagnosis not present

## 2017-11-20 ENCOUNTER — Other Ambulatory Visit: Payer: Self-pay | Admitting: Internal Medicine

## 2017-11-20 ENCOUNTER — Other Ambulatory Visit: Payer: Self-pay | Admitting: Neurology

## 2017-11-21 ENCOUNTER — Other Ambulatory Visit: Payer: Self-pay | Admitting: Emergency Medicine

## 2017-11-21 NOTE — Telephone Encounter (Signed)
Last filled by Dr Terrace Arabia in 2017. Okay to refill?

## 2017-11-22 NOTE — Telephone Encounter (Signed)
He should see Dr Terrace Arabia for this

## 2017-11-23 ENCOUNTER — Telehealth: Payer: Self-pay | Admitting: Neurology

## 2017-11-23 MED ORDER — PRIMIDONE 50 MG PO TABS: 100.0000 mg | ORAL_TABLET | Freq: Three times a day (TID) | ORAL | 0 refills | Status: DC

## 2017-11-23 NOTE — Telephone Encounter (Signed)
Per vo by Dr. Terrace Arabia, we can send in 30-day supply for patient.  He will need to keep his appt on 11/27/17 to continue refills.

## 2017-11-23 NOTE — Addendum Note (Signed)
Addended by: Lindell Spar C on: 11/23/2017 12:27 PM   Modules accepted: Orders

## 2017-11-23 NOTE — Telephone Encounter (Signed)
Pt has called for a refill on his primidone (MYSOLINE) 50 MG tablet.  Pt was reminded he has not seen Dr Terrace Arabia since 2017.  Pt agreed to schedule an office visit for 2nd of July.  Pt would like to know if his primidone (MYSOLINE) 50 MG tablet could be called into  [facility] [address], [place] - [address] [phone] (Phone) [phone] (Fax)     Please call

## 2017-11-27 ENCOUNTER — Telehealth: Payer: Self-pay | Admitting: *Deleted

## 2017-11-27 ENCOUNTER — Ambulatory Visit: Payer: Self-pay | Admitting: Neurology

## 2017-11-27 ENCOUNTER — Encounter: Payer: Self-pay | Admitting: Neurology

## 2017-11-27 NOTE — Telephone Encounter (Signed)
No showed follow up appointment. 

## 2017-12-06 DIAGNOSIS — M545 Low back pain: Secondary | ICD-10-CM | POA: Diagnosis not present

## 2018-01-10 DIAGNOSIS — M47816 Spondylosis without myelopathy or radiculopathy, lumbar region: Secondary | ICD-10-CM | POA: Diagnosis not present

## 2018-01-16 DIAGNOSIS — H524 Presbyopia: Secondary | ICD-10-CM | POA: Diagnosis not present

## 2018-01-16 DIAGNOSIS — Z79899 Other long term (current) drug therapy: Secondary | ICD-10-CM | POA: Diagnosis not present

## 2018-01-16 DIAGNOSIS — Z961 Presence of intraocular lens: Secondary | ICD-10-CM | POA: Diagnosis not present

## 2018-01-16 DIAGNOSIS — M069 Rheumatoid arthritis, unspecified: Secondary | ICD-10-CM | POA: Diagnosis not present

## 2018-01-21 ENCOUNTER — Other Ambulatory Visit: Payer: Self-pay | Admitting: Internal Medicine

## 2018-01-24 DIAGNOSIS — M47816 Spondylosis without myelopathy or radiculopathy, lumbar region: Secondary | ICD-10-CM | POA: Diagnosis not present

## 2018-01-29 DIAGNOSIS — M5136 Other intervertebral disc degeneration, lumbar region: Secondary | ICD-10-CM | POA: Diagnosis not present

## 2018-01-29 DIAGNOSIS — M0579 Rheumatoid arthritis with rheumatoid factor of multiple sites without organ or systems involvement: Secondary | ICD-10-CM | POA: Diagnosis not present

## 2018-01-29 DIAGNOSIS — M2550 Pain in unspecified joint: Secondary | ICD-10-CM | POA: Diagnosis not present

## 2018-01-29 DIAGNOSIS — M150 Primary generalized (osteo)arthritis: Secondary | ICD-10-CM | POA: Diagnosis not present

## 2018-02-12 DIAGNOSIS — M47816 Spondylosis without myelopathy or radiculopathy, lumbar region: Secondary | ICD-10-CM | POA: Diagnosis not present

## 2018-02-22 ENCOUNTER — Other Ambulatory Visit: Payer: Self-pay

## 2018-02-22 NOTE — Patient Outreach (Signed)
[facility]) Care Management  02/22/2018  WYLAND RASTETTER 09-02-1934 [id number]   Medication Adherence call to Mr. Joban Colledge patient did not answer patient is due on Rosuvastatin 20 mg. Mr. Keltz is showing past due under [employer].   Lillia Abed CPhT Pharmacy Technician [facility] Care Management Direct Dial [phone]  Fax [phone] [email or web address]

## 2018-02-28 ENCOUNTER — Telehealth: Payer: Self-pay | Admitting: Neurology

## 2018-02-28 ENCOUNTER — Encounter: Payer: Self-pay | Admitting: Neurology

## 2018-02-28 ENCOUNTER — Ambulatory Visit: Payer: Medicare Other | Admitting: Neurology

## 2018-02-28 VITALS — BP 155/77 | HR 65 | Ht 70.0 in | Wt 178.0 lb

## 2018-02-28 DIAGNOSIS — R413 Other amnesia: Secondary | ICD-10-CM

## 2018-02-28 DIAGNOSIS — R269 Unspecified abnormalities of gait and mobility: Secondary | ICD-10-CM

## 2018-02-28 DIAGNOSIS — G250 Essential tremor: Secondary | ICD-10-CM | POA: Diagnosis not present

## 2018-02-28 MED ORDER — PRIMIDONE 50 MG PO TABS: 100.0000 mg | ORAL_TABLET | Freq: Three times a day (TID) | ORAL | 4 refills | Status: DC

## 2018-02-28 NOTE — Telephone Encounter (Signed)
[employer] medicare order sent to GI. No auth they will reach out to the pt to schedule.  °

## 2018-02-28 NOTE — Progress Notes (Signed)
Chief Complaint  Patient presents with  . Tremors    Last seen 02/2016.  Reports tremors to be worse, despite taking primidone 50mg , two tablets three times daily.     [facility]  PATIENT: DENHAM MOSE DOB: March 10, 1935   REASON FOR VISIT: Follow-up for essential tremor  HISTORY OF PRESENT ILLNESS:Mr Kea, 83 year old white male returns for followup. He has history of bilateral hand tremor for greater than 12 years.  He has been a patient of our clinic Since 2010, bilateral hands, and head tremor, no family history of tremor, retired now but wants to continue medication because of difficulty writing, using keyboard,  knee replacement, mild gait difficulty. He is currently on primidone 100 mg twice daily with good benefit. He denies any side effects of the medication. He has past medical history of depression,anxiety, hyperlipidemia, coronary artery disease.  No gait difficulty, no limitation on his daily activity, performs all activities of daily living without difficulty. Has a history of ringing in his ears and hearing loss due to working in a noisy environment for years. He gets very little exercise. He returns for reevaluation  UPDATE Mar 25 2015: He still has bilateral hand tremor,  Head titubation, voice stammering, no gait diffiuclty, no Significant loss of smell, He wake up a lot in the middle of the night,  He has lumbar degenerative disc disease , also rheumatoid arthritis, complains of low back pain, multiple joints pain, could not exercise much..  He is taking primidone 50mg  ii bid, which seems to help him, no significant side effect,  UPDATE Mar 27 2016: He complains of worsening hand tremor, voice tremor, head titubation, he is still taking tramadol 50 mg 2 tablets 3 times a day, which help his tremor some  He also has mild memory loss, he has trouble with name, he lives with his wife, drove to clinic without any difficulty today, He has chronic low  back pain, radiating pain to right lower extremity, gait abnormality, was under the care of orthopedic surgeon had epidural injection without significant improvement, denies bowel and bladder incontinence,  He has rheumatoid arthritis    UPDATE Feb 28 2018: HE is taking more primidone 50mg  ii tid, sometimes bid, it helps him, he denies significant side effect, today he complains of short-term memory loss, he desires further evaluation with MRI of brain  Laboratory evaluation showed normal or negative A1c 5.9, CBC with hemoglobin of 11.4, normal CMP, lipid profile, TSH  REVIEW OF SYSTEMS: Full 14 system review of systems performed and notable only for those listed, all others are neg:   as above  ALLERGIES: Allergies  Allergen Reactions  . Sulfonamide Derivatives Rash    Medi Alert bracelet  is recommended  . Atorvastatin Other (See Comments)    Cramping    . Pravastatin Sodium Other (See Comments)    Cramp   . Simvastatin Other (See Comments)    Cramps     HOME MEDICATIONS: Outpatient Medications Prior to Visit  Medication Sig Dispense Refill  . aspirin 325 MG tablet Take 325 mg by mouth daily.    . DULoxetine (CYMBALTA) 60 MG capsule Take 60 mg by mouth daily.     . folic acid (FOLVITE) 1 MG tablet Take 1 mg by mouth daily.   3  . hydroxychloroquine (PLAQUENIL) 200 MG tablet     . levothyroxine (SYNTHROID, LEVOTHROID) 88 MCG tablet TAKE 1 TABLET BY MOUTH ONCE DAILY 90 tablet 1  . methotrexate 50 MG/2ML injection  Inject 125 mg into the vein once a week.    . metoprolol succinate (TOPROL-XL) 25 MG 24 hr tablet TAKE 1 TABLET BY MOUTH ONCE DAILY 90 tablet 1  . primidone (MYSOLINE) 50 MG tablet Take 2 tablets (100 mg total) by mouth 3 (three) times daily. 540 tablet 0  . rosuvastatin (CRESTOR) 20 MG tablet TAKE 1 TABLET BY MOUTH ONCE DAILY AT BEDTIME 90 tablet 3  . sulfaSALAzine (AZULFIDINE) 500 MG tablet Take 1,000 mg by mouth 2 (two) times daily.    Marland Kitchen acetaminophen (TYLENOL)  500 MG tablet Take 1,000 mg by mouth every 6 (six) hours as needed for moderate pain or headache. Reported on 09/20/2015    . methotrexate 50 MG/2ML injection     . polyethylene glycol (MIRALAX / GLYCOLAX) packet Take 17 g by mouth daily as needed for mild constipation. 14 each 0  . predniSONE (DELTASONE) 5 MG tablet TAKE 1 TABLET BY MOUTH IN THE MORNING 90 tablet 1   No facility-administered medications prior to visit.     PAST MEDICAL HISTORY: Past Medical History:  Diagnosis Date  . Anemia 2010   H/H  12.9/38  . Asthma    hx of   . CAD (coronary artery disease)    Dr Gala Romney  . Depression   . Diverticulosis 2004   [facility]; due 2014  . GERD (gastroesophageal reflux disease)    NO MEDS  . Movement disorder    essential tremors  . Other and unspecified hyperlipidemia   . RA (rheumatoid arthritis) (HCC)    Dr Dierdre Forth  AND OA BOTH KNEES AND HANDS  . Unspecified essential hypertension     PAST SURGICAL HISTORY: Past Surgical History:  Procedure Laterality Date  . ANGIOPLASTY  1997   3 stents  . BACK SURGERY  1995   for ruptured disc LS spine  . CARDIAC CATHETERIZATION     coronary stents x3- placed 20 yrs ago.  Marland Kitchen CARPAL TUNNEL RELEASE     bilateral  . CATARACT EXTRACTION, BILATERAL  2011  . CHOLECYSTECTOMY N/A 02/13/2013   Procedure: LAPAROSCOPIC CHOLECYSTECTOMY WITH INTRAOPERATIVE CHOLANGIOGRAM;  Surgeon: Clovis Pu. Cornett, MD;  Location: [facility] OR;  Service: General;  Laterality: N/A;  . COLONOSCOPY  2004   Diverticulosis  . KNEE SURGERY     x 2  . post op transfusion  9/14   for anemia  . PROSTATE SURGERY     BPH  . SHOULDER SURGERY  2005  . TONSILLECTOMY    . TOTAL KNEE ARTHROPLASTY  08/07/2011   Procedure: TOTAL KNEE ARTHROPLASTY;  Surgeon: Loanne Drilling, MD;  Location: [facility];  Service: Orthopedics;  Laterality: Right;  . TOTAL KNEE ARTHROPLASTY Left 08/03/2014   Procedure: LEFT TOTAL KNEE ARTHROPLASTY;  Surgeon: Ollen Gross, MD;  Location: [facility];   Service: Orthopedics;  Laterality: Left;  . TRANSURETHRAL RESECTION OF PROSTATE N/A 04/10/2014   Procedure: TRANSURETHRAL RESECTION OF THE PROSTATE WITH GYRUS INSTRUMENTS;  Surgeon: Garnett Farm, MD;  Location: [facility];  Service: Urology;  Laterality: N/A;  . TRIGGER FINGER RELEASE     multiple    FAMILY HISTORY: Family History  Problem Relation Age of Onset  . Cancer Father        esophagus  . Stroke Mother        in mid 46s  . Arthritis Mother   . Diabetes Brother   . Heart attack Paternal Grandmother        ? in 56s  . Diabetes  Maternal Aunt   . Prostate cancer Maternal Uncle   . Cancer Maternal Uncle   . Prostate cancer Cousin   . Cancer Cousin   . Liver cancer Unknown        Paternal family history  . Leukemia Unknown        Paternal family history  . Cancer Cousin     SOCIAL HISTORY: Social History   Socioeconomic History  . Marital status: Married    Spouse name: Steward Drone  . Number of children: 1  . Years of education: 23  . Highest education level: Not on file  Occupational History    Comment: retired  Engineer, production  . Financial resource strain: Not hard at all  . Food insecurity:    Worry: Never true    Inability: Never true  . Transportation needs:    Medical: No    Non-medical: No  Tobacco Use  . Smoking status: Former Smoker    Packs/day: 2.00    Types: Cigarettes    Last attempt to quit: 05/29/1972    Years since quitting: 45.7  . Smokeless tobacco: Never Used  . Tobacco comment: smoked 1957-1974, up to 2 ppd  occ alcohol  Substance and Sexual Activity  . Alcohol use: Yes    Alcohol/week: 1.0 standard drinks    Types: 1 Cans of beer per week    Comment: beer rarely  . Drug use: No  . Sexual activity: Never  Lifestyle  . Physical activity:    Days per week: 0 days    Minutes per session: 0 min  . Stress: Very much  Relationships  . Social connections:    Talks on phone: More than three times a week    Gets together: More than three times  a week    Attends religious service: Not on file    Active member of club or organization: Not on file    Attends meetings of clubs or organizations: Not on file    Relationship status: Married  . Intimate partner violence:    Fear of current or ex partner: No    Emotionally abused: No    Physically abused: No    Forced sexual activity: No  Other Topics Concern  . Not on file  Social History Narrative   Patient lives with his wife Steward Drone).   Patient has 1 child.   Patient has a high school education.   Patient is right-handed.   Patient is retired.      Exercise: no     PHYSICAL EXAM  Vitals:   02/28/18 1150  BP: (!) 155/77  Pulse: 65  Weight: 178 lb (80.7 kg)  Height: 5\' 10"  (1.778 m)   Body mass index is 25.54 kg/m.   PHYSICAL EXAMNIATION:  Gen: NAD, conversant, well nourised, obese, well groomed                     Cardiovascular: Regular rate rhythm, no peripheral edema, warm, nontender. Eyes: Conjunctivae clear without exudates or hemorrhage Neck: Supple, no carotid bruise. Pulmonary: Clear to auscultation bilaterally   NEUROLOGICAL EXAM: MMSE - Mini Mental State Exam 02/28/2018 09/20/2017  Orientation to time 5 5  Orientation to Place 5 5  Registration 3 3  Attention/ Calculation 5 4  Recall 3 2  Language- name 2 objects 2 2  Language- repeat 1 1  Language- follow 3 step command 3 3  Language- read & follow direction 1 1  Write a sentence 1 1  Copy design 1 1  Total score 30 28   CRANIAL NERVES: CN II: Visual fields are full to confrontation. Fundoscopic exam is normal with sharp discs and no vascular changes. Pupils are round equal and briskly reactive to light. CN III, IV, VI: extraocular movement are normal. No ptosis. CN V: Facial sensation is intact to pinprick in all 3 divisions bilaterally. Corneal responses are intact.  CN VII: Face is symmetric with normal eye closure and smile. CN VIII: Hearing is normal to rubbing fingers CN IX, X:  Palate elevates symmetrically. Phonation is normal. CN XI: Head turning and shoulder shrug are intact CN XII: Tongue is midline with normal movements and no atrophy.  MOTOR:  mild bilateral hands postural tremor, head titubation, no significant rigidity , bradykinesia. He has mild right ankle dorsiflexion weakness   REFLEXES: Reflexes are 2+ and symmetric at the biceps, triceps, knees, and ankles. Plantar responses are flexor.  SENSORY: Intact to light touch, pinprick, position sense, and vibration sense are intact in fingers and toes.  COORDINATION: Rapid alternating movements and fine finger movements are intact. There is no dysmetria on finger-to-nose and heel-knee-shin.    GAIT/STANCE:  he needs push up to get up from seated position, mild bilateral foot drop right worse than left    DIAGNOSTIC DATA (LABS, IMAGING, TESTING) - I reviewed patient records, labs, notes, testing and imaging myself where available.  Lab Results  Component Value Date   WBC 7.6 10/24/2017   HGB 11.4 (L) 10/24/2017   HCT 33.9 (L) 10/24/2017   MCV 100.1 (H) 10/24/2017   PLT 251.0 10/24/2017      Component Value Date/Time   NA 136 10/24/2017 1441   K 4.4 10/24/2017 1441   CL 102 10/24/2017 1441   CO2 28 10/24/2017 1441   GLUCOSE 104 (H) 10/24/2017 1441   BUN 18 10/24/2017 1441   CREATININE 0.98 10/24/2017 1441   CALCIUM 9.5 10/24/2017 1441   PROT 7.7 10/24/2017 1441   ALBUMIN 4.2 10/24/2017 1441   AST 28 10/24/2017 1441   ALT 16 10/24/2017 1441   ALKPHOS 31 (L) 10/24/2017 1441   BILITOT 0.4 10/24/2017 1441   GFRNONAA 86 (L) 08/05/2014 0430   GFRAA >90 08/05/2014 0430    Lab Results  Component Value Date   HGBA1C 5.9 10/24/2017     ASSESSMENT AND PLAN  83 y.o. year old male    Worsening essential tremor  Keep primidone to 50 mg 2 tablets 3 times a day  Short-term memory loss  Examination 30 out of 30  Complete evaluation with MRI of the brain  Levert Feinstein, M.D.  Ph.D.  [facility] [address] [place], [place] [address] Phone: [phone] Fax:      [phone]

## 2018-03-10 ENCOUNTER — Ambulatory Visit
Admission: RE | Admit: 2018-03-10 | Discharge: 2018-03-10 | Disposition: A | Discharge status: Home / Self Care | Payer: Medicare Other | Source: Ambulatory Visit | Attending: Neurology | Admitting: Neurology

## 2018-03-10 DIAGNOSIS — R413 Other amnesia: Secondary | ICD-10-CM

## 2018-03-11 ENCOUNTER — Telehealth: Payer: Self-pay | Admitting: Neurology

## 2018-03-11 NOTE — Telephone Encounter (Signed)
MRI brain showed generalized atrophy, small vessel disease, I will review MRI films with him at next followup   IMPRESSION: Slightly abnormal MRI scan of the brain showing only age-appropriate changes of chronic microvascular ischemia and generalized cerebral atrophy.

## 2018-03-12 NOTE — Telephone Encounter (Signed)
I called and spoke with patient. He is aware of MRI results. He voiced understanding and appreciation and did not have any questions at this time.

## 2018-03-18 DIAGNOSIS — M47816 Spondylosis without myelopathy or radiculopathy, lumbar region: Secondary | ICD-10-CM | POA: Diagnosis not present

## 2018-04-23 DIAGNOSIS — M47816 Spondylosis without myelopathy or radiculopathy, lumbar region: Secondary | ICD-10-CM | POA: Diagnosis not present

## 2018-04-30 DIAGNOSIS — M5136 Other intervertebral disc degeneration, lumbar region: Secondary | ICD-10-CM | POA: Diagnosis not present

## 2018-04-30 DIAGNOSIS — M0579 Rheumatoid arthritis with rheumatoid factor of multiple sites without organ or systems involvement: Secondary | ICD-10-CM | POA: Diagnosis not present

## 2018-04-30 DIAGNOSIS — M2550 Pain in unspecified joint: Secondary | ICD-10-CM | POA: Diagnosis not present

## 2018-04-30 DIAGNOSIS — M150 Primary generalized (osteo)arthritis: Secondary | ICD-10-CM | POA: Diagnosis not present

## 2018-06-24 ENCOUNTER — Other Ambulatory Visit: Payer: Self-pay | Admitting: Internal Medicine

## 2018-07-30 DIAGNOSIS — M5136 Other intervertebral disc degeneration, lumbar region: Secondary | ICD-10-CM | POA: Diagnosis not present

## 2018-07-30 DIAGNOSIS — M150 Primary generalized (osteo)arthritis: Secondary | ICD-10-CM | POA: Diagnosis not present

## 2018-07-30 DIAGNOSIS — M2550 Pain in unspecified joint: Secondary | ICD-10-CM | POA: Diagnosis not present

## 2018-07-30 DIAGNOSIS — M0579 Rheumatoid arthritis with rheumatoid factor of multiple sites without organ or systems involvement: Secondary | ICD-10-CM | POA: Diagnosis not present

## 2018-09-18 ENCOUNTER — Other Ambulatory Visit: Payer: Self-pay | Admitting: Internal Medicine

## 2018-09-18 NOTE — Telephone Encounter (Signed)
Should be prescribed by rheumatology - dr Dierdre Forth

## 2018-09-18 NOTE — Telephone Encounter (Signed)
Please advise 

## 2018-11-04 DIAGNOSIS — M2550 Pain in unspecified joint: Secondary | ICD-10-CM | POA: Diagnosis not present

## 2018-11-04 DIAGNOSIS — M5136 Other intervertebral disc degeneration, lumbar region: Secondary | ICD-10-CM | POA: Diagnosis not present

## 2018-11-04 DIAGNOSIS — M0579 Rheumatoid arthritis with rheumatoid factor of multiple sites without organ or systems involvement: Secondary | ICD-10-CM | POA: Diagnosis not present

## 2018-12-16 ENCOUNTER — Other Ambulatory Visit: Payer: Self-pay | Admitting: Internal Medicine

## 2018-12-17 ENCOUNTER — Other Ambulatory Visit: Payer: Self-pay | Admitting: Internal Medicine

## 2019-02-04 DIAGNOSIS — M25511 Pain in right shoulder: Secondary | ICD-10-CM | POA: Diagnosis not present

## 2019-02-04 DIAGNOSIS — M5136 Other intervertebral disc degeneration, lumbar region: Secondary | ICD-10-CM | POA: Diagnosis not present

## 2019-02-04 DIAGNOSIS — M0579 Rheumatoid arthritis with rheumatoid factor of multiple sites without organ or systems involvement: Secondary | ICD-10-CM | POA: Diagnosis not present

## 2019-02-04 DIAGNOSIS — M2550 Pain in unspecified joint: Secondary | ICD-10-CM | POA: Diagnosis not present

## 2019-02-25 ENCOUNTER — Other Ambulatory Visit: Payer: Self-pay | Admitting: Internal Medicine

## 2019-03-02 NOTE — Progress Notes (Signed)
PATIENT: Ethan Robinson DOB: [birth year]  REASON FOR VISIT: follow up HISTORY FROM: patient  HISTORY OF PRESENT ILLNESS: Today 03/03/19  HISTORY  HISTORY OF PRESENT ILLNESS:Ethan Robinson, 84 year old white male returns for followup. He has history of bilateral hand tremor for greater than 12 years.  He has been a patient of our clinic Since 2010, bilateral hands, and head tremor, no family history of tremor, retired now but wants to continue medication because of difficulty writing, using keyboard,  knee replacement, mild gait difficulty. He is currently on primidone 100 mg twice daily with good benefit. He denies any side effects of the medication. He has past medical history of depression,anxiety, hyperlipidemia, coronary artery disease.  No gait difficulty, no limitation on his daily activity, performs all activities of daily living without difficulty. Has a history of ringing in his ears and hearing loss due to working in a noisy environment for years. He gets very little exercise. He returns for reevaluation  UPDATE Mar 25 2015: He still has bilateral hand tremor,  Head titubation, voice stammering, no gait diffiuclty, no Significant loss of smell, He wake up a lot in the middle of the night,  He has lumbar degenerative disc disease , also rheumatoid arthritis, complains of low back pain, multiple joints pain, could not exercise much..  He is taking primidone 50mg  ii bid, which seems to help him, no significant side effect,  UPDATE Mar 27 2016: He complains of worsening hand tremor, voice tremor, head titubation, he is still taking tramadol 50 mg 2 tablets 3 times a day, which help his tremor some  He also has mild memory loss, he has trouble with name, he lives with his wife, drove to clinic without any difficulty today, He has chronic low back pain, radiating pain to right lower extremity, gait abnormality, was under the care of orthopedic surgeon had epidural injection without  significant improvement, denies bowel and bladder incontinence,  He has rheumatoid arthritis    UPDATE Feb 28 2018: HE is taking more primidone 50mg  ii tid, sometimes bid, it helps him, he denies significant side effect, today he complains of short-term memory loss, he desires further evaluation with MRI of brain  Laboratory evaluation showed normal or negative A1c 5.9, CBC with hemoglobin of 11.4, normal CMP, lipid profile, TSH  Update March 03, 2019 SS: MRI of the brain 03/10/2018 showed generalized atrophy, small vessel disease.  He reports since last year, his tremors have worsened, his memory loss has progressed.  He feels very depressed about his wife being in a nursing home, after she fell and broke her hip.  He is not able to see her, and it is very expensive to have her in a nursing home.  At times, he cannot sleep because he feels so depressed.  He reports the primidone is helpful at times.  He reports continued difficulty with short-term memory, trouble remembering names.  He lives alone, he drives a car without difficulty. He does his own light cooking.  He manages his medications.  He reports he does not see his primary care doctor regularly. He has not had recent falls, but at times feels off balance.   REVIEW OF SYSTEMS: Out of a complete 14 system review of symptoms, the patient complains only of the following symptoms, and all other reviewed systems are negative.  Tremor, memory loss  ALLERGIES: Allergies  Allergen Reactions   Sulfonamide Derivatives Rash    Medi Alert bracelet  is recommended  Atorvastatin Other (See Comments)    Cramping     Pravastatin Sodium Other (See Comments)    Cramp    Simvastatin Other (See Comments)    Cramps     HOME MEDICATIONS: Outpatient Medications Prior to Visit  Medication Sig Dispense Refill   aspirin 325 MG tablet Take 325 mg by mouth daily.     folic acid (FOLVITE) 1 MG tablet Take 1 mg by mouth daily.   3    hydroxychloroquine (PLAQUENIL) 200 MG tablet      levothyroxine (SYNTHROID, LEVOTHROID) 88 MCG tablet TAKE 1 TABLET BY MOUTH ONCE DAILY 90 tablet 1   methotrexate 50 MG/2ML injection Inject 125 mg into the vein once a week.     metoprolol succinate (TOPROL-XL) 25 MG 24 hr tablet Take 1 tablet (25 mg total) by mouth daily. -- Office visit needed for further refills 90 tablet 0   rosuvastatin (CRESTOR) 20 MG tablet TAKE 1 TABLET BY MOUTH ONCE DAILY AT BEDTIME 90 tablet 3   sulfaSALAzine (AZULFIDINE) 500 MG tablet Take 1,000 mg by mouth 2 (two) times daily.     primidone (MYSOLINE) 50 MG tablet Take 2 tablets (100 mg total) by mouth 3 (three) times daily. 540 tablet 4   DULoxetine (CYMBALTA) 60 MG capsule Take 60 mg by mouth daily.      No facility-administered medications prior to visit.     PAST MEDICAL HISTORY: Past Medical History:  Diagnosis Date   Anemia 2010   H/H  12.9/38   Asthma    hx of    CAD (coronary artery disease)    Dr Gala RomneyBensimhon   Depression    Diverticulosis 2004   [facility]; due 2014   GERD (gastroesophageal reflux disease)    NO MEDS   Movement disorder    essential tremors   Other and unspecified hyperlipidemia    RA (rheumatoid arthritis) (HCC)    Dr Dierdre ForthBeekman  AND OA BOTH KNEES AND HANDS   Unspecified essential hypertension     PAST SURGICAL HISTORY: Past Surgical History:  Procedure Laterality Date   ANGIOPLASTY  1997   3 stents   BACK SURGERY  1995   for ruptured disc LS spine   CARDIAC CATHETERIZATION     coronary stents x3- placed 20 yrs ago.   CARPAL TUNNEL RELEASE     bilateral   CATARACT EXTRACTION, BILATERAL  2011   CHOLECYSTECTOMY N/A 02/13/2013   Procedure: LAPAROSCOPIC CHOLECYSTECTOMY WITH INTRAOPERATIVE CHOLANGIOGRAM;  Surgeon: Clovis Puhomas A. Cornett, MD;  Location: [facility];  Service: General;  Laterality: N/A;   COLONOSCOPY  2004   Diverticulosis   KNEE SURGERY     x 2   post op transfusion  9/14   for anemia    PROSTATE SURGERY     BPH   SHOULDER SURGERY  2005   TONSILLECTOMY     TOTAL KNEE ARTHROPLASTY  08/07/2011   Procedure: TOTAL KNEE ARTHROPLASTY;  Surgeon: Loanne DrillingFrank V Aluisio, MD;  Location: [facility];  Service: Orthopedics;  Laterality: Right;   TOTAL KNEE ARTHROPLASTY Left 08/03/2014   Procedure: LEFT TOTAL KNEE ARTHROPLASTY;  Surgeon: Ollen GrossFrank Aluisio, MD;  Location: [facility];  Service: Orthopedics;  Laterality: Left;   TRANSURETHRAL RESECTION OF PROSTATE N/A 04/10/2014   Procedure: TRANSURETHRAL RESECTION OF THE PROSTATE WITH GYRUS INSTRUMENTS;  Surgeon: Garnett FarmMark C Ottelin, MD;  Location: [facility];  Service: Urology;  Laterality: N/A;   TRIGGER FINGER RELEASE     multiple    FAMILY HISTORY: Family History  Problem Relation Age of Onset   Cancer Father        esophagus   Stroke Mother        in mid 79s   Arthritis Mother    Diabetes Brother    Heart attack Paternal Grandmother        ? in 74s   Diabetes Maternal Aunt    Prostate cancer Maternal Uncle    Cancer Maternal Uncle    Prostate cancer Cousin    Cancer Cousin    Liver cancer Unknown        Paternal family history   Leukemia Unknown        Paternal family history   Cancer Cousin     SOCIAL HISTORY: Social History   Socioeconomic History   Marital status: Married    Spouse name: Ethan Robinson   Number of children: 1   Years of education: 12   Highest education level: Not on file  Occupational History    Comment: retired  Scientist, product/process development strain: Not hard at all   Food insecurity    Worry: Never true    Inability: Never true   Transportation needs    Medical: No    Non-medical: No  Tobacco Use   Smoking status: Former Smoker    Packs/day: 2.00    Types: Cigarettes    Quit date: 05/29/1972    Years since quitting: 46.7   Smokeless tobacco: Never Used   Tobacco comment: smoked 1957-1974, up to 2 ppd  occ alcohol  Substance and Sexual Activity   Alcohol use: Yes    Alcohol/week:  1.0 standard drinks    Types: 1 Cans of beer per week    Comment: beer rarely   Drug use: No   Sexual activity: Never  Lifestyle   Physical activity    Days per week: 0 days    Minutes per session: 0 min   Stress: Very much  Relationships   Social connections    Talks on phone: More than three times a week    Gets together: More than three times a week    Attends religious service: Not on file    Active member of club or organization: Not on file    Attends meetings of clubs or organizations: Not on file    Relationship status: Married   Intimate partner violence    Fear of current or ex partner: No    Emotionally abused: No    Physically abused: No    Forced sexual activity: No  Other Topics Concern   Not on file  Social History Narrative   Patient lives with his wife Ethan Robinson).   Patient has 1 child.   Patient has a high school education.   Patient is right-handed.   Patient is retired.      Exercise: no    PHYSICAL EXAM  Vitals:   03/03/19 1516  BP: 126/66  Pulse: 68  Temp: 97.7 F (36.5 C)  TempSrc: Oral  Weight: 178 lb (80.7 kg)  Height: 5\' 11"  (1.803 m)   Body mass index is 24.83 kg/m.  Generalized: Well developed, in no acute distress  MMSE - Mini Mental State Exam 03/03/2019 02/28/2018 09/20/2017  Not completed: (No Data) - -  Orientation to time 5 5 5   Orientation to Place 5 5 5   Registration 3 3 3   Attention/ Calculation 4 5 4   Recall 2 3 2   Language- name 2 objects 2 2 2   Language-  repeat Language- follow 3 step command Language- read & follow direction Write a sentence 0 1 1  Write a sentence-comments tremors have effected his writing - -  Copy design 0 1 1  Copy design-comments tremors have effected his writing - -  Total score Neurological examination  Mentation: Alert oriented to time, place, history taking. Follows all commands speech and language fluent, vocal tremor is noted Cranial nerve  II-XII: Pupils were equal round reactive to light. Extraocular movements were full, visual field were full on confrontational test. Facial sensation and strength were normal.  Head turning and shoulder shrug  were normal and symmetric. Motor: The motor testing reveals 5 over 5 strength of all 4 extremities. Good symmetric motor tone is noted throughout.  Mild tremor to bilateral hands at rest and with intention, head titubation noted,  Sensory: Sensory testing is intact to soft touch on all 4 extremities. No evidence of extinction is noted.  Coordination: Cerebellar testing reveals good finger-nose-finger and heel-to-shin bilaterally.  Mild tremor with finger-nose-finger bilaterally Gait and station: Has to push off from seated position, gait is intact, good stride, mild bilateral foot drop right worse than left Reflexes: Deep tendon reflexes are symmetric and normal bilaterally.   DIAGNOSTIC DATA (LABS, IMAGING, TESTING) - I reviewed patient records, labs, notes, testing and imaging myself where available.  Lab Results  Component Value Date   WBC 7.6 10/24/2017   HGB 11.4 (L) 10/24/2017   HCT 33.9 (L) 10/24/2017   MCV 100.1 (H) 10/24/2017   PLT 251.0 10/24/2017      Component Value Date/Time   NA 136 10/24/2017 1441   K 4.4 10/24/2017 1441   CL 102 10/24/2017 1441   CO2 28 10/24/2017 1441   GLUCOSE 104 (H) 10/24/2017 1441   BUN 18 10/24/2017 1441   CREATININE 0.98 10/24/2017 1441   CALCIUM 9.5 10/24/2017 1441   PROT 7.7 10/24/2017 1441   ALBUMIN 4.2 10/24/2017 1441   AST 28 10/24/2017 1441   ALT 16 10/24/2017 1441   ALKPHOS 31 (L) 10/24/2017 1441   BILITOT 0.4 10/24/2017 1441   GFRNONAA 86 (L) 08/05/2014 0430   GFRAA >90 08/05/2014 0430   Lab Results  Component Value Date   CHOL 137 10/24/2017   HDL 49.60 10/24/2017   LDLCALC 66 10/24/2017   TRIG 105.0 10/24/2017   CHOLHDL 3 10/24/2017   Lab Results  Component Value Date   HGBA1C 5.9 10/24/2017   Lab Results    Component Value Date   VITAMINB12 501 09/20/2015   Lab Results  Component Value Date   TSH 2.30 10/24/2017   MRI of the brain 03/10/2018 IMPRESSION: Slightly abnormal MRI scan of the brain showing only age-appropriate changes of chronic microvascular ischemia and generalized cerebral atrophy.  ASSESSMENT AND PLAN 84 y.o. year old male  has a past medical history of Anemia (2010), Asthma, CAD (coronary artery disease), Depression, Diverticulosis (2004), GERD (gastroesophageal reflux disease), Movement disorder, Other and unspecified hyperlipidemia, RA (rheumatoid arthritis) (HCC), and Unspecified essential hypertension. here with:  1.  Worsening essential tremor -Head titubation, bilateral hands, vocal tremor -Continue primidone 50 mg tablet, 2 tablets 3 times a day -I will check routine lab work while on primidone today   2.  Short-term memory loss -MRI of the brain showed generalized atrophy, small vessel disease 03/10/2018 -We discussed memory medications, he is not interested at this time -MMSE was  26/30, unable to do writing/drawing due to tremor  3.  Depression -Feeling very depressed about his wife being in the nursing home, financial implications -Start Zoloft 25 mg daily -I have asked him to follow-up with his primary care doctor for routine evaluation  -He will follow-up here in 6 months or sooner if needed with Dr. Terrace ArabiaYan (he did not want to return any sooner for revisit)  I spent 25 minutes with the patient. 50% of this time was spent discussing his plan of care.  Margie EgeSarah Jaimie Redditt, AGNP-C, DNP 03/03/2019, 3:58 PM [facility] [address], [address] [place], [place] [address] [phone]

## 2019-03-03 ENCOUNTER — Ambulatory Visit: Payer: Medicare Other | Admitting: Neurology

## 2019-03-03 ENCOUNTER — Other Ambulatory Visit: Payer: Self-pay

## 2019-03-03 ENCOUNTER — Encounter: Payer: Self-pay | Admitting: Neurology

## 2019-03-03 ENCOUNTER — Ambulatory Visit: Payer: Self-pay | Admitting: Adult Health

## 2019-03-03 VITALS — BP 126/66 | HR 68 | Temp 97.7°F | Ht 71.0 in | Wt 178.0 lb

## 2019-03-03 DIAGNOSIS — R413 Other amnesia: Secondary | ICD-10-CM

## 2019-03-03 DIAGNOSIS — G250 Essential tremor: Secondary | ICD-10-CM | POA: Diagnosis not present

## 2019-03-03 MED ORDER — SERTRALINE HCL 25 MG PO TABS: 25.0000 mg | ORAL_TABLET | Freq: Every day | ORAL | 5 refills | Status: AC

## 2019-03-03 MED ORDER — PRIMIDONE 50 MG PO TABS: 100.0000 mg | ORAL_TABLET | Freq: Three times a day (TID) | ORAL | 4 refills | Status: AC

## 2019-03-03 NOTE — Patient Instructions (Signed)
1. Start taking Zoloft 25 mg daily for depression  2. See your primary care doctor for routine care 3. I will check lab work today 4. Continue current dose of primidone  5. Return in 4 months to see Dr. Krista Blue

## 2019-03-04 NOTE — Progress Notes (Signed)
I have reviewed and agreed above plan. 

## 2019-03-05 ENCOUNTER — Telehealth: Payer: Self-pay | Admitting: *Deleted

## 2019-03-05 LAB — CBC WITH DIFFERENTIAL/PLATELET
Basophils Absolute: 0 10*3/uL (ref 0.0–0.2)
Basos: 0 %
EOS (ABSOLUTE): 0.1 10*3/uL (ref 0.0–0.4)
Eos: 1 %
Hematocrit: 32.7 % — ABNORMAL LOW (ref 37.5–51.0)
Hemoglobin: 11.5 g/dL — ABNORMAL LOW (ref 13.0–17.7)
Immature Grans (Abs): 0.1 10*3/uL (ref 0.0–0.1)
Immature Granulocytes: 1 %
Lymphocytes Absolute: 1.5 10*3/uL (ref 0.7–3.1)
Lymphs: 17 %
MCH: 34.1 pg — ABNORMAL HIGH (ref 26.6–33.0)
MCHC: 35.2 g/dL (ref 31.5–35.7)
MCV: 97 fL (ref 79–97)
Monocytes Absolute: 1.1 10*3/uL — ABNORMAL HIGH (ref 0.1–0.9)
Monocytes: 12 %
Neutrophils Absolute: 6.2 10*3/uL (ref 1.4–7.0)
Neutrophils: 69 %
Platelets: 253 10*3/uL (ref 150–450)
RBC: 3.37 x10E6/uL — ABNORMAL LOW (ref 4.14–5.80)
RDW: 13 % (ref 11.6–15.4)
WBC: 9 10*3/uL (ref 3.4–10.8)

## 2019-03-05 LAB — COMPREHENSIVE METABOLIC PANEL
ALT: 14 IU/L (ref 0–44)
AST: 25 IU/L (ref 0–40)
Albumin/Globulin Ratio: 1.4 (ref 1.2–2.2)
Albumin: 4.3 g/dL (ref 3.6–4.6)
Alkaline Phosphatase: 44 IU/L (ref 39–117)
BUN/Creatinine Ratio: 17 (ref 10–24)
BUN: 17 mg/dL (ref 8–27)
Bilirubin Total: 0.3 mg/dL (ref 0.0–1.2)
CO2: 22 mmol/L (ref 20–29)
Calcium: 9.6 mg/dL (ref 8.6–10.2)
Chloride: 98 mmol/L (ref 96–106)
Creatinine, Ser: 1 mg/dL (ref 0.76–1.27)
GFR calc Af Amer: 81 mL/min/{1.73_m2} (ref >59)
GFR calc non Af Amer: 70 mL/min/{1.73_m2} (ref >59)
Globulin, Total: 3.1 g/dL (ref 1.5–4.5)
Glucose: 99 mg/dL (ref 65–99)
Potassium: 4.8 mmol/L (ref 3.5–5.2)
Sodium: 136 mmol/L (ref 134–144)
Total Protein: 7.4 g/dL (ref 6.0–8.5)

## 2019-03-05 LAB — PRIMIDONE, SERUM
Phenobarbital, Serum: 3 ug/mL — ABNORMAL LOW (ref 15–40)
Primidone Lvl: 2.6 ug/mL — ABNORMAL LOW (ref 5.0–12.0)

## 2019-03-05 NOTE — Telephone Encounter (Signed)
Called and spoke with pt. Relayed results per Elvina Sidle note. Pt verbalized understanding.

## 2019-03-05 NOTE — Telephone Encounter (Signed)
-----   Message from Suzzanne Cloud, NP sent at 03/05/2019 10:28 AM EDT ----- Please call the patient. Labs are stable. He has been on same stable dose of primidone for several years. Primidone level is not toxic.

## 2019-03-12 ENCOUNTER — Telehealth: Payer: Self-pay | Admitting: Neurology

## 2019-03-12 NOTE — Telephone Encounter (Signed)
I called the patient.  It sounds as though he is trying to get his wife transferred from [facility] to [facility].  He does have a diagnosis of memory loss, but his last memory score a few weeks ago was 26/30.  He drives a car, perform his own ADLs, and lives alone.  At this point, I do not feel he needs a memory care unit. I am not sure that I can help with his wife since she is not a patient here.

## 2019-03-12 NOTE — Telephone Encounter (Signed)
RN [place] @ [address] has called becuase pt's wife is in a memory care unit.  Pt has confided in [employer] that he feels he may have dementia and he is wanting to go into a facility with his wife.  Pt is wanting  himself and his wife to be at [facility].  Matthew Saras is asking if a referral of some kind can be initiated for him.  RN Matthew Saras is asking if pt is appropriate for memory care

## 2019-04-08 NOTE — Progress Notes (Signed)
Subjective:    Patient ID: Ethan Robinson, male    DOB: 1934/06/18, 84 y.o.   MRN: [id number]  HPI He is here for a physical exam.   He has difficulty urinating in the morning, it is not as bad during the day.  He drinks a lot of water. He denies dysuria and hematuria.  He has a history of BPH and had a TURP years ago.   He has no other concerns.   Medications and allergies reviewed with patient and updated if appropriate.  Patient Active Problem List   Diagnosis Date Noted  . Gait abnormality 02/28/2018  . Low back pain 03/27/2016  . Abnormality of gait 03/27/2016  . Hypothyroidism 09/23/2015  . Mild cognitive impairment 09/20/2015  . Benign prostatic hyperplasia with urinary obstruction 04/10/2014  . Gallstones 01/31/2013  . Back pain, thoracic 01/15/2013  . Right lumbar radiculopathy 01/15/2013  . OA (osteoarthritis) of knee 08/07/2011  . VENTRAL HERNIA 01/11/2010  . Rheumatoid arthritis (HCC) 01/11/2010  . Hypertension 05/04/2009  . PEPTIC ULCER DISEASE 04/29/2009  . COUGH, CHRONIC 04/15/2008  . DYSPHAGIA PHARYNGEAL PHASE 04/15/2008  . Essential tremor 12/19/2007  . Fasting hyperglycemia 12/19/2007  . Hyperlipidemia 07/22/2007  . Macrocytic anemia 07/22/2007  . Coronary atherosclerosis 07/22/2007  . ALLERGIC RHINITIS 07/22/2007  . ASTHMA 07/22/2007  . GERD 07/22/2007  . DEGENERATIVE JOINT DISEASE 07/22/2007    Current Outpatient Medications on File Prior to Visit  Medication Sig Dispense Refill  . aspirin 325 MG tablet Take 325 mg by mouth daily.    . folic acid (FOLVITE) 1 MG tablet Take 1 mg by mouth daily.   3  . hydroxychloroquine (PLAQUENIL) 200 MG tablet     . methotrexate 50 MG/2ML injection Inject 125 mg into the vein once a week.    . primidone (MYSOLINE) 50 MG tablet Take 2 tablets (100 mg total) by mouth 3 (three) times daily. 540 tablet 4  . rosuvastatin (CRESTOR) 20 MG tablet TAKE 1 TABLET BY MOUTH ONCE DAILY AT BEDTIME 90 tablet 3  .  sertraline (ZOLOFT) 25 MG tablet Take 1 tablet (25 mg total) by mouth daily. 30 tablet 5  . sulfaSALAzine (AZULFIDINE) 500 MG tablet Take 1,000 mg by mouth 2 (two) times daily.     No current facility-administered medications on file prior to visit.     Past Medical History:  Diagnosis Date  . Anemia 2010   H/H  12.9/38  . Asthma    hx of   . CAD (coronary artery disease)    Dr Gala Romney  . Depression   . Diverticulosis 2004   [facility]; due 2014  . GERD (gastroesophageal reflux disease)    NO MEDS  . Movement disorder    essential tremors  . Other and unspecified hyperlipidemia   . RA (rheumatoid arthritis) (HCC)    Dr Dierdre Forth  AND OA BOTH KNEES AND HANDS  . Unspecified essential hypertension     Past Surgical History:  Procedure Laterality Date  . ANGIOPLASTY  1997   3 stents  . BACK SURGERY  1995   for ruptured disc LS spine  . CARDIAC CATHETERIZATION     coronary stents x3- placed 20 yrs ago.  Marland Kitchen CARPAL TUNNEL RELEASE     bilateral  . CATARACT EXTRACTION, BILATERAL  2011  . CHOLECYSTECTOMY N/A 02/13/2013   Procedure: LAPAROSCOPIC CHOLECYSTECTOMY WITH INTRAOPERATIVE CHOLANGIOGRAM;  Surgeon: Clovis Pu. Cornett, MD;  Location: [facility] OR;  Service: General;  Laterality: N/A;  .  COLONOSCOPY  2004   Diverticulosis  . KNEE SURGERY     x 2  . post op transfusion  9/14   for anemia  . PROSTATE SURGERY     BPH  . SHOULDER SURGERY  2005  . TONSILLECTOMY    . TOTAL KNEE ARTHROPLASTY  08/07/2011   Procedure: TOTAL KNEE ARTHROPLASTY;  Surgeon: Loanne Drilling, MD;  Location: [facility];  Service: Orthopedics;  Laterality: Right;  . TOTAL KNEE ARTHROPLASTY Left 08/03/2014   Procedure: LEFT TOTAL KNEE ARTHROPLASTY;  Surgeon: Ollen Gross, MD;  Location: [facility];  Service: Orthopedics;  Laterality: Left;  . TRANSURETHRAL RESECTION OF PROSTATE N/A 04/10/2014   Procedure: TRANSURETHRAL RESECTION OF THE PROSTATE WITH GYRUS INSTRUMENTS;  Surgeon: Garnett Farm, MD;  Location: [facility];   Service: Urology;  Laterality: N/A;  . TRIGGER FINGER RELEASE     multiple    Social History   Socioeconomic History  . Marital status: Married    Spouse name: Steward Drone  . Number of children: 1  . Years of education: 26  . Highest education level: Not on file  Occupational History    Comment: retired  Engineer, production  . Financial resource strain: Not hard at all  . Food insecurity    Worry: Never true    Inability: Never true  . Transportation needs    Medical: No    Non-medical: No  Tobacco Use  . Smoking status: Former Smoker    Packs/day: 2.00    Types: Cigarettes    Quit date: 05/29/1972    Years since quitting: 46.8  . Smokeless tobacco: Never Used  . Tobacco comment: smoked 1957-1974, up to 2 ppd  occ alcohol  Substance and Sexual Activity  . Alcohol use: Yes    Alcohol/week: 1.0 standard drinks    Types: 1 Cans of beer per week    Comment: beer rarely  . Drug use: No  . Sexual activity: Never  Lifestyle  . Physical activity    Days per week: 0 days    Minutes per session: 0 min  . Stress: Very much  Relationships  . Social connections    Talks on phone: More than three times a week    Gets together: More than three times a week    Attends religious service: Not on file    Active member of club or organization: Not on file    Attends meetings of clubs or organizations: Not on file    Relationship status: Married  Other Topics Concern  . Not on file  Social History Narrative   Patient lives with his wife Steward Drone).   Patient has 1 child.   Patient has a high school education.   Patient is right-handed.   Patient is retired.      Exercise: no    Family History  Problem Relation Age of Onset  . Cancer Father        esophagus  . Stroke Mother        in mid 10s  . Arthritis Mother   . Diabetes Brother   . Heart attack Paternal Grandmother        ? in 68s  . Diabetes Maternal Aunt   . Prostate cancer Maternal Uncle   . Cancer Maternal Uncle   .  Prostate cancer Cousin   . Cancer Cousin   . Liver cancer Unknown        Paternal family history  . Leukemia Unknown  Paternal family history  . Cancer Cousin     Review of Systems  Constitutional: Negative for chills and fever.  Eyes: Negative for visual disturbance.  Respiratory: Positive for shortness of breath (chronic, no change). Negative for cough and wheezing.   Cardiovascular: Negative for chest pain, palpitations and leg swelling.  Gastrointestinal: Positive for constipation (occ) and diarrhea (occ). Negative for abdominal pain, blood in stool and nausea.       Occ gerd  Genitourinary: Positive for difficulty urinating. Negative for dysuria and hematuria.  Musculoskeletal: Positive for arthralgias (controlled) and back pain (lower back).  Skin: Negative for color change and rash.  Neurological: Negative for light-headedness and headaches.  Psychiatric/Behavioral: Positive for dysphoric mood. The patient is nervous/anxious.        Objective:   Vitals:   04/09/19 1359  BP: 132/60  Pulse: 74  Resp: 16  Temp: 99.5 F (37.5 C)  SpO2: 97%   Filed Weights   04/09/19 1359  Weight: 177 lb (80.3 kg)   Body mass index is 24.69 kg/m.  BP Readings from Last 3 Encounters:  04/09/19 132/60  03/03/19 126/66  02/28/18 (!) 155/77    Wt Readings from Last 3 Encounters:  04/09/19 177 lb (80.3 kg)  03/03/19 178 lb (80.7 kg)  02/28/18 178 lb (80.7 kg)     Physical Exam Constitutional: He appears well-developed and well-nourished. No distress.  HENT:  Head: Normocephalic and atraumatic.  Right Ear: External ear normal.  Left Ear: External ear normal.  Mouth/Throat: Oropharynx is clear and moist.  Normal ear canals and TM b/l  Eyes: Conjunctivae and EOM are normal.  Neck: Neck supple. No tracheal deviation present. No thyromegaly present.  No carotid bruit  Cardiovascular: Normal rate, regular rhythm, normal heart sounds and intact distal pulses.   No murmur  heard. Pulmonary/Chest: Effort normal and breath sounds normal. No respiratory distress. He has no wheezes. He has no rales.  Abdominal: Soft. He exhibits no distension. There is no tenderness.  Genitourinary: deferred  Musculoskeletal: He exhibits no edema.  Lymphadenopathy:   He has no cervical adenopathy.  Skin: Skin is warm and dry. He is not diaphoretic.  Psychiatric: He has a normal mood and affect. His behavior is normal.         Assessment & Plan:   Physical exam: Screening blood work  ordered Immunizations  All up to date Colonoscopy   N/a -aged out Eye exams   Due  - will schedule Exercise   none Weight  BMI normal Substance abuse   none  See Problem List for Assessment and Plan of chronic medical problems.   FU in one year

## 2019-04-08 NOTE — Patient Instructions (Addendum)
Tests ordered today. Your results will be released to MyChart (or called to you) after review.  If any changes need to be made, you will be notified at that same time.  All other Health Maintenance issues reviewed.   All recommended immunizations and age-appropriate screenings are up-to-date or discussed.  No immunization administered today.   Medications reviewed and updated.  Changes include :   none  Your prescription(s) have been submitted to your pharmacy. Please take as directed and contact our office if you believe you are having problem(s) with the medication(s).   Please followup in 1 year    Health Maintenance, Male Adopting a healthy lifestyle and getting preventive care are important in promoting health and wellness. Ask your health care provider about:  The right schedule for you to have regular tests and exams.  Things you can do on your own to prevent diseases and keep yourself healthy. What should I know about diet, weight, and exercise? Eat a healthy diet   Eat a diet that includes plenty of vegetables, fruits, low-fat dairy products, and lean protein.  Do not eat a lot of foods that are high in solid fats, added sugars, or sodium. Maintain a healthy weight Body mass index (BMI) is a measurement that can be used to identify possible weight problems. It estimates body fat based on height and weight. Your health care provider can help determine your BMI and help you achieve or maintain a healthy weight. Get regular exercise Get regular exercise. This is one of the most important things you can do for your health. Most adults should:  Exercise for at least 150 minutes each week. The exercise should increase your heart rate and make you sweat (moderate-intensity exercise).  Do strengthening exercises at least twice a week. This is in addition to the moderate-intensity exercise.  Spend less time sitting. Even light physical activity can be beneficial. Watch  cholesterol and blood lipids Have your blood tested for lipids and cholesterol at 82 years of age, then have this test every 5 years. You may need to have your cholesterol levels checked more often if:  Your lipid or cholesterol levels are high.  You are older than 82 years of age.  You are at high risk for heart disease. What should I know about cancer screening? Many types of cancers can be detected early and may often be prevented. Depending on your health history and family history, you may need to have cancer screening at various ages. This may include screening for:  Colorectal cancer.  Prostate cancer.  Skin cancer.  Lung cancer. What should I know about heart disease, diabetes, and high blood pressure? Blood pressure and heart disease  High blood pressure causes heart disease and increases the risk of stroke. This is more likely to develop in people who have high blood pressure readings, are of African descent, or are overweight.  Talk with your health care provider about your target blood pressure readings.  Have your blood pressure checked: ? Every 3-5 years if you are 18-39 years of age. ? Every year if you are 40 years old or older.  If you are between the ages of 65 and 75 and are a current or former smoker, ask your health care provider if you should have a one-time screening for abdominal aortic aneurysm (AAA). Diabetes Have regular diabetes screenings. This checks your fasting blood sugar level. Have the screening done:  Once every three years after age 45 if you are at   a normal weight and have a low risk for diabetes.  More often and at a younger age if you are overweight or have a high risk for diabetes. What should I know about preventing infection? Hepatitis B If you have a higher risk for hepatitis B, you should be screened for this virus. Talk with your health care provider to find out if you are at risk for hepatitis B infection. Hepatitis C Blood  testing is recommended for:  Everyone born from 1945 through 1965.  Anyone with known risk factors for hepatitis C. Sexually transmitted infections (STIs)  You should be screened each year for STIs, including gonorrhea and chlamydia, if: ? You are sexually active and are younger than 82 years of age. ? You are older than 82 years of age and your health care provider tells you that you are at risk for this type of infection. ? Your sexual activity has changed since you were last screened, and you are at increased risk for chlamydia or gonorrhea. Ask your health care provider if you are at risk.  Ask your health care provider about whether you are at high risk for HIV. Your health care provider may recommend a prescription medicine to help prevent HIV infection. If you choose to take medicine to prevent HIV, you should first get tested for HIV. You should then be tested every 3 months for as long as you are taking the medicine. Follow these instructions at home: Lifestyle  Do not use any products that contain nicotine or tobacco, such as cigarettes, e-cigarettes, and chewing tobacco. If you need help quitting, ask your health care provider.  Do not use street drugs.  Do not share needles.  Ask your health care provider for help if you need support or information about quitting drugs. Alcohol use  Do not drink alcohol if your health care provider tells you not to drink.  If you drink alcohol: ? Limit how much you have to 0-2 drinks a day. ? Be aware of how much alcohol is in your drink. In the [place] one drink equals one 12 oz bottle of beer (355 mL), one 5 oz glass of wine (148 mL), or one 1 oz glass of hard liquor (44 mL). General instructions  Schedule regular health, dental, and eye exams.  Stay current with your vaccines.  Tell your health care provider if: ? You often feel depressed. ? You have ever been abused or do not feel safe at home. Summary  Adopting a healthy  lifestyle and getting preventive care are important in promoting health and wellness.  Follow your health care provider's instructions about healthy diet, exercising, and getting tested or screened for diseases.  Follow your health care provider's instructions on monitoring your cholesterol and blood pressure. This information is not intended to replace advice given to you by your health care provider. Make sure you discuss any questions you have with your health care provider. Document Released: 11/11/2007 Document Revised: 05/08/2018 Document Reviewed: 05/08/2018 Elsevier Patient Education  2020 [employer]  

## 2019-04-09 ENCOUNTER — Ambulatory Visit (INDEPENDENT_AMBULATORY_CARE_PROVIDER_SITE_OTHER): Payer: Medicare Other | Admitting: Internal Medicine

## 2019-04-09 ENCOUNTER — Encounter: Payer: Self-pay | Admitting: Internal Medicine

## 2019-04-09 ENCOUNTER — Other Ambulatory Visit: Payer: Self-pay

## 2019-04-09 ENCOUNTER — Other Ambulatory Visit (INDEPENDENT_AMBULATORY_CARE_PROVIDER_SITE_OTHER): Payer: Medicare Other

## 2019-04-09 VITALS — BP 132/60 | HR 74 | Temp 99.5°F | Resp 16 | Ht 71.0 in | Wt 177.0 lb

## 2019-04-09 DIAGNOSIS — I2510 Atherosclerotic heart disease of native coronary artery without angina pectoris: Secondary | ICD-10-CM

## 2019-04-09 DIAGNOSIS — E038 Other specified hypothyroidism: Secondary | ICD-10-CM

## 2019-04-09 DIAGNOSIS — R7301 Impaired fasting glucose: Secondary | ICD-10-CM

## 2019-04-09 DIAGNOSIS — N401 Enlarged prostate with lower urinary tract symptoms: Secondary | ICD-10-CM | POA: Diagnosis not present

## 2019-04-09 DIAGNOSIS — G3184 Mild cognitive impairment, so stated: Secondary | ICD-10-CM

## 2019-04-09 DIAGNOSIS — I10 Essential (primary) hypertension: Secondary | ICD-10-CM

## 2019-04-09 DIAGNOSIS — Z0000 Encounter for general adult medical examination without abnormal findings: Secondary | ICD-10-CM

## 2019-04-09 DIAGNOSIS — G250 Essential tremor: Secondary | ICD-10-CM

## 2019-04-09 DIAGNOSIS — E782 Mixed hyperlipidemia: Secondary | ICD-10-CM | POA: Diagnosis not present

## 2019-04-09 DIAGNOSIS — N138 Other obstructive and reflux uropathy: Secondary | ICD-10-CM

## 2019-04-09 DIAGNOSIS — M069 Rheumatoid arthritis, unspecified: Secondary | ICD-10-CM

## 2019-04-09 LAB — COMPREHENSIVE METABOLIC PANEL
ALT: 15 U/L (ref 0–53)
AST: 23 U/L (ref 0–37)
Albumin: 4.4 g/dL (ref 3.5–5.2)
Alkaline Phosphatase: 37 U/L — ABNORMAL LOW (ref 39–117)
BUN: 13 mg/dL (ref 6–23)
CO2: 26 mEq/L (ref 19–32)
Calcium: 9.3 mg/dL (ref 8.4–10.5)
Chloride: 100 mEq/L (ref 96–112)
Creatinine, Ser: 0.9 mg/dL (ref 0.40–1.50)
GFR: 80.71 mL/min (ref >60.00)
Glucose, Bld: 99 mg/dL (ref 70–99)
Potassium: 4.5 mEq/L (ref 3.5–5.1)
Sodium: 132 mEq/L — ABNORMAL LOW (ref 135–145)
Total Bilirubin: 0.4 mg/dL (ref 0.2–1.2)
Total Protein: 7.1 g/dL (ref 6.0–8.3)

## 2019-04-09 LAB — CBC WITH DIFFERENTIAL/PLATELET
Basophils Absolute: 0.1 10*3/uL (ref 0.0–0.1)
Basophils Relative: 0.6 % (ref 0.0–3.0)
Eosinophils Absolute: 0.1 10*3/uL (ref 0.0–0.7)
Eosinophils Relative: 1.7 % (ref 0.0–5.0)
HCT: 31.8 % — ABNORMAL LOW (ref 39.0–52.0)
Hemoglobin: 10.8 g/dL — ABNORMAL LOW (ref 13.0–17.0)
Lymphocytes Relative: 18.4 % (ref 12.0–46.0)
Lymphs Abs: 1.6 10*3/uL (ref 0.7–4.0)
MCHC: 34 g/dL (ref 30.0–36.0)
MCV: 101.3 fl — ABNORMAL HIGH (ref 78.0–100.0)
Monocytes Absolute: 1 10*3/uL (ref 0.1–1.0)
Monocytes Relative: 11.6 % (ref 3.0–12.0)
Neutro Abs: 6 10*3/uL (ref 1.4–7.7)
Neutrophils Relative %: 67.7 % (ref 43.0–77.0)
Platelets: 261 10*3/uL (ref 150.0–400.0)
RBC: 3.14 Mil/uL — ABNORMAL LOW (ref 4.22–5.81)
RDW: 14 % (ref 11.5–15.5)
WBC: 8.9 10*3/uL (ref 4.0–10.5)

## 2019-04-09 LAB — LIPID PANEL
Cholesterol: 137 mg/dL (ref 0–200)
HDL: 60.2 mg/dL (ref >39.00)
LDL Cholesterol: 62 mg/dL (ref 0–99)
NonHDL: 77.28
Total CHOL/HDL Ratio: 2
Triglycerides: 76 mg/dL (ref 0.0–149.0)
VLDL: 15.2 mg/dL (ref 0.0–40.0)

## 2019-04-09 LAB — PSA, MEDICARE: PSA: 0.22 ng/ml (ref 0.10–4.00)

## 2019-04-09 LAB — TSH: TSH: 11.86 u[IU]/mL — ABNORMAL HIGH (ref 0.35–4.50)

## 2019-04-09 LAB — HEMOGLOBIN A1C: Hgb A1c MFr Bld: 5.7 % (ref 4.6–6.5)

## 2019-04-09 MED ORDER — METOPROLOL SUCCINATE ER 25 MG PO TB24: 25.0000 mg | ORAL_TABLET | Freq: Every day | ORAL | 3 refills | Status: AC

## 2019-04-09 NOTE — Assessment & Plan Note (Signed)
Following with neurology 

## 2019-04-09 NOTE — Assessment & Plan Note (Signed)
Following with Dr Amil Amen Arthritis controlled

## 2019-04-09 NOTE — Assessment & Plan Note (Signed)
S/o TURP years ago Having difficulty urinating Likely has recurrent obstructive urinary symptoms Deferred referral to urology at this time Will check psa

## 2019-04-09 NOTE — Assessment & Plan Note (Signed)
No CP, palps Continue current medications

## 2019-04-09 NOTE — Assessment & Plan Note (Signed)
BP well controlled Current regimen effective and well tolerated Continue current medications at current doses cmp  

## 2019-04-09 NOTE — Assessment & Plan Note (Signed)
Following with neuro On primidone

## 2019-04-09 NOTE — Assessment & Plan Note (Signed)
a1c

## 2019-04-09 NOTE — Assessment & Plan Note (Signed)
Check lipid panel  Regular exercise and healthy diet encouraged  

## 2019-04-09 NOTE — Assessment & Plan Note (Signed)
Has not been taking his medication - he is unsure why   Check tsh  Titrate med dose if needed

## 2019-04-11 MED ORDER — LEVOTHYROXINE SODIUM 50 MCG PO TABS: 50.0000 ug | ORAL_TABLET | Freq: Every day | ORAL | 1 refills | Status: DC

## 2019-05-20 ENCOUNTER — Other Ambulatory Visit (INDEPENDENT_AMBULATORY_CARE_PROVIDER_SITE_OTHER): Payer: Medicare Other

## 2019-05-20 DIAGNOSIS — E038 Other specified hypothyroidism: Secondary | ICD-10-CM

## 2019-05-20 LAB — TSH: TSH: 7.29 u[IU]/mL — ABNORMAL HIGH (ref 0.35–4.50)

## 2019-05-26 ENCOUNTER — Other Ambulatory Visit: Payer: Self-pay | Admitting: Internal Medicine

## 2019-05-26 ENCOUNTER — Ambulatory Visit: Payer: Self-pay

## 2019-05-26 DIAGNOSIS — E038 Other specified hypothyroidism: Secondary | ICD-10-CM

## 2019-05-26 MED ORDER — LEVOTHYROXINE SODIUM 75 MCG PO TABS: 75.0000 ug | ORAL_TABLET | Freq: Every day | ORAL | 3 refills | Status: AC

## 2019-05-26 NOTE — Telephone Encounter (Signed)
Patient returned call to office for lab results and after receiving the result he states he needed an appointment for his swollen feet. He states that this started a couple days ago and is at the point it is hard to get his bedroom shoes to fit. He states that he has a toe on his left foot that is extremely sore. He rates the pain at 9-10. He has not injured his toe. It is not red. He feels this may be due to his RA. He has no fever. Care advice read to patient he verbalized understanding of all information. Call transferred to office for scheduling. Please be aware patient needs new RX per lab note 12/26   Reason for Disposition . [1] Very swollen joint AND [2] no fever  Answer Assessment - Initial Assessment Questions 1. LOCATION: "Which joint is swollen?"     Both feet 2. ONSET: "When did the swelling start?"   About 2 days ago 3. SIZE: "How large is the swelling?"    Bedroom  shoes are tight 4. PAIN: "Is there any pain?" If so, ask: "How bad is it?" (Scale 1-10; or mild, moderate, severe)     Limp when walking rt foot middle toe 10 5. CAUSE: "What do you think caused the swollen joint?"    No RA possible 6. OTHER SYMPTOMS: "Do you have any other symptoms?" (e.g., fever, chest pain, difficulty breathing, calf pain)    no 7. PREGNANCY: "Is there any chance you are pregnant?" "When was your last menstrual period?"    N/A  Protocols used: ANKLE SWELLING-A-AH

## 2019-05-27 ENCOUNTER — Ambulatory Visit (INDEPENDENT_AMBULATORY_CARE_PROVIDER_SITE_OTHER): Payer: Medicare Other | Admitting: Internal Medicine

## 2019-05-27 ENCOUNTER — Other Ambulatory Visit: Payer: Self-pay

## 2019-05-27 ENCOUNTER — Encounter: Payer: Self-pay | Admitting: Internal Medicine

## 2019-05-27 VITALS — BP 124/82 | HR 83 | Temp 97.9°F | Ht 71.0 in | Wt 184.0 lb

## 2019-05-27 DIAGNOSIS — M79674 Pain in right toe(s): Secondary | ICD-10-CM

## 2019-05-27 DIAGNOSIS — M7989 Other specified soft tissue disorders: Secondary | ICD-10-CM | POA: Diagnosis not present

## 2019-05-27 DIAGNOSIS — M069 Rheumatoid arthritis, unspecified: Secondary | ICD-10-CM

## 2019-05-27 MED ORDER — METHYLPREDNISOLONE ACETATE 40 MG/ML IJ SUSP
40.0000 mg | Freq: Once | INTRAMUSCULAR | Status: AC
  Administered 2019-05-27: 40 mg via INTRAMUSCULAR

## 2019-05-27 NOTE — Patient Instructions (Signed)
We are checking labs and have given you a shot that might help with the toe pain.

## 2019-05-27 NOTE — Progress Notes (Signed)
   Subjective:   Patient ID: Ethan Robinson, male    DOB: [birth year], 84 y.o.   MRN: [id number]  HPI The patient is an 84 YO man coming in for several concerns including foot swelling (happens most days, today is about typical, denies worsening throughout the day, denies better in the morning but he is not sure, he does not recall when this started could be weeks or months or years, has not tried anything for this, denies any change to medications or diet recently) and pain right toe (started sometime recently, has not brought this to anyone's attention, has RA and not sure if this is a flare, denies trying anything for pain for this, is keeping him from walking some).   Review of Systems  Constitutional: Negative.   HENT: Negative.   Eyes: Negative.   Respiratory: Negative for cough, chest tightness and shortness of breath.   Cardiovascular: Negative for chest pain, palpitations and leg swelling.  Gastrointestinal: Negative for abdominal distention, abdominal pain, constipation, diarrhea, nausea and vomiting.  Musculoskeletal: Positive for arthralgias, gait problem, joint swelling and myalgias.  Skin: Negative.   Neurological: Negative for dizziness, seizures and numbness.  Psychiatric/Behavioral: Negative.     Objective:  Physical Exam Constitutional:      Appearance: He is well-developed.  HENT:     Head: Normocephalic and atraumatic.  Cardiovascular:     Rate and Rhythm: Normal rate and regular rhythm.  Pulmonary:     Effort: Pulmonary effort is normal. No respiratory distress.     Breath sounds: Normal breath sounds. No wheezing or rales.  Abdominal:     General: Bowel sounds are normal. There is no distension.     Palpations: Abdomen is soft.     Tenderness: There is no abdominal tenderness. There is no rebound.  Musculoskeletal:        General: Swelling and tenderness present.     Cervical back: Normal range of motion.     Comments: 1+ edema in the foot and ankle  bilaterally which is pitting, right toe with pain but no swelling or redness  Skin:    General: Skin is warm and dry.  Neurological:     Mental Status: He is alert and oriented to person, place, and time.     Coordination: Coordination normal.     Vitals:   05/27/19 1346  BP: 124/82  Pulse: 83  Temp: 97.9 F (36.6 C)  TempSrc: Oral  SpO2: 92%  Weight: 184 lb (83.5 kg)  Height: 5\' 11"  (1.803 m)    This visit occurred during the SARS-CoV-2 public health emergency.  Safety protocols were in place, including screening questions prior to the visit, additional usage of staff PPE, and extensive cleaning of exam room while observing appropriate contact time as indicated for disinfecting solutions.   Assessment & Plan:  Depo-medrol 40 mg IM given at visit  Visit time 25 minutes: greater than 50% of that time was spent in face to face counseling and coordination of care with the patient: counseled about pain in foot, RA relationship, swelling in feet and ankles causes and treatments

## 2019-05-27 NOTE — Assessment & Plan Note (Signed)
Given depo-medrol 40 mg IM today to see if this could represent RA flare in the toe. No known injury. If no improvement may need further evaluation.

## 2019-05-27 NOTE — Assessment & Plan Note (Addendum)
Checking CMP and BNP for cause. Prior labs fairly normal. Unclear onset. Exam with mild swelling. If no clear cause would recommend increasing fluids, activity, and compression stockings.

## 2019-05-28 NOTE — Assessment & Plan Note (Signed)
Potential flare with the toe pain and given depo-medrol 40 mg IM. If no improvement follow up with rheumatology, sports medicine, or PCP.

## 2019-09-01 ENCOUNTER — Telehealth: Payer: Self-pay | Admitting: *Deleted

## 2019-09-01 ENCOUNTER — Ambulatory Visit: Payer: Medicare Other | Admitting: Neurology

## 2019-09-01 ENCOUNTER — Encounter: Payer: Self-pay | Admitting: Neurology

## 2019-09-01 NOTE — Telephone Encounter (Signed)
No showed follow up appointment. 

## 2019-09-25 ENCOUNTER — Telehealth: Payer: Self-pay | Admitting: Internal Medicine

## 2019-09-25 NOTE — Progress Notes (Signed)
  Chronic Care Management   Outreach Note  09/25/2019 Name: Ethan Robinson MRN: [id number] DOB: [birth year]  Referred by: Pincus Sanes, MD Reason for referral : No chief complaint on file.   An unsuccessful telephone outreach was attempted today. The patient was referred to the pharmacist for assistance with care management and care coordination.    This note is not being shared with the patient for the following reason: To respect privacy (The patient or proxy has requested that the information not be shared).  Follow Up Plan:   Raynicia Dukes [employer] Scheduler

## 2019-11-07 ENCOUNTER — Telehealth: Payer: Self-pay | Admitting: Internal Medicine

## 2019-11-07 NOTE — Progress Notes (Signed)
  Chronic Care Management   Outreach Note  11/07/2019 Name: Ethan Robinson MRN: [id number] DOB: 01/30/1935  Referred by: Pincus Sanes, MD Reason for referral : No chief complaint on file.   An unsuccessful telephone outreach was attempted today. The patient was referred to the pharmacist for assistance with care management and care coordination. This note is not being shared with the patient for the following reason: To respect privacy (The patient or proxy has requested that the information not be shared).  Follow Up Plan:   Lynnae January [employer]

## 2019-11-25 ENCOUNTER — Telehealth: Payer: Self-pay | Admitting: Internal Medicine

## 2019-11-25 NOTE — Progress Notes (Signed)
  Chronic Care Management   Outreach Note  11/25/2019 Name: Ethan Robinson MRN: [id number] DOB: [birth year]  Referred by: Pincus Sanes, MD Reason for referral : No chief complaint on file.   An unsuccessful telephone outreach was attempted today. The patient was referred to the pharmacist for assistance with care management and care coordination. This note is not being shared with the patient for the following reason: To respect privacy (The patient or proxy has requested that the information not be shared).  Follow Up Plan:   Lynnae January [employer] [facility]

## 2024-06-29 DEATH — deceased
